# Patient Record
Sex: Female | Born: 1940
Health system: Southern US, Community
[De-identification: ages and names within clinical notes are randomized; demographics above are authoritative.]

## PROBLEM LIST (undated history)

## (undated) DIAGNOSIS — I773 Arterial fibromuscular dysplasia: Secondary | ICD-10-CM

## (undated) DIAGNOSIS — IMO0002 Reserved for concepts with insufficient information to code with codable children: Secondary | ICD-10-CM

## (undated) DIAGNOSIS — N943 Premenstrual tension syndrome: Secondary | ICD-10-CM

## (undated) DIAGNOSIS — H3561 Retinal hemorrhage, right eye: Secondary | ICD-10-CM

## (undated) DIAGNOSIS — M1990 Unspecified osteoarthritis, unspecified site: Secondary | ICD-10-CM

## (undated) DIAGNOSIS — I10 Essential (primary) hypertension: Secondary | ICD-10-CM

## (undated) HISTORY — DX: Retinal hemorrhage, right eye: H35.61

## (undated) HISTORY — PX: ANGIOPLASTY: SHX39

## (undated) HISTORY — DX: Essential (primary) hypertension: I10

## (undated) HISTORY — DX: Unspecified osteoarthritis, unspecified site: M19.90

## (undated) HISTORY — PX: TONSILLECTOMY AND ADENOIDECTOMY: SUR1326

## (undated) HISTORY — PX: OTHER SURGICAL HISTORY: SHX169

## (undated) HISTORY — DX: Premenstrual tension syndrome: N94.3

## (undated) HISTORY — PX: HEMORRHOID SURGERY: SHX153

## (undated) HISTORY — DX: Reserved for concepts with insufficient information to code with codable children: IMO0002

---

## 1998-09-26 ENCOUNTER — Ambulatory Visit (HOSPITAL_COMMUNITY): Admission: RE | Admit: 1998-09-26 | Discharge: 1998-09-26 | Payer: Self-pay | Admitting: Internal Medicine

## 1999-05-26 ENCOUNTER — Other Ambulatory Visit: Admission: RE | Admit: 1999-05-26 | Discharge: 1999-05-26 | Payer: Self-pay | Admitting: Family Medicine

## 2000-05-25 ENCOUNTER — Other Ambulatory Visit: Admission: RE | Admit: 2000-05-25 | Discharge: 2000-05-25 | Payer: Self-pay | Admitting: Family Medicine

## 2001-07-14 ENCOUNTER — Other Ambulatory Visit: Admission: RE | Admit: 2001-07-14 | Discharge: 2001-07-14 | Payer: Self-pay | Admitting: Family Medicine

## 2002-08-04 ENCOUNTER — Other Ambulatory Visit: Admission: RE | Admit: 2002-08-04 | Discharge: 2002-08-04 | Payer: Self-pay | Admitting: Family Medicine

## 2003-08-27 ENCOUNTER — Other Ambulatory Visit: Admission: RE | Admit: 2003-08-27 | Discharge: 2003-08-27 | Payer: Self-pay | Admitting: Family Medicine

## 2003-09-25 ENCOUNTER — Encounter: Admission: RE | Admit: 2003-09-25 | Discharge: 2003-09-25 | Payer: Self-pay | Admitting: Family Medicine

## 2004-07-29 ENCOUNTER — Ambulatory Visit: Payer: Self-pay | Admitting: Family Medicine

## 2004-08-14 ENCOUNTER — Ambulatory Visit: Payer: Self-pay | Admitting: Family Medicine

## 2004-08-14 ENCOUNTER — Other Ambulatory Visit: Admission: RE | Admit: 2004-08-14 | Discharge: 2004-08-14 | Payer: Self-pay | Admitting: Family Medicine

## 2004-08-19 ENCOUNTER — Ambulatory Visit: Payer: Self-pay | Admitting: Internal Medicine

## 2005-08-19 ENCOUNTER — Ambulatory Visit: Payer: Self-pay | Admitting: Family Medicine

## 2005-08-19 ENCOUNTER — Other Ambulatory Visit: Admission: RE | Admit: 2005-08-19 | Discharge: 2005-08-19 | Payer: Self-pay | Admitting: Family Medicine

## 2005-08-19 ENCOUNTER — Encounter: Payer: Self-pay | Admitting: Family Medicine

## 2005-09-30 ENCOUNTER — Ambulatory Visit (HOSPITAL_BASED_OUTPATIENT_CLINIC_OR_DEPARTMENT_OTHER): Admission: RE | Admit: 2005-09-30 | Discharge: 2005-10-01 | Payer: Self-pay | Admitting: *Deleted

## 2006-09-14 ENCOUNTER — Other Ambulatory Visit: Admission: RE | Admit: 2006-09-14 | Discharge: 2006-09-14 | Payer: Self-pay | Admitting: Family Medicine

## 2006-09-14 ENCOUNTER — Encounter: Payer: Self-pay | Admitting: Family Medicine

## 2006-09-14 ENCOUNTER — Ambulatory Visit: Payer: Self-pay | Admitting: Family Medicine

## 2006-09-14 LAB — CONVERTED CEMR LAB
ALT: 19 units/L (ref 0–40)
Albumin: 4.1 g/dL (ref 3.5–5.2)
BUN: 11 mg/dL (ref 6–23)
Basophils Relative: 0.1 % (ref 0.0–1.0)
CO2: 29 meq/L (ref 19–32)
Calcium: 10.4 mg/dL (ref 8.4–10.5)
Chloride: 110 meq/L (ref 96–112)
Creatinine, Ser: 0.8 mg/dL (ref 0.4–1.2)
Eosinophils Absolute: 0.1 10*3/uL (ref 0.0–0.6)
Hemoglobin: 14.5 g/dL (ref 12.0–15.0)
Hgb A1c MFr Bld: 5.3 % (ref 4.6–6.0)
LDL Cholesterol: 95 mg/dL (ref 0–99)
MCHC: 35.6 g/dL (ref 30.0–36.0)
MCV: 90.3 fL (ref 78.0–100.0)
Monocytes Relative: 5.4 % (ref 3.0–11.0)
Neutro Abs: 3.4 10*3/uL (ref 1.4–7.7)
RDW: 11.9 % (ref 11.5–14.6)
Sodium: 144 meq/L (ref 135–145)
TSH: 1.31 microintl units/mL (ref 0.35–5.50)
Triglycerides: 167 mg/dL — ABNORMAL HIGH (ref 0–149)

## 2007-02-24 DIAGNOSIS — M1990 Unspecified osteoarthritis, unspecified site: Secondary | ICD-10-CM | POA: Insufficient documentation

## 2007-02-24 DIAGNOSIS — J309 Allergic rhinitis, unspecified: Secondary | ICD-10-CM | POA: Insufficient documentation

## 2007-02-24 DIAGNOSIS — M159 Polyosteoarthritis, unspecified: Secondary | ICD-10-CM | POA: Insufficient documentation

## 2007-09-29 ENCOUNTER — Encounter: Payer: Self-pay | Admitting: Family Medicine

## 2007-09-29 ENCOUNTER — Other Ambulatory Visit: Admission: RE | Admit: 2007-09-29 | Discharge: 2007-09-29 | Payer: Self-pay | Admitting: Family Medicine

## 2007-09-29 ENCOUNTER — Ambulatory Visit: Payer: Self-pay | Admitting: Family Medicine

## 2007-09-29 DIAGNOSIS — M818 Other osteoporosis without current pathological fracture: Secondary | ICD-10-CM

## 2007-09-29 DIAGNOSIS — I10 Essential (primary) hypertension: Secondary | ICD-10-CM | POA: Insufficient documentation

## 2007-09-29 DIAGNOSIS — M8580 Other specified disorders of bone density and structure, unspecified site: Secondary | ICD-10-CM | POA: Insufficient documentation

## 2007-09-29 DIAGNOSIS — M810 Age-related osteoporosis without current pathological fracture: Secondary | ICD-10-CM | POA: Insufficient documentation

## 2007-09-29 LAB — CONVERTED CEMR LAB
ALT: 16 units/L (ref 0–35)
Albumin: 4.5 g/dL (ref 3.5–5.2)
Alkaline Phosphatase: 57 units/L (ref 39–117)
BUN: 17 mg/dL (ref 6–23)
Basophils Relative: 1 % (ref 0.0–1.0)
Bilirubin Urine: NEGATIVE
Bilirubin, Direct: 0.2 mg/dL (ref 0.0–0.3)
Cholesterol: 187 mg/dL (ref 0–200)
Creatinine, Ser: 0.8 mg/dL (ref 0.4–1.2)
Eosinophils Absolute: 0.1 10*3/uL (ref 0.0–0.6)
Eosinophils Relative: 2.4 % (ref 0.0–5.0)
Glucose, Urine, Semiquant: NEGATIVE
HDL: 44.5 mg/dL (ref >39.0)
Ketones, urine, test strip: NEGATIVE
LDL Cholesterol: 122 mg/dL — ABNORMAL HIGH (ref 0–99)
Monocytes Absolute: 0.3 10*3/uL (ref 0.2–0.7)
Monocytes Relative: 8 % (ref 3.0–11.0)
Neutro Abs: 2.5 10*3/uL (ref 1.4–7.7)
Neutrophils Relative %: 62.2 % (ref 43.0–77.0)
Nitrite: NEGATIVE
Platelets: 240 10*3/uL (ref 150–400)
Protein, U semiquant: NEGATIVE
RDW: 11.8 % (ref 11.5–14.6)
Total CHOL/HDL Ratio: 4.2
Total Protein: 6.6 g/dL (ref 6.0–8.3)
Triglycerides: 104 mg/dL (ref 0–149)

## 2007-12-12 ENCOUNTER — Encounter: Admission: RE | Admit: 2007-12-12 | Discharge: 2007-12-12 | Payer: Self-pay | Admitting: Family Medicine

## 2008-10-23 ENCOUNTER — Encounter: Payer: Self-pay | Admitting: Family Medicine

## 2008-10-23 ENCOUNTER — Ambulatory Visit: Payer: Self-pay | Admitting: Family Medicine

## 2008-10-23 ENCOUNTER — Other Ambulatory Visit: Admission: RE | Admit: 2008-10-23 | Discharge: 2008-10-23 | Payer: Self-pay | Admitting: Family Medicine

## 2008-10-23 DIAGNOSIS — H3530 Unspecified macular degeneration: Secondary | ICD-10-CM | POA: Insufficient documentation

## 2008-10-23 LAB — CONVERTED CEMR LAB
Ketones, urine, test strip: NEGATIVE
Nitrite: NEGATIVE
Protein, U semiquant: NEGATIVE
Specific Gravity, Urine: 1.02
Urobilinogen, UA: 0.2
WBC Urine, dipstick: NEGATIVE
pH: 5.5

## 2008-11-05 ENCOUNTER — Ambulatory Visit: Payer: Self-pay | Admitting: Family Medicine

## 2008-11-05 LAB — CONVERTED CEMR LAB: PTH: 98 pg/mL — ABNORMAL HIGH (ref 14.0–72.0)

## 2008-11-12 ENCOUNTER — Telehealth: Payer: Self-pay | Admitting: Family Medicine

## 2009-01-10 ENCOUNTER — Encounter: Admission: RE | Admit: 2009-01-10 | Discharge: 2009-01-10 | Payer: Self-pay | Admitting: Family Medicine

## 2009-02-25 LAB — CONVERTED CEMR LAB
ALT: 18 units/L (ref 0–35)
AST: 29 units/L (ref 0–37)
Albumin: 4.6 g/dL (ref 3.5–5.2)
Alkaline Phosphatase: 60 units/L (ref 39–117)
CO2: 32 meq/L (ref 19–32)
Calcium: 11.8 mg/dL — ABNORMAL HIGH (ref 8.4–10.5)
Chloride: 106 meq/L (ref 96–112)
Cholesterol: 185 mg/dL (ref 0–200)
Eosinophils Absolute: 0.1 10*3/uL (ref 0.0–0.7)
GFR calc non Af Amer: 87.97 mL/min (ref >60)
Glucose, Bld: 106 mg/dL — ABNORMAL HIGH (ref 70–99)
HCT: 44.7 % (ref 36.0–46.0)
Hemoglobin: 15.6 g/dL — ABNORMAL HIGH (ref 12.0–15.0)
LDL Cholesterol: 116 mg/dL — ABNORMAL HIGH (ref 0–99)
Lymphocytes Relative: 17.6 % (ref 12.0–46.0)
MCHC: 34.8 g/dL (ref 30.0–36.0)
Monocytes Absolute: 0.4 10*3/uL (ref 0.1–1.0)
Monocytes Relative: 6.2 % (ref 3.0–12.0)
Potassium: 3.9 meq/L (ref 3.5–5.1)
RBC: 4.87 M/uL (ref 3.87–5.11)
TSH: 1.11 microintl units/mL (ref 0.35–5.50)
Total CHOL/HDL Ratio: 4
Total Protein: 7.2 g/dL (ref 6.0–8.3)
VLDL: 25.6 mg/dL (ref 0.0–40.0)

## 2009-10-14 ENCOUNTER — Encounter (INDEPENDENT_AMBULATORY_CARE_PROVIDER_SITE_OTHER): Payer: Self-pay | Admitting: *Deleted

## 2009-10-24 ENCOUNTER — Ambulatory Visit: Payer: Self-pay | Admitting: Family Medicine

## 2009-10-24 ENCOUNTER — Other Ambulatory Visit: Admission: RE | Admit: 2009-10-24 | Discharge: 2009-10-24 | Payer: Self-pay | Admitting: Family Medicine

## 2009-10-24 LAB — CONVERTED CEMR LAB
Bilirubin Urine: NEGATIVE
Glucose, Urine, Semiquant: NEGATIVE
Pap Smear: NEGATIVE
pH: 6.5

## 2009-10-24 LAB — HM PAP SMEAR

## 2009-10-25 LAB — CONVERTED CEMR LAB
AST: 19 units/L (ref 0–37)
BUN: 17 mg/dL (ref 6–23)
Basophils Absolute: 0 10*3/uL (ref 0.0–0.1)
Chloride: 103 meq/L (ref 96–112)
Cholesterol: 187 mg/dL (ref 0–200)
Direct LDL: 119.4 mg/dL
Eosinophils Absolute: 0.1 10*3/uL (ref 0.0–0.7)
Eosinophils Relative: 2.8 % (ref 0.0–5.0)
GFR calc non Af Amer: 65.63 mL/min (ref >60)
HCT: 41.8 % (ref 36.0–46.0)
HDL: 46.8 mg/dL (ref >39.00)
Hemoglobin: 14.6 g/dL (ref 12.0–15.0)
MCHC: 35 g/dL (ref 30.0–36.0)
MCV: 90.7 fL (ref 78.0–100.0)
Platelets: 240 10*3/uL (ref 150.0–400.0)
Potassium: 4.1 meq/L (ref 3.5–5.1)
RBC: 4.61 M/uL (ref 3.87–5.11)
RDW: 12.4 % (ref 11.5–14.6)
Sodium: 143 meq/L (ref 135–145)
Total Bilirubin: 1 mg/dL (ref 0.3–1.2)
Total CHOL/HDL Ratio: 4
Triglycerides: 210 mg/dL — ABNORMAL HIGH (ref 0.0–149.0)
VLDL: 42 mg/dL — ABNORMAL HIGH (ref 0.0–40.0)
WBC: 4.2 10*3/uL — ABNORMAL LOW (ref 4.5–10.5)

## 2009-10-28 ENCOUNTER — Ambulatory Visit: Payer: Self-pay | Admitting: Family Medicine

## 2009-10-28 LAB — CONVERTED CEMR LAB
Calcium, Total (PTH): 9.8 mg/dL (ref 8.4–10.5)
PTH: 107.7 pg/mL — ABNORMAL HIGH (ref 14.0–72.0)

## 2010-02-19 ENCOUNTER — Encounter: Admission: RE | Admit: 2010-02-19 | Discharge: 2010-02-19 | Payer: Self-pay | Admitting: Family Medicine

## 2010-02-19 LAB — HM MAMMOGRAPHY

## 2010-04-02 ENCOUNTER — Encounter (INDEPENDENT_AMBULATORY_CARE_PROVIDER_SITE_OTHER): Payer: Self-pay | Admitting: *Deleted

## 2010-05-05 ENCOUNTER — Encounter (INDEPENDENT_AMBULATORY_CARE_PROVIDER_SITE_OTHER): Payer: Self-pay | Admitting: *Deleted

## 2010-05-07 ENCOUNTER — Ambulatory Visit: Payer: Self-pay | Admitting: Internal Medicine

## 2010-05-21 ENCOUNTER — Ambulatory Visit: Payer: Self-pay | Admitting: Internal Medicine

## 2010-08-17 ENCOUNTER — Encounter: Payer: Self-pay | Admitting: Family Medicine

## 2010-08-26 NOTE — Procedures (Signed)
Summary: Colonoscopy  Patient: Michelle [name] Note: All result statuses are Final unless otherwise noted.  Tests: (1) Colonoscopy (COL)   COL Colonoscopy           DONE     [facility]     [address]     [place], [place]  [address]           COLONOSCOPY PROCEDURE REPORT           PATIENT:  [name], [name]  MR#:  [id number]     BIRTHDATE:  Jun 25, 1941, 69 yrs. old  GENDER:  female     ENDOSCOPIST:  Hedwig Morton. Juanda Chance, MD     REF. BY:  Tinnie Gens A. Tawanna Cooler, M.D.     PROCEDURE DATE:  05/21/2010     PROCEDURE:  Colonoscopy 19147     ASA CLASS:  Class II     INDICATIONS:  adenom. polyp 1999, last colon 2004     MEDICATIONS:   Versed 8 mg, Fentanyl 75 mcg           DESCRIPTION OF PROCEDURE:   After the risks benefits and     alternatives of the procedure were thoroughly explained, informed     consent was obtained.  Digital rectal exam was performed and     revealed no rectal masses.   The LB [id number]-H180AL [id number] endoscope     was introduced through the anus and advanced to the cecum, which     was identified by both the appendix and ileocecal valve, without     limitations.  The quality of the prep was Miralax fair.  The     instrument was then slowly withdrawn as the colon was fully     examined.     <<PROCEDUREIMAGES>>           FINDINGS:  Mild diverticulosis was found (see image5 and image4).     Retroflexed views in the rectum revealed no abnormalities.    The     scope was then withdrawn from the patient and the procedure     completed.           COMPLICATIONS:  None     ENDOSCOPIC IMPRESSION:     1) Mild diverticulosis     RECOMMENDATIONS:     1) high fiber diet     REPEAT EXAM:  No           ______________________________     Hedwig Morton. Juanda Chance, MD           CC:           n.     eSIGNED:   Hedwig Morton. Brodie at 05/21/2010 11:09 AM           Mariann Laster, [id number]  Note: An exclamation mark (!) indicates a result that was not dispersed into the flowsheet. Document  Creation Date: 05/21/2010 11:09 AM _______________________________________________________________________  (1) Order result status: Final Collection or observation date-time: 05/21/2010 11:00 Requested date-time:  Receipt date-time:  Reported date-time:  Referring Physician:   Ordering Physician: Lina Sar [phone]) Specimen Source:  Source: Launa Grill Order Number: [id number] Lab site:

## 2010-08-26 NOTE — Assessment & Plan Note (Signed)
Summary: cpx/ pt will be fasting/mm   Vital Signs:  Patient profile:   70 year old female Menstrual status:  postmenopausal Height:      64 inches Weight:      170 pounds BMI:     29.29 Temp:     98.0 degrees F oral BP sitting:   130 / 84  (left arm) Cuff size:   regular  Vitals Entered By: Kern Reap CMA Duncan Dull) (October 24, 2009 8:35 AM) CC: cpx Is Patient Diabetic? No Pain Assessment Patient in pain? no        CC:  cpx.  History of Present Illness: Michelle [name] is a 71 year old, married female, nonsmoker, who comes in today for evaluation of hypertension, allergic rhinitis, and decide chronic eczema.  Her hypertension is treated with Zestril 5 mg daily and Spiril. 25 mg daily.  She has a history of renal artery stenosis, and many years ago, had a renal artery dilated.  Her BP has been stable on this medication for many years.  She also has underlying allergic rhinitis, for which she takes Zyrtec 10 mg nightly  Jones, uses Lidex cream p.r.n. for eczema.  Visual acuity, right eye, 2020, left eye.  She has significant vision loss.  However, Dr. Ashley Royalty, her ophthalmologist has been treating her with Avastin injectable for the last two years and this is preserved.  Some of the vision in her right eye.  Diagnosis macular degeneration.  Her past medical history, social history, family history reviewed in detail.  No significant changes.  She continues to walk daily.  Her mood is good.  Hearing normal ADLs, normal risk factor fall reviewed negative.  A home safety reviewed.  Height weight and vision.  Normal except for noted above.  We discussed end of life.  No code blue healthcare power of attorney, etc.  Loney Laurence will be done today.  She also gets routine dental care does BSE monthly.  Gets annual mammography.  Colonoscopy normal.  GI, tetanus 2004, Pneumovax 2010 seasonal flu 2000 and she will consider the shingles  Allergies: 1)  ! * Ivp Dye 2)  ! Benadryl  Past  History:  Past medical, surgical, family and social histories (including risk factors) reviewed, and no changes noted (except as noted below).  Past Medical History: Reviewed history from 09/29/2007 and no changes required. PMS Allergic rhinitis Osteoarthritis hypertension right retinal hemorrhage with macular degeneration osteoporosis cystocele childbirth x 3.  One child died in motor vehicle accident  Past Surgical History: Reviewed history from 02/24/2007 and no changes required. CB x 3 Hemorrhoidectomy T&A RT Ovarian cyst removed BTL Angioplasty Colonoscopy-10/30/2002  Family History: Reviewed history from 09/29/2007 and no changes required. Family History Hypertension  Social History: Reviewed history from 09/29/2007 and no changes required. Retired Married Never Smoked Alcohol use-no Drug use-no Regular exercise-yes  Review of Systems      See HPI  Physical Exam  General:  Well-developed,well-nourished,in no acute distress; alert,appropriate and cooperative throughout examination Head:  Normocephalic and atraumatic without obvious abnormalities. No apparent alopecia or balding. Eyes:  No corneal or conjunctival inflammation noted. EOMI. Perrla. Funduscopic exam benign, without hemorrhages, exudates or papilledema. Vision grossly normal. Ears:  External ear exam shows no significant lesions or deformities.  Otoscopic examination reveals clear canals, tympanic membranes are intact bilaterally without bulging, retraction, inflammation or discharge. Hearing is grossly normal bilaterally. Nose:  External nasal examination shows no deformity or inflammation. Nasal mucosa are pink and moist without lesions or exudates. Mouth:  Oral  mucosa and oropharynx without lesions or exudates.  Teeth in good repair. Neck:  No deformities, masses, or tenderness noted. Chest Wall:  No deformities, masses, or tenderness noted. Breasts:  No mass, nodules, thickening, tenderness,  bulging, retraction, inflamation, nipple discharge or skin changes noted.   Lungs:  Normal respiratory effort, chest expands symmetrically. Lungs are clear to auscultation, no crackles or wheezes. Heart:  Normal rate and regular rhythm. S1 and S2 normal without gallop, murmur, click, rub or other extra sounds. Abdomen:  Bowel sounds positive,abdomen soft and non-tender without masses, organomegaly or hernias noted. Rectal:  No external abnormalities noted. Normal sphincter tone. No rectal masses or tenderness. Genitalia:  Pelvic Exam:        External: normal female genitalia without lesions or masses        Vagina: normal without lesions or masses        Cervix: normal without lesions or masses        Adnexa: normal bimanual exam without masses or fullness        Uterus: normal by palpation        Pap smear: performed Msk:  No deformity or scoliosis noted of thoracic or lumbar spine.   Pulses:  R and L carotid,radial,femoral,dorsalis pedis and posterior tibial pulses are full and equal bilaterally Extremities:  No clubbing, cyanosis, edema, or deformity noted with normal full range of motion of all joints.   Neurologic:  No cranial nerve deficits noted. Station and gait are normal. Plantar reflexes are down-going bilaterally. DTRs are symmetrical throughout. Sensory, motor and coordinative functions appear intact. Skin:  Intact without suspicious lesions or rashes Cervical Nodes:  No lymphadenopathy noted Axillary Nodes:  No palpable lymphadenopathy Inguinal Nodes:  No significant adenopathy Psych:  Cognition and judgment appear intact. Alert and cooperative with normal attention span and concentration. No apparent delusions, illusions, hallucinations   Impression & Recommendations:  Problem # 1:  OTHER OSTEOPOROSIS (ICD-733.09) Assessment Improved  Problem # 2:  ALLERGIC RHINITIS (ICD-477.9) Assessment: Improved  Her updated medication list for this problem includes:    Zyrtec  Allergy 10 Mg Tabs (Cetirizine hcl) .Marland Kitchen... As needed  Orders: Venipuncture (03474) TLB-Lipid Panel (80061-LIPID) TLB-BMP (Basic Metabolic Panel-BMET) (80048-METABOL) TLB-CBC Platelet - w/Differential (85025-CBCD) TLB-Hepatic/Liver Function Pnl (80076-HEPATIC) TLB-TSH (Thyroid Stimulating Hormone) (25956-LOV) Prescription Created Electronically [phone]) Subsequent annual wellness visit with prevention plan (I9518) UA Dipstick w/o Micro (automated)  (81003)  Problem # 3:  Preventive Health Care (ICD-V70.0) Assessment: Unchanged  Orders: Venipuncture (84166) TLB-Lipid Panel (80061-LIPID) TLB-BMP (Basic Metabolic Panel-BMET) (80048-METABOL) TLB-CBC Platelet - w/Differential (85025-CBCD) TLB-Hepatic/Liver Function Pnl (80076-HEPATIC) TLB-TSH (Thyroid Stimulating Hormone) (06301-SWF) Prescription Created Electronically [phone]) Subsequent annual wellness visit with prevention plan (F5732) UA Dipstick w/o Micro (automated)  (81003)  Complete Medication List: 1)  Zestril 5 Mg Tabs (Lisinopril) .... Take 1 tablet by mouth once a day 2)  Spironolactone 25 Mg Tabs (Spironolactone) .... Take 1 tablet by mouth once a day 3)  Zyrtec Allergy 10 Mg Tabs (Cetirizine hcl) .... As needed 4)  Lidex 0.05 % Crea (Fluocinonide) .... Apply at bedtime 5)  Avastin 100 Mg/5ml Soln (Bevacizumab) .... For eyes  Other Orders: EKG w/ Interpretation (93000) Prescriptions: LIDEX 0.05 %  CREA (FLUOCINONIDE) apply at bedtime  #60 gr x 3   Entered and Authorized by:   Roderick Pee MD   Signed by:   Roderick Pee MD on 10/24/2009   Method used:   Electronically to        [facility]* (mail-order)             ,          Ph: [phone]       Fax: [phone]   RxID:   [id number] SPIRONOLACTONE 25 MG  TABS (SPIRONOLACTONE) Take 1 tablet by mouth once a day  #100 x 4   Entered and Authorized by:   Roderick Pee MD   Signed by:   Roderick Pee MD on 10/24/2009   Method used:   Electronically to         [facility]* (mail-order)             ,          Ph: [phone]       Fax: [phone]   RxID:   [id number] ZESTRIL 5 MG  TABS (LISINOPRIL) Take 1 tablet by mouth once a day  #100 x 4   Entered and Authorized by:   Roderick Pee MD   Signed by:   Roderick Pee MD on 10/24/2009   Method used:   Electronically to        [facility]* (mail-order)             ,          Ph: [phone]       Fax: [phone]   RxID:   [id number]    Immunization History:  Influenza Immunization History:    Influenza:  historical (04/26/2009)   Laboratory Results   Urine Tests    Routine Urinalysis   Color: yellow Appearance: Clear Glucose: negative   (Normal Range: Negative) Bilirubin: negative   (Normal Range: Negative) Ketone: negative   (Normal Range: Negative) Spec. Gravity: 1.015   (Normal Range: 1.003-1.035) Blood: negative   (Normal Range: Negative) pH: 6.5   (Normal Range: 5.0-8.0) Protein: negative   (Normal Range: Negative) Urobilinogen: 0.2   (Normal Range: 0-1) Nitrite: negative   (Normal Range: Negative) Leukocyte Esterace: negative   (Normal Range: Negative)    Comments: Rita Ohara  October 24, 2009 11:00 AM

## 2010-08-26 NOTE — Miscellaneous (Signed)
Summary: [facility]  Clinical Lists Changes  Medications: Added new medication of MIRALAX   POWD (POLYETHYLENE GLYCOL 3350) As per prep  instructions. - Signed Added new medication of DULCOLAX 5 MG  TBEC (BISACODYL) Day before procedure take 2 at 3pm and 2 at 8pm. - Signed Added new medication of REGLAN 10 MG  TABS (METOCLOPRAMIDE HCL) As per prep instructions. - Signed Rx of MIRALAX   POWD (POLYETHYLENE GLYCOL 3350) As per prep  instructions.;  #255gm x 0;  Signed;  Entered by: Durwin Glaze RN;  Authorized by: Hart Carwin MD;  Method used: Electronically to [facility] [address] [place], [place], [place]  [address], Ph: [phone], Fax: [phone] Rx of DULCOLAX 5 MG  TBEC (BISACODYL) Day before procedure take 2 at 3pm and 2 at 8pm.;  #4 x 0;  Signed;  Entered by: Durwin Glaze RN;  Authorized by: Hart Carwin MD;  Method used: Electronically to [facility] [address] [place], [place], [place]  [address], Ph: [phone], Fax: [phone] Rx of REGLAN 10 MG  TABS (METOCLOPRAMIDE HCL) As per prep instructions.;  #2 x 0;  Signed;  Entered by: Durwin Glaze RN;  Authorized by: Hart Carwin MD;  Method used: Electronically to [facility] [address] [place], [place], [place]  [address], Ph: [phone], Fax: [phone] Observations: Added new observation of ALLERGY REV: Done (05/07/2010 8:21)    Prescriptions: REGLAN 10 MG  TABS (METOCLOPRAMIDE HCL) As per prep instructions.  #2 x 0   Entered by:   Durwin Glaze RN   Authorized by:   Hart Carwin MD   Signed by:   Durwin Glaze RN on 05/07/2010   Method used:   Electronically to        [facility] (retail)       [address]       [place], [place]  [address]       Ph: [phone]       Fax: [phone]   RxID:   [id number] DULCOLAX 5 MG  TBEC (BISACODYL) Day before procedure take 2 at 3pm and 2 at 8pm.  #4 x 0   Entered by:   Durwin Glaze RN  Authorized by:   Hart Carwin MD   Signed by:   Durwin Glaze RN on 05/07/2010   Method used:   Electronically to        [facility] (retail)       [address]       [place], [place]  [address]       Ph: [phone]       Fax: [phone]   RxID:   [id number] MIRALAX   POWD (POLYETHYLENE GLYCOL 3350) As per prep  instructions.  #255gm x 0   Entered by:   Durwin Glaze RN   Authorized by:   Hart Carwin MD   Signed by:   Durwin Glaze RN on 05/07/2010   Method used:   Electronically to        [facility] (retail)       [address]       [place], [place]  [address]       Ph: [phone]       Fax: [phone]  RxID:   [id number]

## 2010-08-26 NOTE — Letter (Signed)
Summary: [facility] Instructions  [facility]  [address] [place], [place] [address]   Phone: [phone]  Fax: [phone]       Michelle [name]    1941-03-27    MRN: [id number]       Procedure Day Dorna Bloom:  Wednesday 05/21/2010     Arrival Time: 9:00 am     Procedure Time: 10:00 am     Location of Procedure:                    _x _  [facility] (4th Floor)    PREPARATION FOR COLONOSCOPY WITH MIRALAX  Starting 5 days prior to your procedure Friday 10/21 do not eat nuts, seeds, popcorn, corn, beans, peas,  salads, or any raw vegetables.  Do not take any fiber supplements (e.g. Metamucil, Citrucel, and Benefiber). ____________________________________________________________________________________________________   THE DAY BEFORE YOUR PROCEDURE         DATE: Tuesday 10/25  1   Drink clear liquids the entire day-NO SOLID FOOD  2   Do not drink anything colored red or purple.  Avoid juices with pulp.  No orange juice.  3   Drink at least 64 oz. (8 glasses) of fluid/clear liquids during the day to prevent dehydration and help the prep work efficiently.  CLEAR LIQUIDS INCLUDE: Water Jello Ice Popsicles Tea (sugar ok, no milk/cream) Powdered fruit flavored drinks Coffee (sugar ok, no milk/cream) Gatorade Juice: apple, white grape, white cranberry  Lemonade Clear bullion, consomm, broth Carbonated beverages (any kind) Strained chicken noodle soup Hard Candy  4   Mix the entire bottle of Miralax with 64 oz. of Gatorade/Powerade in the morning and put in the refrigerator to chill.  5   At 3:00 pm take 2 Dulcolax/Bisacodyl tablets.  6   At 4:30 pm take one Reglan/Metoclopramide tablet.  7  Starting at 5:00 pm drink one 8 oz glass of the Miralax mixture every 15-20 minutes until you have finished drinking the entire 64 oz.  You should finish drinking prep around 7:30 or 8:00 pm.  8   If you are nauseated, you may take the 2nd Reglan/Metoclopramide  tablet at 6:30 pm.        9    At 8:00 pm take 2 more DULCOLAX/Bisacodyl tablets.     THE DAY OF YOUR PROCEDURE      DATE:  Wednesday 10/26  You may drink clear liquids until 8:00 am  (2 HOURS BEFORE PROCEDURE).   MEDICATION INSTRUCTIONS  Unless otherwise instructed, you should take regular prescription medications with a small sip of water as early as possible the morning of your procedure.        OTHER INSTRUCTIONS  You will need a responsible adult at least 72 years of age to accompany you and drive you home.   This person must remain in the waiting room during your procedure.  Wear loose fitting clothing that is easily removed.  Leave jewelry and other valuables at home.  However, you may wish to bring a book to read or an iPod/MP3 player to listen to music as you wait for your procedure to start.  Remove all body piercing jewelry and leave at home.  Total time from sign-in until discharge is approximately 2-3 hours.  You should go home directly after your procedure and rest.  You can resume normal activities the day after your procedure.  The day of your procedure you should not:   Drive   Make legal  decisions   Operate machinery   Drink alcohol   Return to work  You will receive specific instructions about eating, activities and medications before you leave.   The above instructions have been reviewed and explained to me by   Durwin Glaze RN  May 07, 2010 8:49 AM     I fully understand and can verbalize these instructions _____________________________ Date _______

## 2010-08-26 NOTE — Letter (Signed)
Summary: Colonoscopy Letter  [facility]  [address] [place], [place] [address]   Phone: [phone]  Fax: [phone]      October 14, 2009 MRN: [id number]   [name] [address] [place], [place]  [address]   Dear Ms. Michelle [name],   According to your medical record, it is time for you to schedule a Colonoscopy. The American Cancer Society recommends this procedure as a method to detect early colon cancer. Patients with a family history of colon cancer, or a personal history of colon polyps or inflammatory bowel disease are at increased risk.  This letter has beeen generated based on the recommendations made at the time of your procedure. If you feel that in your particular situation this may no longer apply, please contact our office.  Please call our office at [phone] to schedule this appointment or to update your records at your earliest convenience.  Thank you for cooperating with [place] to provide you with the very best care possible.   Sincerely,  Hedwig Morton. Juanda Chance, M.D.  [facility] [phone]

## 2010-08-26 NOTE — Letter (Signed)
Summary: Pre Visit Letter Revised  [facility]  [address] [place], [place] [address]   Phone: [phone]  Fax: [phone]        04/02/2010 MRN: [id number] Michelle [name] [address] [place], [place]  [address]             Procedure Date:  05-21-10   Welcome to the Gastroenterology Division at [facility].    You are scheduled to see a nurse for your pre-procedure visit on 05-07-10 at 8:30a.m. on the 3rd floor at [facility], [address]  We ask that you try to arrive at our office 15 minutes prior to your appointment time to allow for check-in.  Please take a minute to review the attached form.  If you answer "Yes" to one or more of the questions on the first page, we ask that you call the person listed at your earliest opportunity.  If you answer "No" to all of the questions, please complete the rest of the form and bring it to your appointment.    Your nurse visit will consist of discussing your medical and surgical history, your immediate family medical history, and your medications.   If you are unable to list all of your medications on the form, please bring the medication bottles to your appointment and we will list them.  We will need to be aware of both prescribed and over the counter drugs.  We will need to know exact dosage information as well.    Please be prepared to read and sign documents such as consent forms, a financial agreement, and acknowledgement forms.  If necessary, and with your consent, a friend or relative is welcome to sit-in on the nurse visit with you.  Please bring your insurance card so that we may make a copy of it.  If your insurance requires a referral to see a specialist, please bring your referral form from your primary care physician.  No co-pay is required for this nurse visit.     If you cannot keep your appointment, please call [phone] to cancel or reschedule prior to your appointment date.  This allows [place]  the opportunity to schedule an appointment for another patient in need of care.    Thank you for choosing [facility] for your medical needs.  We appreciate the opportunity to care for you.  Please visit [place] at our website  to learn more about our practice.  Sincerely, The Gastroenterology Division

## 2010-09-29 ENCOUNTER — Telehealth: Payer: Self-pay | Admitting: Family Medicine

## 2010-09-29 NOTE — Telephone Encounter (Signed)
Pt has sch her mcr physical for 10/27/10 at 2:45pm. Pt is sch on lab sch that same day 10/27/10 at 10am. Need to get code for medicare labs, so pts insurance will cover. Pls advise.

## 2010-09-29 NOTE — Telephone Encounter (Signed)
We will use hypertension.  patient  Is aware

## 2010-10-24 ENCOUNTER — Encounter: Payer: Self-pay | Admitting: Family Medicine

## 2010-10-27 ENCOUNTER — Ambulatory Visit (INDEPENDENT_AMBULATORY_CARE_PROVIDER_SITE_OTHER): Payer: Medicare Other | Admitting: Family Medicine

## 2010-10-27 ENCOUNTER — Other Ambulatory Visit (INDEPENDENT_AMBULATORY_CARE_PROVIDER_SITE_OTHER): Payer: Medicare Other | Admitting: Family Medicine

## 2010-10-27 ENCOUNTER — Encounter: Payer: Self-pay | Admitting: Family Medicine

## 2010-10-27 VITALS — BP 118/80 | Temp 98.3°F | Ht 64.0 in | Wt 172.0 lb

## 2010-10-27 DIAGNOSIS — M1990 Unspecified osteoarthritis, unspecified site: Secondary | ICD-10-CM

## 2010-10-27 DIAGNOSIS — I10 Essential (primary) hypertension: Secondary | ICD-10-CM

## 2010-10-27 DIAGNOSIS — E010 Iodine-deficiency related diffuse (endemic) goiter: Secondary | ICD-10-CM

## 2010-10-27 DIAGNOSIS — IMO0002 Reserved for concepts with insufficient information to code with codable children: Secondary | ICD-10-CM

## 2010-10-27 LAB — CBC WITH DIFFERENTIAL/PLATELET
Basophils Relative: 0.9 % (ref 0.0–3.0)
Eosinophils Absolute: 0.1 10*3/uL (ref 0.0–0.7)
Eosinophils Relative: 3.9 % (ref 0.0–5.0)
HCT: 42.7 % (ref 36.0–46.0)
Hemoglobin: 14.9 g/dL (ref 12.0–15.0)
Lymphs Abs: 1 10*3/uL (ref 0.7–4.0)
MCV: 91.8 fl (ref 78.0–100.0)
RBC: 4.65 Mil/uL (ref 3.87–5.11)

## 2010-10-27 LAB — POCT URINALYSIS DIPSTICK
Blood, UA: NEGATIVE
Nitrite, UA: NEGATIVE
Protein, UA: NEGATIVE
Urobilinogen, UA: 0.2
pH, UA: 7

## 2010-10-27 LAB — BASIC METABOLIC PANEL
BUN: 15 mg/dL (ref 6–23)
CO2: 28 mEq/L (ref 19–32)
Calcium: 9.9 mg/dL (ref 8.4–10.5)
GFR: 88.93 mL/min (ref >60.00)
Sodium: 136 mEq/L (ref 135–145)

## 2010-10-27 LAB — LIPID PANEL
Cholesterol: 173 mg/dL (ref 0–200)
HDL: 40.7 mg/dL (ref >39.00)
Triglycerides: 216 mg/dL — ABNORMAL HIGH (ref 0.0–149.0)
VLDL: 43.2 mg/dL — ABNORMAL HIGH (ref 0.0–40.0)

## 2010-10-27 LAB — HEPATIC FUNCTION PANEL: Albumin: 4.3 g/dL (ref 3.5–5.2)

## 2010-10-27 MED ORDER — SPIRONOLACTONE 25 MG PO TABS: 25.0000 mg | ORAL_TABLET | Freq: Every day | ORAL | Status: DC

## 2010-10-27 MED ORDER — LISINOPRIL 5 MG PO TABS: 5.0000 mg | ORAL_TABLET | Freq: Every day | ORAL | Status: DC

## 2010-10-27 NOTE — Progress Notes (Signed)
  Subjective:    Patient ID: Michelle [name], female    DOB: 1940-12-31, 70 y.o.   MRN: [id number]  HPIPatricia is a 70 year old, married female, nonsmoker, who comes in today for evaluation of hypertension.  She said a history of underlying hypertension, secondary to renal artery stenosis.  30 years ago.  She had a renal artery dilated and since that time.  Her blood pressures done amazingly well controlled on 5 mg of lisinopril and 25 mg of spironolactone.  BP today 118/80.  She continues to get injections of Avastin in her eye because of macular degeneration.  She takes OTC, Zyrtec for allergic rhinitis.  She gets routine eye care.  Dental care.  BSE monthly and he mammography.  Colonoscopy two years ago, normal.  Tetanus 2004 Pneumovax x 2    Review of Systems  Constitutional: Negative.   HENT: Negative.   Eyes: Negative.   Respiratory: Negative.   Cardiovascular: Negative.   Gastrointestinal: Negative.   Genitourinary: Negative.   Musculoskeletal: Negative.   Neurological: Negative.   Hematological: Negative.   Psychiatric/Behavioral: Negative.        Objective:   Physical Exam  Constitutional: She appears well-developed and well-nourished.  HENT:  Head: Normocephalic and atraumatic.  Right Ear: External ear normal.  Left Ear: External ear normal.  Nose: Nose normal.  Mouth/Throat: Oropharynx is clear and moist.  Eyes: EOM are normal. Pupils are equal, round, and reactive to light.  Neck: Normal range of motion. Neck supple. No thyromegaly present.  Cardiovascular: Normal rate, regular rhythm, normal heart sounds and intact distal pulses.  Exam reveals no gallop and no friction rub.   No murmur heard. Pulmonary/Chest: Effort normal and breath sounds normal.  Abdominal: Soft. Bowel sounds are normal. She exhibits no distension and no mass. There is no tenderness. There is no rebound.  Genitourinary:       Bilateral breast exam normal  Musculoskeletal: Normal range  of motion.  Lymphadenopathy:    She has no cervical adenopathy.  Neurological: She is alert. She has normal reflexes. No cranial nerve deficit. She exhibits normal muscle tone. Coordination normal.  Skin: Skin is warm and dry.  Psychiatric: She has a normal mood and affect. Her behavior is normal. Judgment and thought content normal.          Assessment & Plan:  Hypertension,,,,,,,,,,, continue current medications follow-up in one year

## 2010-10-27 NOTE — Patient Instructions (Signed)
Continue current medications follow-up in one year 

## 2010-12-12 NOTE — Op Note (Signed)
NAMEQUINCY, Michelle [name]               ACCOUNT NO.:  [id number]   MEDICAL RECORD NO.:  [id number]          PATIENT TYPE:  AMB   LOCATION:  DSC                          FACILITY:  [facility]   PHYSICIAN:  Tennis Must Meyerdierks, M.D.DATE OF BIRTH:  Jan 27, 1941   DATE OF PROCEDURE:  09/30/2005  DATE OF DISCHARGE:                                 OPERATIVE REPORT   PREOP DIAGNOSIS:  Pantrapezial arthritis, right thumb.   POSTOP DIAGNOSIS:  Pantrapezial arthritis, right thumb.   PROCEDURE:  Excision of trapezium with FCR anchovy arthroplasty and APL  suspension plasty, right thumb.   SURGEON:  Dr. Metro Kung.   ANESTHESIA:  General.   OPERATIVE FINDINGS:  The patient had erosive arthritis with pantrapezial  changes. The distal scaphoid was totally eroded as was the first metacarpal  base. There was some hypertrophic capsular tissue.   PROCEDURE:  Under general anesthesia with a tourniquet on the right arm. The  right hand was prepped and draped in usual fashion after and after  exsanguinating the limb, the tourniquet was inflated to 250 mmHg. A gently  curved incision was made over the dorsal aspect of the [facility] joint of the  right thumb.  Sharp dissection was carried through the subcutaneous tissues  and bleeding points were coagulated. Blunt dissection was carried down to  the extensor tendons and the interval between the APL and the EPB was  divided longitudinally. The capsule was opened and a retractor was used to  retract the tendons. The capsule was elevated off the base of the first  metacarpal and off the trapezium. A Freer elevator was used to examine the  distal scaphoid which was arthritic. Osteotome was then used to divide the  trapezium into quarters and then the trapezium bone was removed with a  rongeur. Care was taken to protect the FCR tendon. After completely excising  the trapezium their drill hole was made through the base of the first  metacarpal. It went from the  dorsal ulnar aspect to the volar radial aspect.  A FCR tendon graft was then obtained through small transverse incisions  along the volar aspect of the tendon. Half of the tendon was used and it was  stripped from proximal to distal. At the distal end there was difficulty  getting the FCR from the dorsal to the volar aspect. The tendon became  somewhat shredded. At this point it was decided that the suspension plasty  would better be performed with a slip of the APL tendon. The FCR tendon  graft was excised in the volar wound and was rolled into an anchovy and held  in place with a 4-0 Mersilene suture. Half of the APL tendon was then  divided and left based distally. The tendon was then wrapped around the FCR  in the trapezial defect area and then brought back through the hole in the  first metacarpal and was sutured back onto itself as a suspension plasty.  This was done with a 4-0 Mersilene suture. There was good suspension of the  first metacarpal. The anchovy was then placed in the defect where the  trapezium had been and was held in place with a 4-0 Mersilene suture. The  wound was irrigated copiously with saline. The vessel loop drain was left in  for drainage and the skin was closed with a 3-0 subcuticular Prolene. Steri-  Strips were applied. Half percent Marcaine was inserted and the wound edges  for pain control. The volar  wounds were then closed with 4-0 nylon sutures. Sterile dressings were  applied followed by a thumb spica splint. The tourniquet was released with  good circulation to the hand. The patient went to recovery room awake and  stable in good condition.      Lowell Bouton, M.D.  Electronically Signed     EMM/MEDQ  D:  09/30/2005  T:  10/01/2005  Job:  [id number]   cc:   Tinnie Gens A. Tawanna Cooler, M.D. [facility]  [address] [place]  [place] [address]

## 2011-03-11 ENCOUNTER — Encounter (INDEPENDENT_AMBULATORY_CARE_PROVIDER_SITE_OTHER): Payer: Medicare Other | Admitting: Ophthalmology

## 2011-03-11 DIAGNOSIS — H43819 Vitreous degeneration, unspecified eye: Secondary | ICD-10-CM

## 2011-03-11 DIAGNOSIS — H3530 Unspecified macular degeneration: Secondary | ICD-10-CM

## 2011-03-11 DIAGNOSIS — H2510 Age-related nuclear cataract, unspecified eye: Secondary | ICD-10-CM

## 2011-03-11 DIAGNOSIS — H353290 Exudative age-related macular degeneration, unspecified eye, stage unspecified: Secondary | ICD-10-CM

## 2011-03-16 ENCOUNTER — Other Ambulatory Visit: Payer: Self-pay | Admitting: Family Medicine

## 2011-03-16 DIAGNOSIS — Z1231 Encounter for screening mammogram for malignant neoplasm of breast: Secondary | ICD-10-CM

## 2011-03-27 ENCOUNTER — Ambulatory Visit
Admission: RE | Admit: 2011-03-27 | Discharge: 2011-03-27 | Disposition: A | Discharge status: Home / Self Care | Payer: Medicare Other | Source: Ambulatory Visit | Attending: Family Medicine | Admitting: Family Medicine

## 2011-03-27 DIAGNOSIS — Z1231 Encounter for screening mammogram for malignant neoplasm of breast: Secondary | ICD-10-CM

## 2011-07-01 ENCOUNTER — Encounter (INDEPENDENT_AMBULATORY_CARE_PROVIDER_SITE_OTHER): Payer: Medicare Other | Admitting: Ophthalmology

## 2011-07-01 DIAGNOSIS — H43819 Vitreous degeneration, unspecified eye: Secondary | ICD-10-CM

## 2011-07-01 DIAGNOSIS — H2510 Age-related nuclear cataract, unspecified eye: Secondary | ICD-10-CM

## 2011-07-01 DIAGNOSIS — H3530 Unspecified macular degeneration: Secondary | ICD-10-CM

## 2011-07-01 DIAGNOSIS — H353290 Exudative age-related macular degeneration, unspecified eye, stage unspecified: Secondary | ICD-10-CM

## 2011-09-29 DIAGNOSIS — J189 Pneumonia, unspecified organism: Secondary | ICD-10-CM | POA: Diagnosis not present

## 2011-09-29 DIAGNOSIS — R509 Fever, unspecified: Secondary | ICD-10-CM | POA: Diagnosis not present

## 2011-10-14 ENCOUNTER — Encounter (INDEPENDENT_AMBULATORY_CARE_PROVIDER_SITE_OTHER): Payer: Medicare Other | Admitting: Ophthalmology

## 2011-10-14 DIAGNOSIS — H353290 Exudative age-related macular degeneration, unspecified eye, stage unspecified: Secondary | ICD-10-CM

## 2011-10-14 DIAGNOSIS — H3530 Unspecified macular degeneration: Secondary | ICD-10-CM

## 2011-10-14 DIAGNOSIS — H43819 Vitreous degeneration, unspecified eye: Secondary | ICD-10-CM | POA: Diagnosis not present

## 2011-10-14 DIAGNOSIS — H2510 Age-related nuclear cataract, unspecified eye: Secondary | ICD-10-CM | POA: Diagnosis not present

## 2011-10-14 DIAGNOSIS — Z961 Presence of intraocular lens: Secondary | ICD-10-CM | POA: Diagnosis not present

## 2011-10-28 ENCOUNTER — Ambulatory Visit (INDEPENDENT_AMBULATORY_CARE_PROVIDER_SITE_OTHER): Payer: Medicare Other | Admitting: Family Medicine

## 2011-10-28 ENCOUNTER — Encounter: Payer: Self-pay | Admitting: Family Medicine

## 2011-10-28 VITALS — BP 124/84 | Temp 98.1°F | Ht 64.0 in | Wt 173.0 lb

## 2011-10-28 DIAGNOSIS — H3530 Unspecified macular degeneration: Secondary | ICD-10-CM

## 2011-10-28 DIAGNOSIS — I10 Essential (primary) hypertension: Secondary | ICD-10-CM | POA: Diagnosis not present

## 2011-10-28 DIAGNOSIS — M1990 Unspecified osteoarthritis, unspecified site: Secondary | ICD-10-CM | POA: Diagnosis not present

## 2011-10-28 DIAGNOSIS — Z0000 Encounter for general adult medical examination without abnormal findings: Secondary | ICD-10-CM

## 2011-10-28 DIAGNOSIS — IMO0002 Reserved for concepts with insufficient information to code with codable children: Secondary | ICD-10-CM

## 2011-10-28 LAB — HEPATIC FUNCTION PANEL
AST: 25 U/L (ref 0–37)
Albumin: 4.6 g/dL (ref 3.5–5.2)

## 2011-10-28 LAB — POCT URINALYSIS DIPSTICK
Bilirubin, UA: NEGATIVE
Glucose, UA: NEGATIVE
Ketones, UA: NEGATIVE
Leukocytes, UA: NEGATIVE

## 2011-10-28 LAB — CBC WITH DIFFERENTIAL/PLATELET
Eosinophils Relative: 3.1 % (ref 0.0–5.0)
HCT: 42.6 % (ref 36.0–46.0)
Lymphocytes Relative: 18.2 % (ref 12.0–46.0)
Lymphs Abs: 0.9 10*3/uL (ref 0.7–4.0)
Monocytes Relative: 7.1 % (ref 3.0–12.0)
Neutrophils Relative %: 71 % (ref 43.0–77.0)
Platelets: 224 10*3/uL (ref 150.0–400.0)
WBC: 5.2 10*3/uL (ref 4.5–10.5)

## 2011-10-28 LAB — BASIC METABOLIC PANEL
CO2: 27 mEq/L (ref 19–32)
Chloride: 103 mEq/L (ref 96–112)
Sodium: 141 mEq/L (ref 135–145)

## 2011-10-28 MED ORDER — LISINOPRIL 5 MG PO TABS: 5.0000 mg | ORAL_TABLET | Freq: Every day | ORAL | Status: DC

## 2011-10-28 MED ORDER — SPIRONOLACTONE 25 MG PO TABS: 25.0000 mg | ORAL_TABLET | Freq: Every day | ORAL | Status: DC

## 2011-10-28 NOTE — Patient Instructions (Signed)
Continue your current medications  Followup in 1 year sooner if any problems 

## 2011-10-28 NOTE — Progress Notes (Signed)
  Subjective:    Patient ID: Michelle [name], female    DOB: [birth year], 71 y.o.   MRN: [id number]  HPI Michelle [name] is a 71 year old married female nonsmoker who comes in today for a Medicare wellness examination because of a history of allergic rhinitis, hypertension,  Her blood pressure on lisinopril 5 mg daily and Aldactone 25 mg daily 124/84  She takes Zyrtec for allergic rhinitis  She sees Dr. Ashley Royalty because of retinal hemorrhage and is on injections of Avastin every 3 months.  She gets routine eye care, hearing normal, regular dental care, BSE monthly, and you mammography, colonoscopy normal, tetanus 2004, Pneumovax x2, information given on shingles  She states a month ago she went to an urgent care center with a fever and was diagnosed to have pneumonia and put on Levaquin for 10 days  Cognitive function normal she walks on a regular basis, home health safety reviewed no issues identified, no guns in the house, she does have a health care power of attorney and living will  GYN history benign last Pap 2 years ago normal recommended every 3 years since she is asymptomatic   Review of Systems  Constitutional: Negative.   HENT: Negative.   Eyes: Negative.   Respiratory: Negative.   Cardiovascular: Negative.   Gastrointestinal: Negative.   Genitourinary: Negative.   Musculoskeletal: Negative.   Neurological: Negative.   Hematological: Negative.   Psychiatric/Behavioral: Negative.        Objective:   Physical Exam  Constitutional: She appears well-developed and well-nourished.  HENT:  Head: Normocephalic and atraumatic.  Right Ear: External ear normal.  Left Ear: External ear normal.  Nose: Nose normal.  Mouth/Throat: Oropharynx is clear and moist.  Eyes: EOM are normal. Pupils are equal, round, and reactive to light.  Neck: Normal range of motion. Neck supple. No thyromegaly present.  Cardiovascular: Normal rate, regular rhythm, normal heart sounds and intact distal  pulses.  Exam reveals no gallop and no friction rub.   No murmur heard. Pulmonary/Chest: Effort normal and breath sounds normal.  Abdominal: Soft. Bowel sounds are normal. She exhibits no distension and no mass. There is no tenderness. There is no rebound.  Genitourinary:       Bilateral breast exam normal  Musculoskeletal: Normal range of motion.  Lymphadenopathy:    She has no cervical adenopathy.  Neurological: She is alert. She has normal reflexes. No cranial nerve deficit. She exhibits normal muscle tone. Coordination normal.  Skin: Skin is warm and dry.  Psychiatric: She has a normal mood and affect. Her behavior is normal. Judgment and thought content normal.          Assessment & Plan:  Healthy female  Hypertension continue current meds  Allergic rhinitis continue Zyrtec

## 2011-11-02 ENCOUNTER — Telehealth: Payer: Self-pay | Admitting: *Deleted

## 2011-11-02 NOTE — Telephone Encounter (Signed)
Spoke with patient.

## 2011-11-02 NOTE — Telephone Encounter (Signed)
(  triage voicemail)  Returning call to Fleet Contras in regards to test information.

## 2011-11-20 DIAGNOSIS — H3530 Unspecified macular degeneration: Secondary | ICD-10-CM | POA: Diagnosis not present

## 2011-11-20 DIAGNOSIS — H2510 Age-related nuclear cataract, unspecified eye: Secondary | ICD-10-CM | POA: Diagnosis not present

## 2011-11-20 DIAGNOSIS — Z961 Presence of intraocular lens: Secondary | ICD-10-CM | POA: Diagnosis not present

## 2011-11-20 DIAGNOSIS — H40059 Ocular hypertension, unspecified eye: Secondary | ICD-10-CM | POA: Diagnosis not present

## 2011-11-27 DIAGNOSIS — Z961 Presence of intraocular lens: Secondary | ICD-10-CM | POA: Diagnosis not present

## 2011-11-27 DIAGNOSIS — H40059 Ocular hypertension, unspecified eye: Secondary | ICD-10-CM | POA: Diagnosis not present

## 2011-11-27 DIAGNOSIS — H2510 Age-related nuclear cataract, unspecified eye: Secondary | ICD-10-CM | POA: Diagnosis not present

## 2011-11-30 ENCOUNTER — Encounter: Payer: Medicare Other | Admitting: Family Medicine

## 2011-12-18 DIAGNOSIS — Z961 Presence of intraocular lens: Secondary | ICD-10-CM | POA: Diagnosis not present

## 2011-12-18 DIAGNOSIS — H0230 Blepharochalasis unspecified eye, unspecified eyelid: Secondary | ICD-10-CM | POA: Diagnosis not present

## 2011-12-18 DIAGNOSIS — H40059 Ocular hypertension, unspecified eye: Secondary | ICD-10-CM | POA: Diagnosis not present

## 2011-12-18 DIAGNOSIS — H3530 Unspecified macular degeneration: Secondary | ICD-10-CM | POA: Diagnosis not present

## 2012-02-10 ENCOUNTER — Encounter (INDEPENDENT_AMBULATORY_CARE_PROVIDER_SITE_OTHER): Payer: Medicare Other | Admitting: Ophthalmology

## 2012-02-10 DIAGNOSIS — H353290 Exudative age-related macular degeneration, unspecified eye, stage unspecified: Secondary | ICD-10-CM

## 2012-02-10 DIAGNOSIS — H3530 Unspecified macular degeneration: Secondary | ICD-10-CM

## 2012-02-10 DIAGNOSIS — I10 Essential (primary) hypertension: Secondary | ICD-10-CM

## 2012-02-10 DIAGNOSIS — H43819 Vitreous degeneration, unspecified eye: Secondary | ICD-10-CM | POA: Diagnosis not present

## 2012-02-10 DIAGNOSIS — H35039 Hypertensive retinopathy, unspecified eye: Secondary | ICD-10-CM

## 2012-02-10 DIAGNOSIS — H2510 Age-related nuclear cataract, unspecified eye: Secondary | ICD-10-CM

## 2012-03-29 DIAGNOSIS — H40059 Ocular hypertension, unspecified eye: Secondary | ICD-10-CM | POA: Diagnosis not present

## 2012-03-29 DIAGNOSIS — H2510 Age-related nuclear cataract, unspecified eye: Secondary | ICD-10-CM | POA: Diagnosis not present

## 2012-03-29 DIAGNOSIS — Z961 Presence of intraocular lens: Secondary | ICD-10-CM | POA: Diagnosis not present

## 2012-03-29 DIAGNOSIS — H3530 Unspecified macular degeneration: Secondary | ICD-10-CM | POA: Diagnosis not present

## 2012-05-04 ENCOUNTER — Other Ambulatory Visit: Payer: Self-pay | Admitting: Family Medicine

## 2012-05-04 DIAGNOSIS — Z1231 Encounter for screening mammogram for malignant neoplasm of breast: Secondary | ICD-10-CM

## 2012-05-11 DIAGNOSIS — Z23 Encounter for immunization: Secondary | ICD-10-CM | POA: Diagnosis not present

## 2012-05-30 ENCOUNTER — Ambulatory Visit
Admission: RE | Admit: 2012-05-30 | Discharge: 2012-05-30 | Disposition: A | Discharge status: Home / Self Care | Payer: Medicare Other | Source: Ambulatory Visit | Attending: Family Medicine | Admitting: Family Medicine

## 2012-05-30 DIAGNOSIS — Z1231 Encounter for screening mammogram for malignant neoplasm of breast: Secondary | ICD-10-CM

## 2012-06-01 ENCOUNTER — Encounter (INDEPENDENT_AMBULATORY_CARE_PROVIDER_SITE_OTHER): Payer: Medicare Other | Admitting: Ophthalmology

## 2012-06-01 DIAGNOSIS — I10 Essential (primary) hypertension: Secondary | ICD-10-CM

## 2012-06-01 DIAGNOSIS — H3530 Unspecified macular degeneration: Secondary | ICD-10-CM

## 2012-06-01 DIAGNOSIS — H353290 Exudative age-related macular degeneration, unspecified eye, stage unspecified: Secondary | ICD-10-CM

## 2012-06-01 DIAGNOSIS — H43819 Vitreous degeneration, unspecified eye: Secondary | ICD-10-CM

## 2012-06-01 DIAGNOSIS — H35039 Hypertensive retinopathy, unspecified eye: Secondary | ICD-10-CM

## 2012-06-01 DIAGNOSIS — H2510 Age-related nuclear cataract, unspecified eye: Secondary | ICD-10-CM

## 2012-10-19 DIAGNOSIS — Z961 Presence of intraocular lens: Secondary | ICD-10-CM | POA: Diagnosis not present

## 2012-10-19 DIAGNOSIS — H2510 Age-related nuclear cataract, unspecified eye: Secondary | ICD-10-CM | POA: Diagnosis not present

## 2012-10-19 DIAGNOSIS — H40059 Ocular hypertension, unspecified eye: Secondary | ICD-10-CM | POA: Diagnosis not present

## 2012-10-19 DIAGNOSIS — H3530 Unspecified macular degeneration: Secondary | ICD-10-CM | POA: Diagnosis not present

## 2012-10-31 ENCOUNTER — Other Ambulatory Visit (HOSPITAL_COMMUNITY)
Admission: RE | Admit: 2012-10-31 | Discharge: 2012-10-31 | Disposition: A | Discharge status: Home / Self Care | Payer: Medicare Other | Source: Ambulatory Visit | Attending: Family Medicine | Admitting: Family Medicine

## 2012-10-31 ENCOUNTER — Ambulatory Visit (INDEPENDENT_AMBULATORY_CARE_PROVIDER_SITE_OTHER): Payer: Medicare Other | Admitting: Family Medicine

## 2012-10-31 ENCOUNTER — Encounter: Payer: Self-pay | Admitting: Family Medicine

## 2012-10-31 VITALS — BP 130/80 | Temp 98.1°F | Ht 64.5 in | Wt 178.0 lb

## 2012-10-31 DIAGNOSIS — IMO0002 Reserved for concepts with insufficient information to code with codable children: Secondary | ICD-10-CM | POA: Diagnosis not present

## 2012-10-31 DIAGNOSIS — Z0000 Encounter for general adult medical examination without abnormal findings: Secondary | ICD-10-CM

## 2012-10-31 DIAGNOSIS — J309 Allergic rhinitis, unspecified: Secondary | ICD-10-CM | POA: Diagnosis not present

## 2012-10-31 DIAGNOSIS — Z124 Encounter for screening for malignant neoplasm of cervix: Secondary | ICD-10-CM | POA: Insufficient documentation

## 2012-10-31 DIAGNOSIS — L259 Unspecified contact dermatitis, unspecified cause: Secondary | ICD-10-CM | POA: Insufficient documentation

## 2012-10-31 DIAGNOSIS — M1990 Unspecified osteoarthritis, unspecified site: Secondary | ICD-10-CM

## 2012-10-31 DIAGNOSIS — Z01419 Encounter for gynecological examination (general) (routine) without abnormal findings: Secondary | ICD-10-CM

## 2012-10-31 LAB — CBC WITH DIFFERENTIAL/PLATELET
Basophils Relative: 0.8 % (ref 0.0–3.0)
Eosinophils Relative: 5.7 % — ABNORMAL HIGH (ref 0.0–5.0)
HCT: 41.4 % (ref 36.0–46.0)
Lymphs Abs: 1.1 10*3/uL (ref 0.7–4.0)
MCV: 90.1 fl (ref 78.0–100.0)
Monocytes Absolute: 0.3 10*3/uL (ref 0.1–1.0)
Neutro Abs: 2.9 10*3/uL (ref 1.4–7.7)
RBC: 4.6 Mil/uL (ref 3.87–5.11)
WBC: 4.6 10*3/uL (ref 4.5–10.5)

## 2012-10-31 LAB — HEPATIC FUNCTION PANEL
ALT: 16 U/L (ref 0–35)
AST: 22 U/L (ref 0–37)
Albumin: 4.3 g/dL (ref 3.5–5.2)
Total Bilirubin: 0.9 mg/dL (ref 0.3–1.2)
Total Protein: 7 g/dL (ref 6.0–8.3)

## 2012-10-31 LAB — POCT URINALYSIS DIPSTICK
Ketones, UA: NEGATIVE
Protein, UA: NEGATIVE
Spec Grav, UA: 1.015
pH, UA: 7

## 2012-10-31 LAB — BASIC METABOLIC PANEL
BUN: 14 mg/dL (ref 6–23)
Chloride: 107 mEq/L (ref 96–112)
Creatinine, Ser: 1 mg/dL (ref 0.4–1.2)
GFR: 61.14 mL/min (ref >60.00)
Potassium: 4.3 mEq/L (ref 3.5–5.1)
Sodium: 141 mEq/L (ref 135–145)

## 2012-10-31 MED ORDER — SPIRONOLACTONE 25 MG PO TABS: 25.0000 mg | ORAL_TABLET | Freq: Every day | ORAL | Status: DC

## 2012-10-31 MED ORDER — LISINOPRIL 5 MG PO TABS: 5.0000 mg | ORAL_TABLET | Freq: Every day | ORAL | Status: DC

## 2012-10-31 MED ORDER — FLUOCINONIDE 0.05 % EX CREA: 1.0000 "application " | TOPICAL_CREAM | Freq: Every day | CUTANEOUS | Status: DC

## 2012-10-31 NOTE — Patient Instructions (Signed)
Continue your current medications  Return ASAP for removal of the lesion on your right lower leg  Continue to do BSE monthly  Return in one year for general Medicare wellness examination sooner if any problems

## 2012-10-31 NOTE — Progress Notes (Signed)
  Subjective:    Patient ID: Michelle [name], female    DOB: 09/23/1940, 72 y.o.   MRN: [id number]  HPI Michelle [name] is a delightful 69 year oldmarried female nonsmoker who comes in today for a Medicare wellness examination because of a history of underlying allergic rhinitis, contact dermatitis, hypertension, macular degeneration  She's currently seeing her ophthalmologist on a regular basis and she gets injections of Avastin for wet macular degeneration  She takes Zyrtec 10 mg for allergic rhinitis and uses Lidex cream when necessary for eczema  She takes lisinopril 5 mg daily along with Aldactone 25 mg daily for hypertension. BP 130/80. Many years ago she had a right renal angioplasty for renal artery stenosis. She's done well since that time and her blood pressures been well controlled with the above medications  She gets routine eye care, dental care, BSE monthly, and you mammography, colonoscopy 9 years ago normal, she's due a tetanus booster in the shingles vaccine  She'll flu shot 2013 Pneumovax x2  Cognitive function normal she walks on a regular basis home health safety reviewed no issues identified, no guns in the house, she does have a health care power of attorney and a living well   Review of Systems  Constitutional: Negative.   HENT: Negative.   Eyes: Negative.   Respiratory: Negative.   Cardiovascular: Negative.   Gastrointestinal: Negative.   Genitourinary: Negative.   Musculoskeletal: Negative.   Neurological: Negative.   Psychiatric/Behavioral: Negative.        Objective:   Physical Exam  Constitutional: She appears well-developed and well-nourished.  HENT:  Head: Normocephalic and atraumatic.  Right Ear: External ear normal.  Left Ear: External ear normal.  Nose: Nose normal.  Mouth/Throat: Oropharynx is clear and moist.  Eyes: EOM are normal. Pupils are equal, round, and reactive to light.  Neck: Normal range of motion. Neck supple. No thyromegaly present.   Cardiovascular: Normal rate, regular rhythm, normal heart sounds and intact distal pulses.  Exam reveals no gallop and no friction rub.   No murmur heard. Pulmonary/Chest: Effort normal and breath sounds normal.  Abdominal: Soft. Bowel sounds are normal. She exhibits no distension and no mass. There is no tenderness. There is no rebound.  Genitourinary: Vagina normal and uterus normal. Guaiac negative stool. No vaginal discharge found.  Bilateral breast exam normal  Musculoskeletal: Normal range of motion.  Lymphadenopathy:    She has no cervical adenopathy.  Neurological: She is alert. She has normal reflexes. No cranial nerve deficit. She exhibits normal muscle tone. Coordination normal.  Skin: Skin is warm and dry.  Total body skin exam normal she has a garden variety of freckles moles capillaries hemangiomas seborrheic keratosis. She does have a nonhealing lesion right lower calf x6 months advised to return for removal ASAP  Psychiatric: She has a normal mood and affect. Her behavior is normal. Judgment and thought content normal.          Assessment & Plan:  Healthy female  History of wet macular degeneration....... Continue followup by ophthalmology  Allergic rhinitis Zyrtec 10 mg each bedtime  Contact dermatitis continue Lidex when necessary  Hypertension continue lisinopril 5 mg daily along with Aldactone 25 mg daily  Abnormal lesion right lower leg return for removal ASAP

## 2012-11-02 ENCOUNTER — Encounter (INDEPENDENT_AMBULATORY_CARE_PROVIDER_SITE_OTHER): Payer: Medicare Other | Admitting: Ophthalmology

## 2012-11-02 DIAGNOSIS — H353290 Exudative age-related macular degeneration, unspecified eye, stage unspecified: Secondary | ICD-10-CM

## 2012-11-02 DIAGNOSIS — H3530 Unspecified macular degeneration: Secondary | ICD-10-CM | POA: Diagnosis not present

## 2012-11-02 DIAGNOSIS — H35039 Hypertensive retinopathy, unspecified eye: Secondary | ICD-10-CM

## 2012-11-02 DIAGNOSIS — H43819 Vitreous degeneration, unspecified eye: Secondary | ICD-10-CM

## 2012-11-02 DIAGNOSIS — I10 Essential (primary) hypertension: Secondary | ICD-10-CM

## 2012-11-02 DIAGNOSIS — H2510 Age-related nuclear cataract, unspecified eye: Secondary | ICD-10-CM

## 2013-01-23 ENCOUNTER — Ambulatory Visit (INDEPENDENT_AMBULATORY_CARE_PROVIDER_SITE_OTHER): Payer: Medicare Other | Admitting: Family Medicine

## 2013-01-23 ENCOUNTER — Encounter: Payer: Self-pay | Admitting: Family Medicine

## 2013-01-23 DIAGNOSIS — D235 Other benign neoplasm of skin of trunk: Secondary | ICD-10-CM | POA: Diagnosis not present

## 2013-01-23 DIAGNOSIS — Z23 Encounter for immunization: Secondary | ICD-10-CM

## 2013-01-23 DIAGNOSIS — D0470 Carcinoma in situ of skin of unspecified lower limb, including hip: Secondary | ICD-10-CM | POA: Diagnosis not present

## 2013-01-23 NOTE — Addendum Note (Signed)
Addended by: Kern Reap B on: 01/23/2013 05:16 PM   Modules accepted: Orders

## 2013-01-23 NOTE — Patient Instructions (Addendum)
Remove the Band-Aid tomorrow  Within 2 weeks we will call you the report,,,,,,,, if we do not call you within 2 weeks call me

## 2013-01-23 NOTE — Progress Notes (Signed)
  Subjective:    Patient ID: Michelle [name], female    DOB: 08/24/1940, 72 y.o.   MRN: [id number]  HPI Michelle [name] is a 72 year old female who comes in today for removal of the lesion on her right lower extremity  The lesion measures 12 mm x 12 mm his face and red.  She does have light skin and light eyes. No previous history of skin cancer   Review of Systems Review of systems negative    Objective:   Physical Exam Procedure was as follows  After informed consent the lesion was anesthetized with 1% Xylocaine with epinephrine. It was excised with 2 mm margins and sent for pathologic analysis. The bases cauterized Band-Aids applied she tolerated the procedure no complications.       Assessment & Plan:  Lesion clinically appears to be a dysplastic nevus path pending

## 2013-02-08 ENCOUNTER — Encounter: Payer: Self-pay | Admitting: Family Medicine

## 2013-02-08 ENCOUNTER — Ambulatory Visit (INDEPENDENT_AMBULATORY_CARE_PROVIDER_SITE_OTHER): Payer: Medicare Other | Admitting: Family Medicine

## 2013-02-08 DIAGNOSIS — D049 Carcinoma in situ of skin, unspecified: Secondary | ICD-10-CM

## 2013-02-08 DIAGNOSIS — D0470 Carcinoma in situ of skin of unspecified lower limb, including hip: Secondary | ICD-10-CM | POA: Diagnosis not present

## 2013-02-08 DIAGNOSIS — C44721 Squamous cell carcinoma of skin of unspecified lower limb, including hip: Secondary | ICD-10-CM

## 2013-02-08 DIAGNOSIS — C44729 Squamous cell carcinoma of skin of left lower limb, including hip: Secondary | ICD-10-CM

## 2013-02-08 NOTE — Progress Notes (Signed)
  Subjective:    Patient ID: Hassan Rowan, female    DOB: [birth year], 72 y.o.   MRN: [id number]  HPIPat is a 72 year old married female nonsmoker who comes in today for wide excision of a squamous cell carcinoma in situ with question margins involvement  We removed a lesion from her left lower extremity a couple weeks ago. It came back carcinoma in situ with question margin involvement. She's here today for wide excision  After informed consent the lesion was anesthetized with 1% Xylocaine with epinephrine after an alcohol prep. A wide excision was done with 4 mm margins. The base was cauterized Band-Aids was applied the lesion was sent for pathologic analysis  The lesion measures 10 mm x 10 mm    Review of Systems    review of systems otherwise negative Objective:   Physical Exam  Procedure see above      Assessment & Plan:  Squamous cell carcinoma with wide excision see above

## 2013-02-08 NOTE — Patient Instructions (Signed)
We will call you within 2 weeks with the report  Wound instructions given

## 2013-03-01 ENCOUNTER — Telehealth: Payer: Self-pay | Admitting: Family Medicine

## 2013-03-01 NOTE — Telephone Encounter (Signed)
Pt would like her pathology reports.

## 2013-03-02 NOTE — Telephone Encounter (Signed)
Patient is aware of path report

## 2013-04-18 DIAGNOSIS — H2510 Age-related nuclear cataract, unspecified eye: Secondary | ICD-10-CM | POA: Diagnosis not present

## 2013-04-18 DIAGNOSIS — H02839 Dermatochalasis of unspecified eye, unspecified eyelid: Secondary | ICD-10-CM | POA: Diagnosis not present

## 2013-04-18 DIAGNOSIS — H40059 Ocular hypertension, unspecified eye: Secondary | ICD-10-CM | POA: Diagnosis not present

## 2013-04-18 DIAGNOSIS — Z961 Presence of intraocular lens: Secondary | ICD-10-CM | POA: Diagnosis not present

## 2013-04-26 ENCOUNTER — Encounter (INDEPENDENT_AMBULATORY_CARE_PROVIDER_SITE_OTHER): Payer: Medicare Other | Admitting: Ophthalmology

## 2013-04-26 DIAGNOSIS — H2510 Age-related nuclear cataract, unspecified eye: Secondary | ICD-10-CM

## 2013-04-26 DIAGNOSIS — H3530 Unspecified macular degeneration: Secondary | ICD-10-CM | POA: Diagnosis not present

## 2013-04-26 DIAGNOSIS — H35039 Hypertensive retinopathy, unspecified eye: Secondary | ICD-10-CM | POA: Diagnosis not present

## 2013-04-26 DIAGNOSIS — H43819 Vitreous degeneration, unspecified eye: Secondary | ICD-10-CM

## 2013-04-26 DIAGNOSIS — I10 Essential (primary) hypertension: Secondary | ICD-10-CM | POA: Diagnosis not present

## 2013-05-08 DIAGNOSIS — Z23 Encounter for immunization: Secondary | ICD-10-CM | POA: Diagnosis not present

## 2013-05-18 DIAGNOSIS — H353290 Exudative age-related macular degeneration, unspecified eye, stage unspecified: Secondary | ICD-10-CM | POA: Diagnosis not present

## 2013-05-18 DIAGNOSIS — H2589 Other age-related cataract: Secondary | ICD-10-CM | POA: Diagnosis not present

## 2013-05-18 DIAGNOSIS — Z961 Presence of intraocular lens: Secondary | ICD-10-CM | POA: Diagnosis not present

## 2013-05-18 DIAGNOSIS — H40059 Ocular hypertension, unspecified eye: Secondary | ICD-10-CM | POA: Diagnosis not present

## 2013-05-18 DIAGNOSIS — H02839 Dermatochalasis of unspecified eye, unspecified eyelid: Secondary | ICD-10-CM | POA: Diagnosis not present

## 2013-05-23 ENCOUNTER — Other Ambulatory Visit: Payer: Self-pay

## 2013-05-23 DIAGNOSIS — Z1231 Encounter for screening mammogram for malignant neoplasm of breast: Secondary | ICD-10-CM

## 2013-05-31 DIAGNOSIS — H2589 Other age-related cataract: Secondary | ICD-10-CM | POA: Diagnosis not present

## 2013-06-05 DIAGNOSIS — H25049 Posterior subcapsular polar age-related cataract, unspecified eye: Secondary | ICD-10-CM | POA: Diagnosis not present

## 2013-06-05 DIAGNOSIS — H2510 Age-related nuclear cataract, unspecified eye: Secondary | ICD-10-CM | POA: Diagnosis not present

## 2013-06-05 DIAGNOSIS — H25019 Cortical age-related cataract, unspecified eye: Secondary | ICD-10-CM | POA: Diagnosis not present

## 2013-06-05 DIAGNOSIS — H2589 Other age-related cataract: Secondary | ICD-10-CM | POA: Diagnosis not present

## 2013-06-06 DIAGNOSIS — Z961 Presence of intraocular lens: Secondary | ICD-10-CM | POA: Diagnosis not present

## 2013-06-20 ENCOUNTER — Ambulatory Visit
Admission: RE | Admit: 2013-06-20 | Discharge: 2013-06-20 | Disposition: A | Discharge status: Home / Self Care | Payer: Medicare Other | Source: Ambulatory Visit

## 2013-06-20 DIAGNOSIS — Z1231 Encounter for screening mammogram for malignant neoplasm of breast: Secondary | ICD-10-CM | POA: Diagnosis not present

## 2013-08-17 DIAGNOSIS — L821 Other seborrheic keratosis: Secondary | ICD-10-CM | POA: Diagnosis not present

## 2013-08-17 DIAGNOSIS — L723 Sebaceous cyst: Secondary | ICD-10-CM | POA: Diagnosis not present

## 2013-08-17 DIAGNOSIS — Z85828 Personal history of other malignant neoplasm of skin: Secondary | ICD-10-CM | POA: Diagnosis not present

## 2013-08-17 DIAGNOSIS — L570 Actinic keratosis: Secondary | ICD-10-CM | POA: Diagnosis not present

## 2013-10-09 ENCOUNTER — Telehealth: Payer: Self-pay | Admitting: Family Medicine

## 2013-10-09 NOTE — Telephone Encounter (Signed)
Please offer a Wednesday morning

## 2013-10-09 NOTE — Telephone Encounter (Signed)
Pt needs cpx after 4/7 date, pt fasts for cpx however Dr. Sherren Mocha only has afternoon appointments.  Can we work patient in sometime in April for a morning cpx?  Please advise.

## 2013-10-10 NOTE — Telephone Encounter (Signed)
Pt is sch for 11-01-13

## 2013-10-25 ENCOUNTER — Other Ambulatory Visit: Payer: Self-pay | Admitting: Family Medicine

## 2013-10-25 ENCOUNTER — Encounter (INDEPENDENT_AMBULATORY_CARE_PROVIDER_SITE_OTHER): Payer: Medicare Other | Admitting: Ophthalmology

## 2013-10-25 DIAGNOSIS — H3530 Unspecified macular degeneration: Secondary | ICD-10-CM

## 2013-10-25 DIAGNOSIS — H35039 Hypertensive retinopathy, unspecified eye: Secondary | ICD-10-CM

## 2013-10-25 DIAGNOSIS — H43819 Vitreous degeneration, unspecified eye: Secondary | ICD-10-CM | POA: Diagnosis not present

## 2013-10-25 DIAGNOSIS — I10 Essential (primary) hypertension: Secondary | ICD-10-CM | POA: Diagnosis not present

## 2013-11-01 ENCOUNTER — Encounter: Payer: Self-pay | Admitting: Family Medicine

## 2013-11-01 ENCOUNTER — Ambulatory Visit (INDEPENDENT_AMBULATORY_CARE_PROVIDER_SITE_OTHER): Payer: Medicare Other | Admitting: Family Medicine

## 2013-11-01 ENCOUNTER — Telehealth: Payer: Self-pay | Admitting: Family Medicine

## 2013-11-01 VITALS — BP 120/80 | Temp 97.7°F | Ht 63.75 in | Wt 185.0 lb

## 2013-11-01 DIAGNOSIS — H3530 Unspecified macular degeneration: Secondary | ICD-10-CM | POA: Diagnosis not present

## 2013-11-01 DIAGNOSIS — M1990 Unspecified osteoarthritis, unspecified site: Secondary | ICD-10-CM | POA: Diagnosis not present

## 2013-11-01 DIAGNOSIS — IMO0002 Reserved for concepts with insufficient information to code with codable children: Secondary | ICD-10-CM

## 2013-11-01 DIAGNOSIS — Z0000 Encounter for general adult medical examination without abnormal findings: Secondary | ICD-10-CM

## 2013-11-01 DIAGNOSIS — Z23 Encounter for immunization: Secondary | ICD-10-CM

## 2013-11-01 LAB — HEPATIC FUNCTION PANEL
ALK PHOS: 63 U/L (ref 39–117)
ALT: 18 U/L (ref 0–35)
AST: 21 U/L (ref 0–37)
Albumin: 4.3 g/dL (ref 3.5–5.2)
BILIRUBIN DIRECT: 0 mg/dL (ref 0.0–0.3)
Total Bilirubin: 1.1 mg/dL (ref 0.3–1.2)
Total Protein: 7 g/dL (ref 6.0–8.3)

## 2013-11-01 LAB — CBC WITH DIFFERENTIAL/PLATELET
BASOS ABS: 0.1 10*3/uL (ref 0.0–0.1)
Basophils Relative: 1.1 % (ref 0.0–3.0)
EOS ABS: 0.2 10*3/uL (ref 0.0–0.7)
Eosinophils Relative: 4.1 % (ref 0.0–5.0)
HEMATOCRIT: 41.7 % (ref 36.0–46.0)
Hemoglobin: 14.4 g/dL (ref 12.0–15.0)
LYMPHS ABS: 1 10*3/uL (ref 0.7–4.0)
Lymphocytes Relative: 19.9 % (ref 12.0–46.0)
MCHC: 34.5 g/dL (ref 30.0–36.0)
MCV: 89.3 fl (ref 78.0–100.0)
MONO ABS: 0.3 10*3/uL (ref 0.1–1.0)
Monocytes Relative: 6.4 % (ref 3.0–12.0)
NEUTROS ABS: 3.3 10*3/uL (ref 1.4–7.7)
Neutrophils Relative %: 68.5 % (ref 43.0–77.0)
PLATELETS: 265 10*3/uL (ref 150.0–400.0)
RBC: 4.67 Mil/uL (ref 3.87–5.11)
RDW: 12.6 % (ref 11.5–14.6)
WBC: 4.8 10*3/uL (ref 4.5–10.5)

## 2013-11-01 LAB — BASIC METABOLIC PANEL
BUN: 19 mg/dL (ref 6–23)
CALCIUM: 10.5 mg/dL (ref 8.4–10.5)
CO2: 27 mEq/L (ref 19–32)
Chloride: 104 mEq/L (ref 96–112)
Creatinine, Ser: 1.2 mg/dL (ref 0.4–1.2)
GFR: 44.84 mL/min — ABNORMAL LOW (ref >60.00)
Glucose, Bld: 109 mg/dL — ABNORMAL HIGH (ref 70–99)
POTASSIUM: 3.6 meq/L (ref 3.5–5.1)
Sodium: 140 mEq/L (ref 135–145)

## 2013-11-01 LAB — POCT URINALYSIS DIPSTICK
Bilirubin, UA: NEGATIVE
Glucose, UA: NEGATIVE
Ketones, UA: NEGATIVE
NITRITE UA: NEGATIVE
PROTEIN UA: NEGATIVE
RBC UA: NEGATIVE
Spec Grav, UA: 1.015
UROBILINOGEN UA: 0.2
pH, UA: 8

## 2013-11-01 LAB — LIPID PANEL
CHOLESTEROL: 190 mg/dL (ref 0–200)
HDL: 39.2 mg/dL (ref >39.00)
LDL CALC: 105 mg/dL — AB (ref 0–99)
Total CHOL/HDL Ratio: 5
Triglycerides: 230 mg/dL — ABNORMAL HIGH (ref 0.0–149.0)
VLDL: 46 mg/dL — ABNORMAL HIGH (ref 0.0–40.0)

## 2013-11-01 LAB — TSH: TSH: 1.67 u[IU]/mL (ref 0.35–5.50)

## 2013-11-01 MED ORDER — LISINOPRIL 5 MG PO TABS: ORAL_TABLET | ORAL | Status: DC

## 2013-11-01 NOTE — Progress Notes (Signed)
   Subjective:    Patient ID: Michelle [name], female    DOB: 09-29-1940, 73 y.o.   MRN: [id number]  HPI Michelle [name] is a 73 year old married female nonsmoker who comes in today for a Medicare wellness examination because of history of hypertension  She is a history of hypertension and has been controlled with lisinopril 5 mg daily. BP today 120/80. She also takes Aldactone 25 mg daily.  She is allergic rhinitis for which he takes over-the-counter Zantac  She's had treatment for macular degeneration in her right eye for 5 years with Avastin. Currently off therapy this a revision is stable  She gets routine eye care, dental care, BSE monthly, annual mammography, 6 colonoscopy and GI, vaccinations updated today  Cognitive function normal she walks on a daily basis home health safety reviewed no issues identified, no  Guns in the house, she does have a health care power of attorney and living well   Review of Systems  Constitutional: Negative.   HENT: Negative.   Eyes: Negative.   Respiratory: Negative.   Cardiovascular: Negative.   Gastrointestinal: Negative.   Genitourinary: Negative.   Musculoskeletal: Negative.   Neurological: Negative.   Psychiatric/Behavioral: Negative.        Objective:   Physical Exam  Nursing note and vitals reviewed. Constitutional: She appears well-developed and well-nourished.  HENT:  Head: Normocephalic and atraumatic.  Right Ear: External ear normal.  Left Ear: External ear normal.  Nose: Nose normal.  Mouth/Throat: Oropharynx is clear and moist.  Eyes: EOM are normal. Pupils are equal, round, and reactive to light.  Neck: Normal range of motion. Neck supple. No thyromegaly present.  Cardiovascular: Normal rate, regular rhythm, normal heart sounds and intact distal pulses.  Exam reveals no gallop and no friction rub.   No murmur heard. Pulmonary/Chest: Effort normal and breath sounds normal.  Abdominal: Soft. Bowel sounds are normal. She exhibits no  distension and no mass. There is no tenderness. There is no rebound.  Genitourinary:  Bilateral breast exam normal.  Pelvic last year and Pap normal therefore be 3 years since she is asymptomatic  Musculoskeletal: Normal range of motion.  Lymphadenopathy:    She has no cervical adenopathy.  Neurological: She is alert. She has normal reflexes. No cranial nerve deficit. She exhibits normal muscle tone. Coordination normal.  Skin: Skin is warm and dry.  Total body skin exam normal  Psychiatric: She has a normal mood and affect. Her behavior is normal. Judgment and thought content normal.          Assessment & Plan:  Healthy female  Hypertension goal continue current therapy  History of skin cancer followed by dermatology  Allergic rhinitis OTC Zantac  Macular degeneration right eye followed by ophthalmology

## 2013-11-01 NOTE — Progress Notes (Signed)
Pre visit review using our clinic review tool, if applicable. No additional management support is needed unless otherwise documented below in the visit note. 

## 2013-11-01 NOTE — Patient Instructions (Signed)
Continue your current medications  When you schedule your next physical examination make it with Dr. Rodena Goldmann  It's been my pleasure to be your doctor-through all these years !!!!!!!!!!!!!!!!! thank you

## 2013-11-01 NOTE — Telephone Encounter (Signed)
Relevant patient education assigned to patient using Emmi. ° °

## 2013-11-16 DIAGNOSIS — H353190 Nonexudative age-related macular degeneration, unspecified eye, stage unspecified: Secondary | ICD-10-CM | POA: Diagnosis not present

## 2013-11-16 DIAGNOSIS — H02839 Dermatochalasis of unspecified eye, unspecified eyelid: Secondary | ICD-10-CM | POA: Diagnosis not present

## 2013-11-16 DIAGNOSIS — H40059 Ocular hypertension, unspecified eye: Secondary | ICD-10-CM | POA: Diagnosis not present

## 2013-11-16 DIAGNOSIS — H353290 Exudative age-related macular degeneration, unspecified eye, stage unspecified: Secondary | ICD-10-CM | POA: Diagnosis not present

## 2013-11-16 DIAGNOSIS — Z961 Presence of intraocular lens: Secondary | ICD-10-CM | POA: Diagnosis not present

## 2014-04-26 ENCOUNTER — Ambulatory Visit (INDEPENDENT_AMBULATORY_CARE_PROVIDER_SITE_OTHER): Payer: Medicare Other | Admitting: Ophthalmology

## 2014-04-26 DIAGNOSIS — I10 Essential (primary) hypertension: Secondary | ICD-10-CM | POA: Diagnosis not present

## 2014-04-26 DIAGNOSIS — H3531 Nonexudative age-related macular degeneration: Secondary | ICD-10-CM

## 2014-04-26 DIAGNOSIS — H3532 Exudative age-related macular degeneration: Secondary | ICD-10-CM

## 2014-04-26 DIAGNOSIS — H35033 Hypertensive retinopathy, bilateral: Secondary | ICD-10-CM | POA: Diagnosis not present

## 2014-04-26 DIAGNOSIS — H43813 Vitreous degeneration, bilateral: Secondary | ICD-10-CM

## 2014-05-11 ENCOUNTER — Other Ambulatory Visit: Payer: Self-pay

## 2014-05-18 DIAGNOSIS — H02831 Dermatochalasis of right upper eyelid: Secondary | ICD-10-CM | POA: Diagnosis not present

## 2014-05-18 DIAGNOSIS — Z961 Presence of intraocular lens: Secondary | ICD-10-CM | POA: Diagnosis not present

## 2014-05-18 DIAGNOSIS — H40053 Ocular hypertension, bilateral: Secondary | ICD-10-CM | POA: Diagnosis not present

## 2014-05-18 DIAGNOSIS — H02834 Dermatochalasis of left upper eyelid: Secondary | ICD-10-CM | POA: Diagnosis not present

## 2014-05-21 DIAGNOSIS — Z23 Encounter for immunization: Secondary | ICD-10-CM | POA: Diagnosis not present

## 2014-06-04 ENCOUNTER — Encounter (INDEPENDENT_AMBULATORY_CARE_PROVIDER_SITE_OTHER): Payer: Medicare Other | Admitting: Ophthalmology

## 2014-06-04 DIAGNOSIS — H3531 Nonexudative age-related macular degeneration: Secondary | ICD-10-CM

## 2014-06-04 DIAGNOSIS — H3532 Exudative age-related macular degeneration: Secondary | ICD-10-CM | POA: Diagnosis not present

## 2014-06-04 DIAGNOSIS — I10 Essential (primary) hypertension: Secondary | ICD-10-CM

## 2014-06-04 DIAGNOSIS — H43813 Vitreous degeneration, bilateral: Secondary | ICD-10-CM | POA: Diagnosis not present

## 2014-06-04 DIAGNOSIS — H35033 Hypertensive retinopathy, bilateral: Secondary | ICD-10-CM

## 2014-06-06 ENCOUNTER — Ambulatory Visit (INDEPENDENT_AMBULATORY_CARE_PROVIDER_SITE_OTHER): Payer: Medicare Other | Admitting: Family Medicine

## 2014-06-06 VITALS — BP 180/110 | Temp 98.0°F | Wt 185.0 lb

## 2014-06-06 DIAGNOSIS — I10 Essential (primary) hypertension: Secondary | ICD-10-CM | POA: Diagnosis not present

## 2014-06-06 MED ORDER — SPIRONOLACTONE 25 MG PO TABS: ORAL_TABLET | ORAL | Status: DC

## 2014-06-06 MED ORDER — LISINOPRIL 10 MG PO TABS: 10.0000 mg | ORAL_TABLET | Freq: Every day | ORAL | Status: DC

## 2014-06-06 NOTE — Patient Instructions (Signed)
Zestril 10 mg........Marland Kitchen 1 daily in the morning  Aldactone...... One twice daily  BP check daily in the morning..........  Follow-up next Thursday

## 2014-06-06 NOTE — Progress Notes (Signed)
   Subjective:    Patient ID: Michelle [name], female    DOB: November 08, 1940, 73 y.o.   MRN: [id number]  HPIPatricia is a 73 year old married female nonsmoker who comes in today for evaluation of hypertension  She's been on Zestril 5 mg daily and Aldactone 25 mg daily for many years. 35 years ago she came in with elevated hypertension was found to have a significant blockage in her right renal artery. She underwent angioplasty and has done well since that time. She's been checking her blood pressure at home for the past 3 weeks and they've all been elevated. The other day she took an extra Aldactone and her BP dropped to 145/70    Review of Systems Review of systems otherwise negative    Objective:   Physical Exam  Well-developed well-nourished female no acute distress vital signs stable she's afebrile BP 180/110  Abdominal exam shows no bruit      Assessment & Plan:  Hypertension question need to adjust medicine versus recurrence of the renal artery stenosis........ Increase the medication follow-up in one week if blood pressure does not normalize then will repeat her renal artery scan

## 2014-06-06 NOTE — Progress Notes (Signed)
Pre visit review using our clinic review tool, if applicable. No additional management support is needed unless otherwise documented below in the visit note. 

## 2014-06-14 ENCOUNTER — Ambulatory Visit (INDEPENDENT_AMBULATORY_CARE_PROVIDER_SITE_OTHER): Payer: Medicare Other | Admitting: Family Medicine

## 2014-06-14 ENCOUNTER — Encounter: Payer: Self-pay | Admitting: Family Medicine

## 2014-06-14 VITALS — BP 210/120 | Temp 98.0°F | Wt 186.0 lb

## 2014-06-14 DIAGNOSIS — Z87448 Personal history of other diseases of urinary system: Secondary | ICD-10-CM | POA: Diagnosis not present

## 2014-06-14 DIAGNOSIS — I10 Essential (primary) hypertension: Secondary | ICD-10-CM

## 2014-06-14 DIAGNOSIS — Z8679 Personal history of other diseases of the circulatory system: Secondary | ICD-10-CM | POA: Insufficient documentation

## 2014-06-14 DIAGNOSIS — Z5181 Encounter for therapeutic drug level monitoring: Secondary | ICD-10-CM

## 2014-06-14 LAB — CBC WITH DIFFERENTIAL/PLATELET
BASOS ABS: 0 10*3/uL (ref 0.0–0.1)
Basophils Relative: 0.5 % (ref 0.0–3.0)
EOS ABS: 0.3 10*3/uL (ref 0.0–0.7)
Eosinophils Relative: 4.7 % (ref 0.0–5.0)
HCT: 40.2 % (ref 36.0–46.0)
Hemoglobin: 13.8 g/dL (ref 12.0–15.0)
LYMPHS PCT: 20.1 % (ref 12.0–46.0)
Lymphs Abs: 1.2 10*3/uL (ref 0.7–4.0)
MCHC: 34.2 g/dL (ref 30.0–36.0)
MCV: 89.7 fl (ref 78.0–100.0)
Monocytes Absolute: 0.5 10*3/uL (ref 0.1–1.0)
Monocytes Relative: 9.1 % (ref 3.0–12.0)
NEUTROS PCT: 65.6 % (ref 43.0–77.0)
Neutro Abs: 3.9 10*3/uL (ref 1.4–7.7)
Platelets: 233 10*3/uL (ref 150.0–400.0)
RBC: 4.48 Mil/uL (ref 3.87–5.11)
RDW: 12.6 % (ref 11.5–15.5)
WBC: 5.9 10*3/uL (ref 4.0–10.5)

## 2014-06-14 LAB — BASIC METABOLIC PANEL
BUN: 21 mg/dL (ref 6–23)
CO2: 31 mEq/L (ref 19–32)
CREATININE: 1.6 mg/dL — AB (ref 0.4–1.2)
Calcium: 11.5 mg/dL — ABNORMAL HIGH (ref 8.4–10.5)
Chloride: 104 mEq/L (ref 96–112)
GFR: 32.42 mL/min — ABNORMAL LOW (ref >60.00)
Glucose, Bld: 83 mg/dL (ref 70–99)
POTASSIUM: 3.8 meq/L (ref 3.5–5.1)
Sodium: 144 mEq/L (ref 135–145)

## 2014-06-14 LAB — PROTIME-INR
INR: 0.9 ratio (ref 0.8–1.0)
Prothrombin Time: 10.3 s (ref 9.6–13.1)

## 2014-06-14 NOTE — Patient Instructions (Signed)
Rest at home  Labs today  Lisinopril 10 mg....... 2 tabs twice daily  We will get you set up for a scan of your kidneys ASAP

## 2014-06-14 NOTE — Progress Notes (Signed)
   Subjective:    Patient ID: Michelle [name], female    DOB: June 23, 1941, 73 y.o.   MRN: [id number]  HPI Aylen is a 73 year old married female nonsmoker who comes in today for evaluation of hypertension  About 30 years ago she came in with hypertension we found she had a right and left renal artery lesion. The right renal artery lesion was dilated and stented by Dr. Kathlene Cote. She's done well since that time and her blood pressures been normal on small doses of lisinopril.  Over the past couple weeks her blood pressures been jumping up and down. BP here today to 10/90   Review of Systems    review of systems otherwise negative Objective:   Physical Exam  Well-developed well-nourished female no acute distress blood pressure right arm sitting position to 10/90      Assessment & Plan:  Hypertension with history of renal artery stenosis........ now with increasing hypertension...Marland KitchenMarland Kitchen Probably restenosis......... set up for evaluation ASAP

## 2014-06-14 NOTE — Progress Notes (Signed)
Pre visit review using our clinic review tool, if applicable. No additional management support is needed unless otherwise documented below in the visit note. 

## 2014-06-19 ENCOUNTER — Other Ambulatory Visit: Payer: Self-pay | Admitting: Family Medicine

## 2014-06-19 DIAGNOSIS — Z8679 Personal history of other diseases of the circulatory system: Secondary | ICD-10-CM

## 2014-06-19 DIAGNOSIS — I10 Essential (primary) hypertension: Secondary | ICD-10-CM

## 2014-06-28 ENCOUNTER — Telehealth: Payer: Self-pay | Admitting: Family Medicine

## 2014-06-28 ENCOUNTER — Ambulatory Visit
Admission: RE | Admit: 2014-06-28 | Discharge: 2014-06-28 | Disposition: A | Discharge status: Home / Self Care | Payer: Medicare Other | Source: Ambulatory Visit | Attending: Family Medicine | Admitting: Family Medicine

## 2014-06-28 DIAGNOSIS — Z8679 Personal history of other diseases of the circulatory system: Secondary | ICD-10-CM

## 2014-06-28 DIAGNOSIS — I7789 Other specified disorders of arteries and arterioles: Secondary | ICD-10-CM | POA: Diagnosis not present

## 2014-06-28 DIAGNOSIS — I10 Essential (primary) hypertension: Secondary | ICD-10-CM

## 2014-06-28 NOTE — Telephone Encounter (Signed)
Pt called to say that she had a scan today and is asking what the next time will be and would like a call back .

## 2014-06-28 NOTE — Telephone Encounter (Signed)
Patient is aware of result and an appointment made.

## 2014-07-03 ENCOUNTER — Encounter: Payer: Self-pay | Admitting: Family Medicine

## 2014-07-03 ENCOUNTER — Ambulatory Visit (INDEPENDENT_AMBULATORY_CARE_PROVIDER_SITE_OTHER): Payer: Medicare Other | Admitting: Family Medicine

## 2014-07-03 VITALS — BP 240/110 | Temp 98.5°F | Wt 185.8 lb

## 2014-07-03 DIAGNOSIS — I10 Essential (primary) hypertension: Secondary | ICD-10-CM | POA: Diagnosis not present

## 2014-07-03 MED ORDER — AMLODIPINE BESYLATE 5 MG PO TABS
5.0000 mg | ORAL_TABLET | Freq: Every day | ORAL | Status: DC
Start: 2014-07-03 — End: 2017-04-21

## 2014-07-03 MED ORDER — CLONIDINE HCL 0.2 MG PO TABS: 0.2000 mg | ORAL_TABLET | Freq: Two times a day (BID) | ORAL | Status: DC

## 2014-07-03 NOTE — Progress Notes (Signed)
Pre visit review using our clinic review tool, if applicable. No additional management support is needed unless otherwise documented below in the visit note. 

## 2014-07-03 NOTE — Progress Notes (Signed)
   Subjective:    Patient ID: Michelle [name], female    DOB: 1941-04-23, 72 y.o.   MRN: [id number]  HPI Michelle [name] is a 73 year old female who has a long-standing history of hypertension. Years ago when her blood pressure first went up we did a renal artery scan which showed a right renal artery stenosis. She had PTCA to that artery and her blood pressure has been controlled for many years on small doses of an ACE inhibitor. Recently her blood pressure went up. We rescanned her renal arteries and on ultrasound they appear normal however her blood pressure continues to rise. We increased her ACE inhibitor to 40 mg daily and double her Aldactone to 25 mg twice a day despite that her BP today when she came in was 240/110 right arm sitting position 10 minutes later when I checked it was 230/110.  I gave her clonidine 0.4 and Benicar 50 mg,,,,,,,, resting in the office BP check every 15 minutes until we see her blood pressure start to go back to normal.   Review of Systems Review of systems otherwise negative    Objective:   Physical Exam  Well-developed well-nourished female no acute distress vital signs stable she's afebrile except BP 1 240/110 right arm sitting position,,,,,,,,,,,, clonidine and Benicar as above,,,,,,, BP check every 15 minutes      Assessment & Plan:  Hypertension accelerated,,,,,,,,,, increase lisinopril,,,,,,,,, add clonidine,,,,,,,, add Norvasc,,,,,,,,, patient says she can't tolerate a beta blocker gave her a migraine headache,,,,,,,,, bed rest at home until blood pressure comes down to normal. Also consult with Dr. Nelda Severe,,,,,,,,, nephrologist

## 2014-07-03 NOTE — Patient Instructions (Signed)
Bedrest at home  Blood pressure check 3 times daily  Lisinopril 20 mg....... one twice daily  Clonidine 0.2 mg........ one twice daily  Norvasc 5 mg.......Marland Kitchen 1 daily in the morning  Return on Thursday for follow-up  We will also set up a consult to see Dr. Jeneen Rinks D,,,,,,,, nephrologist,,,,,,,,,, for help lowering her blood pressure

## 2014-07-05 ENCOUNTER — Encounter: Payer: Self-pay | Admitting: Family Medicine

## 2014-07-05 ENCOUNTER — Other Ambulatory Visit: Payer: Self-pay | Admitting: Family Medicine

## 2014-07-05 ENCOUNTER — Ambulatory Visit (INDEPENDENT_AMBULATORY_CARE_PROVIDER_SITE_OTHER): Payer: Medicare Other | Admitting: Family Medicine

## 2014-07-05 VITALS — BP 160/88 | Temp 97.7°F | Wt 186.0 lb

## 2014-07-05 DIAGNOSIS — I10 Essential (primary) hypertension: Secondary | ICD-10-CM | POA: Diagnosis not present

## 2014-07-05 DIAGNOSIS — Z8679 Personal history of other diseases of the circulatory system: Secondary | ICD-10-CM

## 2014-07-05 MED ORDER — LISINOPRIL 20 MG PO TABS: ORAL_TABLET | ORAL | Status: DC

## 2014-07-05 NOTE — Progress Notes (Signed)
   Subjective:    Patient ID: Karl Pock, female    DOB: [birth year], 73 y.o.   MRN: [id number]  HPI Margart is a 73 year old female who comes in today for follow-up of malignant hypertension  We been working with her over the last couple weeks because her blood pressure which he originally been controlled on 10 mg of lisinopril daily accelerated markedly. She had a renal artery stenosis and a PTCA about 30+ years ago. Repeat a renal artery scan which shows no renal artery stenosis. She came in 48 hours ago her blood pressures 230/110. We kept her here for an hour got her pressure down with clonidine and send her home at bed rest. We added Norvasc 5 mg daily, Catapres 0.2 twice a day, increased her lisinopril to 20 twice a day, increased her Aldactone to 1 twice a day. Her blood pressure today is 160/88. She's gotten some normal blood pressures,,,,,,,, 130/80 at home.   Review of Systems    review of systems otherwise negative Objective:   Physical Exam  Well-developed well-nourished female no acute distress vital signs stable she's afebrile BP down to 160/88 today      Assessment & Plan:  Malignant hypertension.....Marland Kitchen improving with the addition of the above medication..... Allowed ambulate......Marland Kitchen monitor blood pressure 3 times daily....... go back to bed rest on Saturday and Sunday for blood pressure begins to go up again.... Follow-up Monday afternoon

## 2014-07-05 NOTE — Progress Notes (Signed)
Pre visit review using our clinic review tool, if applicable. No additional management support is needed unless otherwise documented below in the visit note. 

## 2014-07-05 NOTE — Patient Instructions (Signed)
Okay to ambulate  Check your blood pressure 3 times daily  Continue current medication  If you see your blood pressure beginning to go up again stay at bedrest Saturday and Sunday,,,,,,,,,, and call me on my cell phone [phone]  Return Monday afternoon for follow-up

## 2014-07-06 ENCOUNTER — Other Ambulatory Visit (INDEPENDENT_AMBULATORY_CARE_PROVIDER_SITE_OTHER): Payer: Medicare Other

## 2014-07-06 DIAGNOSIS — Z87448 Personal history of other diseases of urinary system: Secondary | ICD-10-CM

## 2014-07-06 DIAGNOSIS — Z8679 Personal history of other diseases of the circulatory system: Secondary | ICD-10-CM

## 2014-07-06 DIAGNOSIS — I10 Essential (primary) hypertension: Secondary | ICD-10-CM | POA: Diagnosis not present

## 2014-07-06 LAB — BASIC METABOLIC PANEL
BUN: 16 mg/dL (ref 6–23)
CHLORIDE: 105 meq/L (ref 96–112)
CO2: 31 meq/L (ref 19–32)
CREATININE: 1.3 mg/dL — AB (ref 0.4–1.2)
Calcium: 10.2 mg/dL (ref 8.4–10.5)
GFR: 42.01 mL/min — ABNORMAL LOW (ref >60.00)
Glucose, Bld: 61 mg/dL — ABNORMAL LOW (ref 70–99)
POTASSIUM: 3.3 meq/L — AB (ref 3.5–5.1)
Sodium: 140 mEq/L (ref 135–145)

## 2014-07-09 ENCOUNTER — Ambulatory Visit (INDEPENDENT_AMBULATORY_CARE_PROVIDER_SITE_OTHER): Payer: Medicare Other | Admitting: Family Medicine

## 2014-07-09 ENCOUNTER — Encounter: Payer: Medicare Other | Admitting: *Deleted

## 2014-07-09 ENCOUNTER — Encounter (INDEPENDENT_AMBULATORY_CARE_PROVIDER_SITE_OTHER): Payer: Medicare Other | Admitting: Ophthalmology

## 2014-07-09 VITALS — BP 194/110 | HR 58

## 2014-07-09 DIAGNOSIS — H35033 Hypertensive retinopathy, bilateral: Secondary | ICD-10-CM

## 2014-07-09 DIAGNOSIS — H43813 Vitreous degeneration, bilateral: Secondary | ICD-10-CM

## 2014-07-09 DIAGNOSIS — I10 Essential (primary) hypertension: Secondary | ICD-10-CM | POA: Diagnosis not present

## 2014-07-09 DIAGNOSIS — H3531 Nonexudative age-related macular degeneration: Secondary | ICD-10-CM | POA: Diagnosis not present

## 2014-07-09 DIAGNOSIS — H3532 Exudative age-related macular degeneration: Secondary | ICD-10-CM

## 2014-07-09 MED ORDER — POTASSIUM CHLORIDE CRYS ER 20 MEQ PO TBCR: 20.0000 meq | EXTENDED_RELEASE_TABLET | Freq: Every day | ORAL | Status: DC

## 2014-07-09 NOTE — Progress Notes (Signed)
   Subjective:    Patient ID: Karl Pock, female    DOB: 07-14-1941, 73 y.o.   MRN: [id number]  HPI Manaia is a 73 year old married female nonsmoker who comes in today for evaluation of hypertension  His noticed previously she had a PTCA to her right renal artery 30+ years ago. Her blood pressures maintained on 10 mg of an ACE inhibitor since that time. Recently a went up. We rescanned her kidneys and her renal arteries appeared normal. We've increased her medication her blood pressure still elevated. This morning is 140/90 now has to 40/100   Review of Systems    review of systems otherwise negative except she stopped the clonidine because she couldn't tolerate the visual changes Objective:   Physical Exam  Well-developed well-nourished female no acute distress vital signs stable she's afebrile the P right arm sitting position to 20/100     Assessment & Plan:  Hypertension not at goal....... double the Norvasc continue the other medications follow-up on Thursday

## 2014-07-09 NOTE — Patient Instructions (Signed)
Norvasc 5 mg.......... one twice daily  Aldactone........ one twice daily  Clonidine.........Marland Kitchen 1 at bedtime  Lisinopril...Marland KitchenMarland KitchenMarland Kitchen 30 mg in the morning........ 20 mg at bedtime  BP check 3 times a day  Return on Thursday for follow-up  Potassium,,, 20 mEq.... One tablet daily

## 2014-07-09 NOTE — Progress Notes (Signed)
Pre visit review using our clinic review tool, if applicable. No additional management support is needed unless otherwise documented below in the visit note. 

## 2014-07-12 ENCOUNTER — Ambulatory Visit (INDEPENDENT_AMBULATORY_CARE_PROVIDER_SITE_OTHER): Payer: Medicare Other | Admitting: Family Medicine

## 2014-07-12 ENCOUNTER — Encounter: Payer: Self-pay | Admitting: Family Medicine

## 2014-07-12 VITALS — BP 190/90

## 2014-07-12 DIAGNOSIS — N183 Chronic kidney disease, stage 3 (moderate): Secondary | ICD-10-CM | POA: Diagnosis not present

## 2014-07-12 DIAGNOSIS — I7789 Other specified disorders of arteries and arterioles: Secondary | ICD-10-CM | POA: Diagnosis not present

## 2014-07-12 DIAGNOSIS — I10 Essential (primary) hypertension: Secondary | ICD-10-CM

## 2014-07-12 DIAGNOSIS — H3532 Exudative age-related macular degeneration: Secondary | ICD-10-CM | POA: Diagnosis not present

## 2014-07-12 NOTE — Progress Notes (Signed)
   Subjective:    Patient ID: Michelle [name], female    DOB: 04/26/1941, 73 y.o.   MRN: [id number]  HPI Michelle [name] is a 73 year old female who comes in today for follow-up of hypertension  Is that his previously we've been working with over the last couple weeks to get her blood pressure down to normal.  As noted previously she had a right renal artery stenosis which was dilated about 30 years ago. Since that time her blood pressures been well-maintained on 10 mg of lisinopril a day. Recently her blood pressure went up. We rescanned her kidneys ultrasound shows no evidence of recurrent renal artery stenosis.  She's currently on Norvasc 5 mg twice a day, Catapres 0.2 daily, lisinopril 20 mg twice a day, potassium 20 mEq daily, Aldactone one twice daily  Her BP here this morning is 190/90. Her blood pressures at home are also elevated.  Fortunately have an appointment for her to see Dr. Loletha Grayer at the nephrology clinic today.   Review of Systems Review of systems otherwise negative    Objective:   Physical Exam  Well-developed well-nourished female no acute distress vital signs stable except for BP 190/90 right arm sitting position      Assessment & Plan:  Hypertension not at goal,,,,,,, consult with Dr. Loletha Grayer at the nephrology clinic today

## 2014-07-12 NOTE — Patient Instructions (Signed)
Take all your medication bottles and medical records when you see Dr.C today

## 2014-07-12 NOTE — Progress Notes (Signed)
Pre visit review using our clinic review tool, if applicable. No additional management support is needed unless otherwise documented below in the visit note. 

## 2014-08-02 DIAGNOSIS — I10 Essential (primary) hypertension: Secondary | ICD-10-CM | POA: Diagnosis not present

## 2014-08-06 DIAGNOSIS — I10 Essential (primary) hypertension: Secondary | ICD-10-CM | POA: Diagnosis not present

## 2014-08-08 DIAGNOSIS — I10 Essential (primary) hypertension: Secondary | ICD-10-CM | POA: Diagnosis not present

## 2014-08-08 DIAGNOSIS — H409 Unspecified glaucoma: Secondary | ICD-10-CM | POA: Diagnosis not present

## 2014-08-08 DIAGNOSIS — H3530 Unspecified macular degeneration: Secondary | ICD-10-CM | POA: Diagnosis not present

## 2014-08-08 DIAGNOSIS — I773 Arterial fibromuscular dysplasia: Secondary | ICD-10-CM | POA: Diagnosis not present

## 2014-08-13 ENCOUNTER — Encounter (INDEPENDENT_AMBULATORY_CARE_PROVIDER_SITE_OTHER): Payer: Medicare Other | Admitting: Ophthalmology

## 2014-08-13 DIAGNOSIS — H43813 Vitreous degeneration, bilateral: Secondary | ICD-10-CM | POA: Diagnosis not present

## 2014-08-13 DIAGNOSIS — H35033 Hypertensive retinopathy, bilateral: Secondary | ICD-10-CM

## 2014-08-13 DIAGNOSIS — I10 Essential (primary) hypertension: Secondary | ICD-10-CM

## 2014-08-13 DIAGNOSIS — H3531 Nonexudative age-related macular degeneration: Secondary | ICD-10-CM | POA: Diagnosis not present

## 2014-08-13 DIAGNOSIS — H3532 Exudative age-related macular degeneration: Secondary | ICD-10-CM

## 2014-09-07 ENCOUNTER — Encounter (INDEPENDENT_AMBULATORY_CARE_PROVIDER_SITE_OTHER): Payer: Medicare Other | Admitting: Ophthalmology

## 2014-09-07 DIAGNOSIS — I10 Essential (primary) hypertension: Secondary | ICD-10-CM

## 2014-09-07 DIAGNOSIS — H43813 Vitreous degeneration, bilateral: Secondary | ICD-10-CM | POA: Diagnosis not present

## 2014-09-07 DIAGNOSIS — H3532 Exudative age-related macular degeneration: Secondary | ICD-10-CM

## 2014-09-07 DIAGNOSIS — H35033 Hypertensive retinopathy, bilateral: Secondary | ICD-10-CM | POA: Diagnosis not present

## 2014-09-07 DIAGNOSIS — H3531 Nonexudative age-related macular degeneration: Secondary | ICD-10-CM | POA: Diagnosis not present

## 2014-09-10 ENCOUNTER — Encounter (INDEPENDENT_AMBULATORY_CARE_PROVIDER_SITE_OTHER): Payer: Medicare Other | Admitting: Ophthalmology

## 2014-10-08 DIAGNOSIS — N183 Chronic kidney disease, stage 3 (moderate): Secondary | ICD-10-CM | POA: Diagnosis not present

## 2014-10-08 DIAGNOSIS — I10 Essential (primary) hypertension: Secondary | ICD-10-CM | POA: Diagnosis not present

## 2014-10-08 DIAGNOSIS — I7789 Other specified disorders of arteries and arterioles: Secondary | ICD-10-CM | POA: Diagnosis not present

## 2014-10-08 DIAGNOSIS — H3532 Exudative age-related macular degeneration: Secondary | ICD-10-CM | POA: Diagnosis not present

## 2014-10-11 ENCOUNTER — Other Ambulatory Visit (HOSPITAL_COMMUNITY): Payer: Self-pay | Admitting: Nephrology

## 2014-10-11 ENCOUNTER — Telehealth (HOSPITAL_COMMUNITY): Payer: Self-pay | Admitting: Interventional Radiology

## 2014-10-11 ENCOUNTER — Other Ambulatory Visit (HOSPITAL_COMMUNITY): Payer: Self-pay | Admitting: Interventional Radiology

## 2014-10-11 DIAGNOSIS — I10 Essential (primary) hypertension: Secondary | ICD-10-CM

## 2014-10-11 NOTE — Telephone Encounter (Signed)
Called pt, left VM for her to call me back to schedule her bilateral renal angio JM

## 2014-10-12 ENCOUNTER — Other Ambulatory Visit: Payer: Self-pay | Admitting: Radiology

## 2014-10-15 ENCOUNTER — Encounter (HOSPITAL_COMMUNITY): Payer: Self-pay

## 2014-10-15 ENCOUNTER — Ambulatory Visit (HOSPITAL_COMMUNITY)
Admission: RE | Admit: 2014-10-15 | Discharge: 2014-10-15 | Disposition: A | Discharge status: Home / Self Care | Payer: Medicare Other | Source: Ambulatory Visit | Attending: Interventional Radiology | Admitting: Interventional Radiology

## 2014-10-15 DIAGNOSIS — R7989 Other specified abnormal findings of blood chemistry: Secondary | ICD-10-CM | POA: Diagnosis not present

## 2014-10-15 DIAGNOSIS — Z9862 Peripheral vascular angioplasty status: Secondary | ICD-10-CM | POA: Insufficient documentation

## 2014-10-15 DIAGNOSIS — I773 Arterial fibromuscular dysplasia: Secondary | ICD-10-CM | POA: Insufficient documentation

## 2014-10-15 DIAGNOSIS — Z5309 Procedure and treatment not carried out because of other contraindication: Secondary | ICD-10-CM | POA: Insufficient documentation

## 2014-10-15 DIAGNOSIS — Z79899 Other long term (current) drug therapy: Secondary | ICD-10-CM | POA: Insufficient documentation

## 2014-10-15 DIAGNOSIS — M1990 Unspecified osteoarthritis, unspecified site: Secondary | ICD-10-CM | POA: Insufficient documentation

## 2014-10-15 DIAGNOSIS — Z7982 Long term (current) use of aspirin: Secondary | ICD-10-CM | POA: Diagnosis not present

## 2014-10-15 DIAGNOSIS — M810 Age-related osteoporosis without current pathological fracture: Secondary | ICD-10-CM | POA: Insufficient documentation

## 2014-10-15 DIAGNOSIS — J309 Allergic rhinitis, unspecified: Secondary | ICD-10-CM | POA: Insufficient documentation

## 2014-10-15 DIAGNOSIS — I10 Essential (primary) hypertension: Secondary | ICD-10-CM | POA: Insufficient documentation

## 2014-10-15 HISTORY — DX: Arterial fibromuscular dysplasia: I77.3

## 2014-10-15 LAB — CBC
HEMATOCRIT: 35.3 % — AB (ref 36.0–46.0)
Hemoglobin: 12.5 g/dL (ref 12.0–15.0)
MCH: 30.9 pg (ref 26.0–34.0)
MCHC: 35.4 g/dL (ref 30.0–36.0)
MCV: 87.4 fL (ref 78.0–100.0)
PLATELETS: 239 10*3/uL (ref 150–400)
RBC: 4.04 MIL/uL (ref 3.87–5.11)
RDW: 12.9 % (ref 11.5–15.5)
WBC: 8.9 10*3/uL (ref 4.0–10.5)

## 2014-10-15 LAB — BASIC METABOLIC PANEL
ANION GAP: 11 (ref 5–15)
BUN: 30 mg/dL — ABNORMAL HIGH (ref 6–23)
CO2: 21 mmol/L (ref 19–32)
Calcium: 11 mg/dL — ABNORMAL HIGH (ref 8.4–10.5)
Chloride: 103 mmol/L (ref 96–112)
Creatinine, Ser: 1.72 mg/dL — ABNORMAL HIGH (ref 0.50–1.10)
GFR calc Af Amer: 32 mL/min — ABNORMAL LOW (ref >90)
GFR calc non Af Amer: 28 mL/min — ABNORMAL LOW (ref >90)
GLUCOSE: 196 mg/dL — AB (ref 70–99)
Potassium: 4.7 mmol/L (ref 3.5–5.1)
SODIUM: 135 mmol/L (ref 135–145)

## 2014-10-15 LAB — PROTIME-INR
INR: 0.99 (ref 0.00–1.49)
Prothrombin Time: 13.2 seconds (ref 11.6–15.2)

## 2014-10-15 MED ORDER — MIDAZOLAM HCL 2 MG/2ML IJ SOLN
INTRAMUSCULAR | Status: AC
  Filled 2014-10-15: qty 4

## 2014-10-15 MED ORDER — FENTANYL CITRATE 0.05 MG/ML IJ SOLN
INTRAMUSCULAR | Status: AC
  Filled 2014-10-15: qty 4

## 2014-10-15 MED ORDER — SODIUM CHLORIDE 0.9 % IV SOLN
INTRAVENOUS | Status: DC
  Administered 2014-10-15: 10:00:00 via INTRAVENOUS

## 2014-10-15 NOTE — H&P (Signed)
Chief Complaint: "I'm here for a renal angiogram" Referring Physician:Jeffery Sherren Mocha, MD HPI: Michelle [name] is an 74 y.o. female with hx of uncontrolled HTN. She also has remote hx of known FMD and actually has had right renal angioplasty in 1981. She underwent renal artery duplex, which did not find significant stenosis by duplex criteria. She is now scheduled for renal arteriogram and possible intervention if stenosis seen. She feels well this am, no complaint. Has been NPO except meds. Pt with hx of contrast allergy(Hives) and has taken pre-meds as directed.  Past Medical History:  Past Medical History  Diagnosis Date  . PMS (premenstrual syndrome)   . Allergic rhinitis   . Osteoarthritis   . Hypertension   . Retinal hemorrhage, right     with macular degeneration  . Osteoporosis   . Cystocele   . Fibromuscular dysplasia of renal artery     Past Surgical History:  Past Surgical History  Procedure Laterality Date  . Cb x 3    . Hemorrhoid surgery    . Tonsillectomy and adenoidectomy    . Rt ovarian cyst removed    . Btl    . Angioplasty      right renal 1981    Family History: History reviewed. No pertinent family history.  Social History:  reports that she has quit smoking. She quit smokeless tobacco use about 30 years ago. She reports that she does not drink alcohol or use illicit drugs.  Allergies:  Allergies  Allergen Reactions  . Diphenhydramine Hcl     REACTION: Hives  . Iohexol Hives    Medications:   Medication List    ASK your doctor about these medications        acetaminophen 500 MG tablet  Commonly known as:  TYLENOL  Take 1,000 mg by mouth every 8 (eight) hours as needed for mild pain or headache.     amLODipine 5 MG tablet  Commonly known as:  NORVASC  Take 1 tablet (5 mg total) by mouth daily.     aspirin 81 MG tablet  Take 81 mg by mouth daily.     AVASTIN 100 MG/4ML Soln  Generic drug:  Bevacizumab  - Inject into the vein. For  eyes  - One every 5 weeks 1/10 mg     beta carotene w/minerals tablet  Take 1 tablet by mouth daily.     calcium carbonate 500 MG chewable tablet  Commonly known as:  TUMS - dosed in mg elemental calcium  Chew 1 tablet by mouth daily as needed for indigestion or heartburn.     cetirizine 10 MG tablet  Commonly known as:  ZYRTEC  Take 10 mg by mouth as needed.     cloNIDine 0.2 MG tablet  Commonly known as:  CATAPRES  Take 1 tablet (0.2 mg total) by mouth 2 (two) times daily.     dorzolamide 2 % ophthalmic solution  Commonly known as:  TRUSOPT  Place 2 drops into the left eye daily.     fluocinonide cream 0.05 %  Commonly known as:  LIDEX  Apply 1 application topically at bedtime.     lisinopril 20 MG tablet  Commonly known as:  PRINIVIL,ZESTRIL  One by mouth twice a day     potassium chloride SA 20 MEQ tablet  Commonly known as:  K-DUR,KLOR-CON  Take 1 tablet (20 mEq total) by mouth daily.     spironolactone 25 MG tablet  Commonly known as:  ALDACTONE  TAKE 1 TAB twice daily        Please HPI for pertinent positives, otherwise complete 10 system ROS negative.  Physical Exam: BP 120/62 mmHg  Pulse 85  Temp(Src) 97.6 F (36.4 C) (Oral)  Resp 18  Ht 5' 4"  (1.626 m)  Wt 180 lb (81.647 kg)  BMI 30.88 kg/m2  SpO2 100% Body mass index is 30.88 kg/(m^2).   General Appearance:  Alert, cooperative, no distress, appears stated age  Head:  Normocephalic, without obvious abnormality, atraumatic  ENT: Unremarkable airway  Neck: Supple, symmetrical, trachea midline  Lungs:   Clear to auscultation bilaterally, no w/r/r  Chest Wall:  No tenderness or deformity  Heart:  Regular rate and rhythm, S1, S2 normal, no murmur, rub or gallop.  Extremities: Extremities normal, atraumatic, no cyanosis or edema  Pulses: 2+ and symmetric femoral and pedal  Neurologic: Normal affect, no gross deficits.  Labs: Results for orders placed or performed during the hospital encounter of  10/15/14 (from the past 48 hour(s))  Basic metabolic panel     Status: Abnormal   Collection Time: 10/15/14  9:30 AM  Result Value Ref Range   Sodium 135 135 - 145 mmol/L   Potassium 4.7 3.5 - 5.1 mmol/L   Chloride 103 96 - 112 mmol/L   CO2 21 19 - 32 mmol/L   Glucose, Bld 196 (H) 70 - 99 mg/dL   BUN 30 (H) 6 - 23 mg/dL   Creatinine, Ser 1.72 (H) 0.50 - 1.10 mg/dL   Calcium 11.0 (H) 8.4 - 10.5 mg/dL   GFR calc non Af Amer 28 (L) >90 mL/min   GFR calc Af Amer 32 (L) >90 mL/min    Comment: (NOTE) The eGFR has been calculated using the CKD EPI equation. This calculation has not been validated in all clinical situations. eGFR's persistently <90 mL/min signify possible Chronic Kidney Disease.    Anion gap 11 5 - 15  CBC     Status: Abnormal   Collection Time: 10/15/14  9:30 AM  Result Value Ref Range   WBC 8.9 4.0 - 10.5 K/uL   RBC 4.04 3.87 - 5.11 MIL/uL   Hemoglobin 12.5 12.0 - 15.0 g/dL   HCT 35.3 (L) 36.0 - 46.0 %   MCV 87.4 78.0 - 100.0 fL   MCH 30.9 26.0 - 34.0 pg   MCHC 35.4 30.0 - 36.0 g/dL   RDW 12.9 11.5 - 15.5 %   Platelets 239 150 - 400 K/uL  Protime-INR     Status: None   Collection Time: 10/15/14  9:30 AM  Result Value Ref Range   Prothrombin Time 13.2 11.6 - 15.2 seconds   INR 0.99 0.00 - 1.49    Imaging: No results found.  Assessment/Plan Uncontrolled HTN Prior hx of FMD For Renal arteriogram, with possible stent/balloon angioplasty if indicated. Labs reviewed, elevated Cr up to 1.7. Need to d/w pt Nephrologist, may need to postpone procedure. Explained procedure, risks and complications including infection, bleeding, contrast reaction, and adverse effects of sedation. Consent signed in chart  Ascencion Dike PA-C 10/15/2014, 10:43 AM

## 2014-10-17 DIAGNOSIS — I10 Essential (primary) hypertension: Secondary | ICD-10-CM | POA: Diagnosis not present

## 2014-10-25 DIAGNOSIS — N183 Chronic kidney disease, stage 3 (moderate): Secondary | ICD-10-CM | POA: Diagnosis not present

## 2014-11-05 ENCOUNTER — Encounter (INDEPENDENT_AMBULATORY_CARE_PROVIDER_SITE_OTHER): Payer: Medicare Other | Admitting: Ophthalmology

## 2014-11-05 DIAGNOSIS — H35033 Hypertensive retinopathy, bilateral: Secondary | ICD-10-CM

## 2014-11-05 DIAGNOSIS — I10 Essential (primary) hypertension: Secondary | ICD-10-CM | POA: Diagnosis not present

## 2014-11-05 DIAGNOSIS — H3532 Exudative age-related macular degeneration: Secondary | ICD-10-CM | POA: Diagnosis not present

## 2014-11-05 DIAGNOSIS — H3531 Nonexudative age-related macular degeneration: Secondary | ICD-10-CM

## 2014-11-05 DIAGNOSIS — H43813 Vitreous degeneration, bilateral: Secondary | ICD-10-CM

## 2014-11-07 ENCOUNTER — Other Ambulatory Visit (HOSPITAL_COMMUNITY): Payer: Self-pay | Admitting: Interventional Radiology

## 2014-11-16 ENCOUNTER — Other Ambulatory Visit: Payer: Self-pay | Admitting: Radiology

## 2014-11-19 ENCOUNTER — Encounter (HOSPITAL_COMMUNITY): Payer: Self-pay

## 2014-11-19 ENCOUNTER — Ambulatory Visit (HOSPITAL_COMMUNITY)
Admission: RE | Admit: 2014-11-19 | Discharge: 2014-11-19 | Disposition: A | Discharge status: Home / Self Care | Payer: Medicare Other | Source: Ambulatory Visit | Attending: Interventional Radiology | Admitting: Interventional Radiology

## 2014-11-19 ENCOUNTER — Other Ambulatory Visit (HOSPITAL_COMMUNITY): Payer: Self-pay | Admitting: Nephrology

## 2014-11-19 DIAGNOSIS — I701 Atherosclerosis of renal artery: Secondary | ICD-10-CM

## 2014-11-19 DIAGNOSIS — Z79899 Other long term (current) drug therapy: Secondary | ICD-10-CM | POA: Insufficient documentation

## 2014-11-19 DIAGNOSIS — G7102 Facioscapulohumeral muscular dystrophy: Secondary | ICD-10-CM

## 2014-11-19 DIAGNOSIS — I773 Arterial fibromuscular dysplasia: Secondary | ICD-10-CM | POA: Insufficient documentation

## 2014-11-19 DIAGNOSIS — I10 Essential (primary) hypertension: Secondary | ICD-10-CM | POA: Diagnosis not present

## 2014-11-19 DIAGNOSIS — M810 Age-related osteoporosis without current pathological fracture: Secondary | ICD-10-CM | POA: Insufficient documentation

## 2014-11-19 DIAGNOSIS — Z7982 Long term (current) use of aspirin: Secondary | ICD-10-CM | POA: Diagnosis not present

## 2014-11-19 DIAGNOSIS — M1990 Unspecified osteoarthritis, unspecified site: Secondary | ICD-10-CM | POA: Insufficient documentation

## 2014-11-19 DIAGNOSIS — Z87891 Personal history of nicotine dependence: Secondary | ICD-10-CM | POA: Diagnosis not present

## 2014-11-19 DIAGNOSIS — Z9862 Peripheral vascular angioplasty status: Secondary | ICD-10-CM | POA: Insufficient documentation

## 2014-11-19 LAB — CBC WITH DIFFERENTIAL/PLATELET
BASOS ABS: 0 10*3/uL (ref 0.0–0.1)
BASOS PCT: 0 % (ref 0–1)
Eosinophils Absolute: 0 10*3/uL (ref 0.0–0.7)
Eosinophils Relative: 0 % (ref 0–5)
HEMATOCRIT: 37 % (ref 36.0–46.0)
Hemoglobin: 13.2 g/dL (ref 12.0–15.0)
LYMPHS ABS: 0.5 10*3/uL — AB (ref 0.7–4.0)
Lymphocytes Relative: 7 % — ABNORMAL LOW (ref 12–46)
MCH: 31.9 pg (ref 26.0–34.0)
MCHC: 35.7 g/dL (ref 30.0–36.0)
MCV: 89.4 fL (ref 78.0–100.0)
MONOS PCT: 0 % — AB (ref 3–12)
Monocytes Absolute: 0 10*3/uL — ABNORMAL LOW (ref 0.1–1.0)
Neutro Abs: 6.2 10*3/uL (ref 1.7–7.7)
Neutrophils Relative %: 93 % — ABNORMAL HIGH (ref 43–77)
PLATELETS: 211 10*3/uL (ref 150–400)
RBC: 4.14 MIL/uL (ref 3.87–5.11)
RDW: 13.4 % (ref 11.5–15.5)
WBC: 6.7 10*3/uL (ref 4.0–10.5)

## 2014-11-19 LAB — APTT: APTT: 27 s (ref 24–37)

## 2014-11-19 LAB — BASIC METABOLIC PANEL
Anion gap: 12 (ref 5–15)
BUN: 16 mg/dL (ref 6–23)
CO2: 20 mmol/L (ref 19–32)
Calcium: 10 mg/dL (ref 8.4–10.5)
Chloride: 105 mmol/L (ref 96–112)
Creatinine, Ser: 1.07 mg/dL (ref 0.50–1.10)
GFR calc non Af Amer: 49 mL/min — ABNORMAL LOW (ref >90)
GFR, EST AFRICAN AMERICAN: 57 mL/min — AB (ref >90)
Glucose, Bld: 186 mg/dL — ABNORMAL HIGH (ref 70–99)
POTASSIUM: 4 mmol/L (ref 3.5–5.1)
SODIUM: 137 mmol/L (ref 135–145)

## 2014-11-19 LAB — PROTIME-INR
INR: 0.98 (ref 0.00–1.49)
Prothrombin Time: 13.1 seconds (ref 11.6–15.2)

## 2014-11-19 MED ORDER — MIDAZOLAM HCL 2 MG/2ML IJ SOLN
INTRAMUSCULAR | Status: AC
  Filled 2014-11-19: qty 6

## 2014-11-19 MED ORDER — FENTANYL CITRATE (PF) 100 MCG/2ML IJ SOLN
INTRAMUSCULAR | Status: AC | PRN
  Administered 2014-11-19: 12.5 ug via INTRAVENOUS
  Administered 2014-11-19: 50 ug via INTRAVENOUS

## 2014-11-19 MED ORDER — SODIUM CHLORIDE 0.9 % IV SOLN
INTRAVENOUS | Status: DC
Start: 2014-11-19 — End: 2014-11-20

## 2014-11-19 MED ORDER — LIDOCAINE HCL 1 % IJ SOLN
INTRAMUSCULAR | Status: AC
  Filled 2014-11-19: qty 20

## 2014-11-19 MED ORDER — SODIUM CHLORIDE 0.9 % IV SOLN
Freq: Once | INTRAVENOUS | Status: AC
  Administered 2014-11-19: 08:00:00 via INTRAVENOUS

## 2014-11-19 MED ORDER — HEPARIN SODIUM (PORCINE) 1000 UNIT/ML IJ SOLN
INTRAMUSCULAR | Status: AC
  Filled 2014-11-19: qty 1

## 2014-11-19 MED ORDER — FENTANYL CITRATE (PF) 100 MCG/2ML IJ SOLN
INTRAMUSCULAR | Status: AC
  Filled 2014-11-19: qty 4

## 2014-11-19 MED ORDER — MIDAZOLAM HCL 2 MG/2ML IJ SOLN
INTRAMUSCULAR | Status: AC | PRN
  Administered 2014-11-19: 0.5 mg via INTRAVENOUS
  Administered 2014-11-19: 1 mg via INTRAVENOUS

## 2014-11-19 MED ORDER — HEPARIN SODIUM (PORCINE) 1000 UNIT/ML IJ SOLN
INTRAMUSCULAR | Status: AC | PRN
  Administered 2014-11-19: 3000 [IU] via INTRAVENOUS

## 2014-11-19 MED ORDER — IOHEXOL 300 MG/ML  SOLN
100.0000 mL | Freq: Once | INTRAMUSCULAR | Status: AC | PRN
  Administered 2014-11-19: 50 mL via INTRA_ARTERIAL

## 2014-11-19 NOTE — Discharge Instructions (Signed)
Arteriogram °Care After °These instructions give you information on caring for yourself after your procedure. Your doctor may also give you more specific instructions. Call your doctor if you have any problems or questions after your procedure. °HOME CARE °· Do not bathe, swim, or use a hot tub until directed by your doctor. You can shower. °· Do not lift anything heavier than 10 pounds (about a gallon of milk) for 2 days. °· Do not walk a lot, run, or drive for 2 days. °· Return to normal activities in 2 days or as told by your doctor. °Finding out the results of your test °Ask when your test results will be ready. Make sure you get your test results. °GET HELP RIGHT AWAY IF:  °· You have fever. °· You have more pain in your leg. °· The leg that was cut is: °¨ Bleeding. °¨ Puffy (swollen) or red. °¨ Cold. °¨ Pale or changes color. °¨ Weak. °¨ Tingly or numb. °If you go to the Emergency Room, tell your nurse that you have had an arteriogram. Take this paper with you to show the nurse. °MAKE SURE YOU: °· Understand these instructions. °· Will watch your condition. °· Will get help right away if you are not doing well or get worse. °Document Released: 10/09/2008 Document Revised: 07/18/2013 Document Reviewed: 10/09/2008 °Exit[employer]® Patient Information ©2015 [employer]. This information is not intended to replace advice given to you by your health care provider. Make sure you discuss any questions you have with your health care provider. ° °

## 2014-11-19 NOTE — Sedation Documentation (Signed)
R renal artery pressure, 128/60, (87); aorta pressure 122/92 (101)

## 2014-11-19 NOTE — H&P (Signed)
Referring Physician(s): Coladonato,Joseph  History of Present Illness: Michelle [name] is a 74 y.o. female with uncontrolled HTN and history of known FMD s/p right renal angioplasty in 1981. She has been scheduled today for a renal arteriogram with possible angioplasty/stenting, last renal duplex was on 06/2014 and revealed no stenosis, she states BP has been controlled lately with medical management. She denies any chest pain, shortness of breath or palpitations. She denies any active signs of bleeding or excessive bruising. She denies any recent fever or chills. The patient denies any history of sleep apnea or chronic oxygen use. She has previously tolerated sedation without complications.    Past Medical History  Diagnosis Date  . PMS (premenstrual syndrome)   . Allergic rhinitis   . Osteoarthritis   . Hypertension   . Retinal hemorrhage, right     with macular degeneration  . Osteoporosis   . Cystocele   . Fibromuscular dysplasia of renal artery     Past Surgical History  Procedure Laterality Date  . Cb x 3    . Hemorrhoid surgery    . Tonsillectomy and adenoidectomy    . Rt ovarian cyst removed    . Btl    . Angioplasty      right renal 1981    Allergies: Diphenhydramine hcl and Iohexol  Medications: Prior to Admission medications   Medication Sig Start Date End Date Taking? Authorizing Provider  acetaminophen (TYLENOL) 500 MG tablet Take 1,000 mg by mouth every 8 (eight) hours as needed for mild pain or headache.   Yes Historical Provider, MD  amLODipine (NORVASC) 5 MG tablet Take 1 tablet (5 mg total) by mouth daily. Patient taking differently: Take 5 mg by mouth 2 (two) times daily.  07/03/14  Yes Dorena Cookey, MD  aspirin 81 MG tablet Take 81 mg by mouth daily.   Yes Historical Provider, MD  beta carotene w/minerals (OCUVITE) tablet Take 1 tablet by mouth daily.   Yes Historical Provider, MD  Bevacizumab (AVASTIN) 100 MG/4ML SOLN Inject into the eye. For  eyes One every 12 weeks 1/10 mg   Yes Historical Provider, MD  cetirizine (ZYRTEC) 10 MG tablet Take 10 mg by mouth as needed.     Yes Historical Provider, MD  cloNIDine (CATAPRES) 0.2 MG tablet Take 1 tablet (0.2 mg total) by mouth 2 (two) times daily. Patient taking differently: Take 0.3 mg by mouth 2 (two) times daily.  07/03/14  Yes Dorena Cookey, MD  dorzolamide (TRUSOPT) 2 % ophthalmic solution Place 2 drops into the left eye daily.   Yes Historical Provider, MD  fluocinonide cream (LIDEX) 3.30 % Apply 1 application topically at bedtime. Patient taking differently: Apply 1 application topically at bedtime as needed.  10/31/12  Yes Dorena Cookey, MD  spironolactone (ALDACTONE) 25 MG tablet TAKE 1 TAB twice daily Patient taking differently: Take 25 mg by mouth daily.  06/06/14  Yes Dorena Cookey, MD  calcium carbonate (TUMS - DOSED IN MG ELEMENTAL CALCIUM) 500 MG chewable tablet Chew 1 tablet by mouth daily as needed for indigestion or heartburn.    Historical Provider, MD  lisinopril (PRINIVIL,ZESTRIL) 20 MG tablet One by mouth twice a day Patient not taking: Reported on 11/13/2014 07/05/14   Dorena Cookey, MD  potassium chloride SA (K-DUR,KLOR-CON) 20 MEQ tablet Take 1 tablet (20 mEq total) by mouth daily. 07/09/14   Dorena Cookey, MD     History reviewed. No pertinent family history.  History   Social History  . Marital Status: Married    Spouse Name: N/A  . Number of Children: N/A  . Years of Education: N/A   Social History Main Topics  . Smoking status: Former Research scientist (life sciences)  . Smokeless tobacco: Former Systems developer    Quit date: 10/27/1983  . Alcohol Use: No  . Drug Use: No  . Sexual Activity: Not on file   Other Topics Concern  . None   Social History Narrative    Review of Systems: A 12 point ROS discussed and pertinent positives are indicated in the HPI above.  All other systems are negative.  Review of Systems  Vital Signs: BP 138/70 mmHg  Pulse 78  Temp(Src) 98 F (36.7  C) (Oral)  Resp 18  Ht 5' 3.5" (1.613 m)  Wt 170 lb (77.111 kg)  BMI 29.64 kg/m2  SpO2 98%  Physical Exam  Constitutional: She is oriented to person, place, and time. No distress.  HENT:  Head: Normocephalic and atraumatic.  Neck: No tracheal deviation present.  Cardiovascular: Normal rate, regular rhythm and intact distal pulses.  Exam reveals no gallop and no friction rub.   No murmur heard. Pulmonary/Chest: Effort normal and breath sounds normal. No respiratory distress. She has no wheezes. She has no rales.  Abdominal: Soft. Bowel sounds are normal. She exhibits no distension. There is no tenderness.  Neurological: She is alert and oriented to person, place, and time.  Skin: Skin is warm. She is not diaphoretic.    Mallampati Score:  MD Evaluation Airway: WNL Heart: WNL Abdomen: WNL Chest/ Lungs: WNL ASA  Classification: 2 Mallampati/Airway Score: Two  Imaging: No results found.  Labs:  CBC:  Recent Labs  06/14/14 1201 10/15/14 0930 11/19/14 0746  WBC 5.9 8.9 6.7  HGB 13.8 12.5 13.2  HCT 40.2 35.3* 37.0  PLT 233.0 239 211    COAGS:  Recent Labs  06/14/14 1201 10/15/14 0930 11/19/14 0746  INR 0.9 0.99 0.98  APTT  --   --  27    BMP:  Recent Labs  06/14/14 1201 07/06/14 1021 10/15/14 0930 11/19/14 0748  NA 144 140 135 137  K 3.8 3.3* 4.7 4.0  CL 104 105 103 105  CO2 31 31 21 20   GLUCOSE 83 61* 196* 186*  BUN 21 16 30* 16  CALCIUM 11.5* 10.2 11.0* 10.0  CREATININE 1.6* 1.3* 1.72* 1.07  GFRNONAA  --   --  28* 59*  GFRAA  --   --  32* 57*   Assessment and Plan: Uncontrolled HTN History of known FMD s/p right renal angioplasty in 1981 Scheduled for renal arteriogram with possible angioplasty/stenting with moderate sedation The patient has been NPO, no blood thinners taken, labs and vitals have been reviewed, pre-medication for contrast allergy taken. Risks and Benefits discussed with the patient including, but not limited to bleeding,  infection or vascular injury. All of the patient's questions were answered, patient is agreeable to proceed. Consent signed and in chart.   SignedHedy Jacob 11/19/2014, 8:20 AM

## 2014-11-19 NOTE — Procedures (Signed)
Procedure:  Bilateral renal arteriography; left renal angioplasty Access:  Right CFA Findings:  Right renal angio normal without evidence of RAS or FMD. Left renal angio shows main renal artery and subsegmental branch vessel FMD.  Dilated with 5 mm and 4 mm balloons with improved angiographic result. No complications. EBL<17mL 4-6 hour recovery

## 2014-11-19 NOTE — Sedation Documentation (Signed)
IR tech holding pressure to R groin 

## 2014-11-19 NOTE — Sedation Documentation (Signed)
MD talking with family about procedure

## 2014-11-20 ENCOUNTER — Other Ambulatory Visit (HOSPITAL_COMMUNITY): Payer: Self-pay | Admitting: Nephrology

## 2014-11-20 DIAGNOSIS — I701 Atherosclerosis of renal artery: Secondary | ICD-10-CM

## 2014-11-22 DIAGNOSIS — R739 Hyperglycemia, unspecified: Secondary | ICD-10-CM | POA: Diagnosis not present

## 2014-11-22 DIAGNOSIS — I10 Essential (primary) hypertension: Secondary | ICD-10-CM | POA: Diagnosis not present

## 2014-11-22 DIAGNOSIS — H3532 Exudative age-related macular degeneration: Secondary | ICD-10-CM | POA: Diagnosis not present

## 2014-11-22 DIAGNOSIS — N183 Chronic kidney disease, stage 3 (moderate): Secondary | ICD-10-CM | POA: Diagnosis not present

## 2014-11-29 DIAGNOSIS — I10 Essential (primary) hypertension: Secondary | ICD-10-CM | POA: Diagnosis not present

## 2014-12-13 DIAGNOSIS — I10 Essential (primary) hypertension: Secondary | ICD-10-CM | POA: Diagnosis not present

## 2014-12-18 DIAGNOSIS — M8580 Other specified disorders of bone density and structure, unspecified site: Secondary | ICD-10-CM | POA: Diagnosis not present

## 2014-12-18 DIAGNOSIS — I10 Essential (primary) hypertension: Secondary | ICD-10-CM | POA: Diagnosis not present

## 2014-12-18 DIAGNOSIS — M810 Age-related osteoporosis without current pathological fracture: Secondary | ICD-10-CM | POA: Diagnosis not present

## 2014-12-18 DIAGNOSIS — E559 Vitamin D deficiency, unspecified: Secondary | ICD-10-CM | POA: Diagnosis not present

## 2014-12-25 DIAGNOSIS — N183 Chronic kidney disease, stage 3 (moderate): Secondary | ICD-10-CM | POA: Diagnosis not present

## 2014-12-25 DIAGNOSIS — R739 Hyperglycemia, unspecified: Secondary | ICD-10-CM | POA: Diagnosis not present

## 2014-12-25 DIAGNOSIS — M8580 Other specified disorders of bone density and structure, unspecified site: Secondary | ICD-10-CM | POA: Diagnosis not present

## 2014-12-25 DIAGNOSIS — I10 Essential (primary) hypertension: Secondary | ICD-10-CM | POA: Diagnosis not present

## 2015-01-01 DIAGNOSIS — I10 Essential (primary) hypertension: Secondary | ICD-10-CM | POA: Diagnosis not present

## 2015-01-21 ENCOUNTER — Encounter (INDEPENDENT_AMBULATORY_CARE_PROVIDER_SITE_OTHER): Payer: Medicare Other | Admitting: Ophthalmology

## 2015-01-21 DIAGNOSIS — H3531 Nonexudative age-related macular degeneration: Secondary | ICD-10-CM | POA: Diagnosis not present

## 2015-01-21 DIAGNOSIS — H35033 Hypertensive retinopathy, bilateral: Secondary | ICD-10-CM

## 2015-01-21 DIAGNOSIS — H3532 Exudative age-related macular degeneration: Secondary | ICD-10-CM | POA: Diagnosis not present

## 2015-01-21 DIAGNOSIS — I10 Essential (primary) hypertension: Secondary | ICD-10-CM | POA: Diagnosis not present

## 2015-01-21 DIAGNOSIS — H43813 Vitreous degeneration, bilateral: Secondary | ICD-10-CM

## 2015-03-19 DIAGNOSIS — R739 Hyperglycemia, unspecified: Secondary | ICD-10-CM | POA: Diagnosis not present

## 2015-03-19 DIAGNOSIS — I10 Essential (primary) hypertension: Secondary | ICD-10-CM | POA: Diagnosis not present

## 2015-03-26 DIAGNOSIS — R739 Hyperglycemia, unspecified: Secondary | ICD-10-CM | POA: Diagnosis not present

## 2015-03-26 DIAGNOSIS — I10 Essential (primary) hypertension: Secondary | ICD-10-CM | POA: Diagnosis not present

## 2015-03-26 DIAGNOSIS — M8580 Other specified disorders of bone density and structure, unspecified site: Secondary | ICD-10-CM | POA: Diagnosis not present

## 2015-04-04 DIAGNOSIS — H3532 Exudative age-related macular degeneration: Secondary | ICD-10-CM | POA: Diagnosis not present

## 2015-04-04 DIAGNOSIS — R739 Hyperglycemia, unspecified: Secondary | ICD-10-CM | POA: Diagnosis not present

## 2015-04-04 DIAGNOSIS — I10 Essential (primary) hypertension: Secondary | ICD-10-CM | POA: Diagnosis not present

## 2015-04-04 DIAGNOSIS — N183 Chronic kidney disease, stage 3 (moderate): Secondary | ICD-10-CM | POA: Diagnosis not present

## 2015-04-22 ENCOUNTER — Encounter (INDEPENDENT_AMBULATORY_CARE_PROVIDER_SITE_OTHER): Payer: Medicare Other | Admitting: Ophthalmology

## 2015-04-22 DIAGNOSIS — H35033 Hypertensive retinopathy, bilateral: Secondary | ICD-10-CM

## 2015-04-22 DIAGNOSIS — H43813 Vitreous degeneration, bilateral: Secondary | ICD-10-CM | POA: Diagnosis not present

## 2015-04-22 DIAGNOSIS — H3532 Exudative age-related macular degeneration: Secondary | ICD-10-CM

## 2015-04-22 DIAGNOSIS — I10 Essential (primary) hypertension: Secondary | ICD-10-CM | POA: Diagnosis not present

## 2015-04-22 DIAGNOSIS — H3531 Nonexudative age-related macular degeneration: Secondary | ICD-10-CM | POA: Diagnosis not present

## 2015-04-25 DIAGNOSIS — I10 Essential (primary) hypertension: Secondary | ICD-10-CM | POA: Diagnosis not present

## 2015-05-16 ENCOUNTER — Other Ambulatory Visit: Payer: Self-pay

## 2015-05-16 DIAGNOSIS — Z1231 Encounter for screening mammogram for malignant neoplasm of breast: Secondary | ICD-10-CM

## 2015-05-28 ENCOUNTER — Ambulatory Visit
Admission: RE | Admit: 2015-05-28 | Discharge: 2015-05-28 | Disposition: A | Discharge status: Home / Self Care | Payer: Medicare Other | Source: Ambulatory Visit

## 2015-05-28 DIAGNOSIS — Z1231 Encounter for screening mammogram for malignant neoplasm of breast: Secondary | ICD-10-CM | POA: Diagnosis not present

## 2015-05-29 DIAGNOSIS — Z23 Encounter for immunization: Secondary | ICD-10-CM | POA: Diagnosis not present

## 2015-06-24 DIAGNOSIS — L720 Epidermal cyst: Secondary | ICD-10-CM | POA: Diagnosis not present

## 2015-06-24 DIAGNOSIS — L570 Actinic keratosis: Secondary | ICD-10-CM | POA: Diagnosis not present

## 2015-06-24 DIAGNOSIS — Z85828 Personal history of other malignant neoplasm of skin: Secondary | ICD-10-CM | POA: Diagnosis not present

## 2015-06-24 DIAGNOSIS — L821 Other seborrheic keratosis: Secondary | ICD-10-CM | POA: Diagnosis not present

## 2015-08-19 ENCOUNTER — Encounter (INDEPENDENT_AMBULATORY_CARE_PROVIDER_SITE_OTHER): Payer: Medicare Other | Admitting: Ophthalmology

## 2015-08-19 DIAGNOSIS — H353124 Nonexudative age-related macular degeneration, left eye, advanced atrophic with subfoveal involvement: Secondary | ICD-10-CM | POA: Diagnosis not present

## 2015-08-19 DIAGNOSIS — H43813 Vitreous degeneration, bilateral: Secondary | ICD-10-CM

## 2015-08-19 DIAGNOSIS — I10 Essential (primary) hypertension: Secondary | ICD-10-CM

## 2015-08-19 DIAGNOSIS — H353211 Exudative age-related macular degeneration, right eye, with active choroidal neovascularization: Secondary | ICD-10-CM

## 2015-08-19 DIAGNOSIS — H35033 Hypertensive retinopathy, bilateral: Secondary | ICD-10-CM | POA: Diagnosis not present

## 2015-09-06 DIAGNOSIS — N183 Chronic kidney disease, stage 3 (moderate): Secondary | ICD-10-CM | POA: Diagnosis not present

## 2015-09-06 DIAGNOSIS — H353290 Exudative age-related macular degeneration, unspecified eye, stage unspecified: Secondary | ICD-10-CM | POA: Diagnosis not present

## 2015-09-06 DIAGNOSIS — R739 Hyperglycemia, unspecified: Secondary | ICD-10-CM | POA: Diagnosis not present

## 2015-09-06 DIAGNOSIS — I7789 Other specified disorders of arteries and arterioles: Secondary | ICD-10-CM | POA: Diagnosis not present

## 2015-09-06 DIAGNOSIS — I10 Essential (primary) hypertension: Secondary | ICD-10-CM | POA: Diagnosis not present

## 2015-09-18 DIAGNOSIS — I10 Essential (primary) hypertension: Secondary | ICD-10-CM | POA: Diagnosis not present

## 2015-09-18 DIAGNOSIS — R739 Hyperglycemia, unspecified: Secondary | ICD-10-CM | POA: Diagnosis not present

## 2015-09-24 DIAGNOSIS — M791 Myalgia: Secondary | ICD-10-CM | POA: Diagnosis not present

## 2015-09-24 DIAGNOSIS — I10 Essential (primary) hypertension: Secondary | ICD-10-CM | POA: Diagnosis not present

## 2015-10-02 DIAGNOSIS — I739 Peripheral vascular disease, unspecified: Secondary | ICD-10-CM | POA: Diagnosis not present

## 2015-12-16 ENCOUNTER — Encounter (INDEPENDENT_AMBULATORY_CARE_PROVIDER_SITE_OTHER): Payer: Medicare Other | Admitting: Ophthalmology

## 2015-12-16 DIAGNOSIS — H353124 Nonexudative age-related macular degeneration, left eye, advanced atrophic with subfoveal involvement: Secondary | ICD-10-CM | POA: Diagnosis not present

## 2015-12-16 DIAGNOSIS — H35033 Hypertensive retinopathy, bilateral: Secondary | ICD-10-CM | POA: Diagnosis not present

## 2015-12-16 DIAGNOSIS — I10 Essential (primary) hypertension: Secondary | ICD-10-CM

## 2015-12-16 DIAGNOSIS — H43813 Vitreous degeneration, bilateral: Secondary | ICD-10-CM

## 2015-12-16 DIAGNOSIS — H353211 Exudative age-related macular degeneration, right eye, with active choroidal neovascularization: Secondary | ICD-10-CM | POA: Diagnosis not present

## 2016-03-17 DIAGNOSIS — I10 Essential (primary) hypertension: Secondary | ICD-10-CM | POA: Diagnosis not present

## 2016-03-24 DIAGNOSIS — Z23 Encounter for immunization: Secondary | ICD-10-CM | POA: Diagnosis not present

## 2016-03-24 DIAGNOSIS — M8580 Other specified disorders of bone density and structure, unspecified site: Secondary | ICD-10-CM | POA: Diagnosis not present

## 2016-03-24 DIAGNOSIS — I773 Arterial fibromuscular dysplasia: Secondary | ICD-10-CM | POA: Diagnosis not present

## 2016-03-24 DIAGNOSIS — R739 Hyperglycemia, unspecified: Secondary | ICD-10-CM | POA: Diagnosis not present

## 2016-03-24 DIAGNOSIS — I10 Essential (primary) hypertension: Secondary | ICD-10-CM | POA: Diagnosis not present

## 2016-03-25 DIAGNOSIS — N183 Chronic kidney disease, stage 3 (moderate): Secondary | ICD-10-CM | POA: Diagnosis not present

## 2016-03-25 DIAGNOSIS — E875 Hyperkalemia: Secondary | ICD-10-CM | POA: Diagnosis not present

## 2016-03-25 DIAGNOSIS — E669 Obesity, unspecified: Secondary | ICD-10-CM | POA: Diagnosis not present

## 2016-03-25 DIAGNOSIS — R739 Hyperglycemia, unspecified: Secondary | ICD-10-CM | POA: Diagnosis not present

## 2016-03-25 DIAGNOSIS — I10 Essential (primary) hypertension: Secondary | ICD-10-CM | POA: Diagnosis not present

## 2016-03-25 DIAGNOSIS — I7789 Other specified disorders of arteries and arterioles: Secondary | ICD-10-CM | POA: Diagnosis not present

## 2016-03-25 DIAGNOSIS — H353290 Exudative age-related macular degeneration, unspecified eye, stage unspecified: Secondary | ICD-10-CM | POA: Diagnosis not present

## 2016-04-01 DIAGNOSIS — I10 Essential (primary) hypertension: Secondary | ICD-10-CM | POA: Diagnosis not present

## 2016-04-01 DIAGNOSIS — N183 Chronic kidney disease, stage 3 (moderate): Secondary | ICD-10-CM | POA: Diagnosis not present

## 2016-04-20 DIAGNOSIS — I10 Essential (primary) hypertension: Secondary | ICD-10-CM | POA: Diagnosis not present

## 2016-04-23 DIAGNOSIS — Z0000 Encounter for general adult medical examination without abnormal findings: Secondary | ICD-10-CM | POA: Diagnosis not present

## 2016-04-23 DIAGNOSIS — R739 Hyperglycemia, unspecified: Secondary | ICD-10-CM | POA: Diagnosis not present

## 2016-04-23 DIAGNOSIS — M8580 Other specified disorders of bone density and structure, unspecified site: Secondary | ICD-10-CM | POA: Diagnosis not present

## 2016-04-23 DIAGNOSIS — N183 Chronic kidney disease, stage 3 (moderate): Secondary | ICD-10-CM | POA: Diagnosis not present

## 2016-04-23 DIAGNOSIS — I10 Essential (primary) hypertension: Secondary | ICD-10-CM | POA: Diagnosis not present

## 2016-04-27 ENCOUNTER — Encounter (INDEPENDENT_AMBULATORY_CARE_PROVIDER_SITE_OTHER): Payer: Medicare Other | Admitting: Ophthalmology

## 2016-04-27 DIAGNOSIS — H35033 Hypertensive retinopathy, bilateral: Secondary | ICD-10-CM | POA: Diagnosis not present

## 2016-04-27 DIAGNOSIS — H353211 Exudative age-related macular degeneration, right eye, with active choroidal neovascularization: Secondary | ICD-10-CM | POA: Diagnosis not present

## 2016-04-27 DIAGNOSIS — H353124 Nonexudative age-related macular degeneration, left eye, advanced atrophic with subfoveal involvement: Secondary | ICD-10-CM | POA: Diagnosis not present

## 2016-04-27 DIAGNOSIS — I10 Essential (primary) hypertension: Secondary | ICD-10-CM | POA: Diagnosis not present

## 2016-04-27 DIAGNOSIS — H43813 Vitreous degeneration, bilateral: Secondary | ICD-10-CM | POA: Diagnosis not present

## 2016-05-07 DIAGNOSIS — R739 Hyperglycemia, unspecified: Secondary | ICD-10-CM | POA: Diagnosis not present

## 2016-05-07 DIAGNOSIS — H353290 Exudative age-related macular degeneration, unspecified eye, stage unspecified: Secondary | ICD-10-CM | POA: Diagnosis not present

## 2016-05-07 DIAGNOSIS — N183 Chronic kidney disease, stage 3 (moderate): Secondary | ICD-10-CM | POA: Diagnosis not present

## 2016-05-07 DIAGNOSIS — E875 Hyperkalemia: Secondary | ICD-10-CM | POA: Diagnosis not present

## 2016-05-07 DIAGNOSIS — I10 Essential (primary) hypertension: Secondary | ICD-10-CM | POA: Diagnosis not present

## 2016-05-07 DIAGNOSIS — I7789 Other specified disorders of arteries and arterioles: Secondary | ICD-10-CM | POA: Diagnosis not present

## 2016-05-07 DIAGNOSIS — E669 Obesity, unspecified: Secondary | ICD-10-CM | POA: Diagnosis not present

## 2016-07-31 DIAGNOSIS — N183 Chronic kidney disease, stage 3 (moderate): Secondary | ICD-10-CM | POA: Diagnosis not present

## 2016-08-04 DIAGNOSIS — H353290 Exudative age-related macular degeneration, unspecified eye, stage unspecified: Secondary | ICD-10-CM | POA: Diagnosis not present

## 2016-08-04 DIAGNOSIS — I10 Essential (primary) hypertension: Secondary | ICD-10-CM | POA: Diagnosis not present

## 2016-08-04 DIAGNOSIS — N183 Chronic kidney disease, stage 3 (moderate): Secondary | ICD-10-CM | POA: Diagnosis not present

## 2016-08-04 DIAGNOSIS — R739 Hyperglycemia, unspecified: Secondary | ICD-10-CM | POA: Diagnosis not present

## 2016-08-04 DIAGNOSIS — E669 Obesity, unspecified: Secondary | ICD-10-CM | POA: Diagnosis not present

## 2016-08-04 DIAGNOSIS — E875 Hyperkalemia: Secondary | ICD-10-CM | POA: Diagnosis not present

## 2016-08-04 DIAGNOSIS — I7789 Other specified disorders of arteries and arterioles: Secondary | ICD-10-CM | POA: Diagnosis not present

## 2016-08-11 ENCOUNTER — Other Ambulatory Visit: Payer: Self-pay | Admitting: Internal Medicine

## 2016-08-11 DIAGNOSIS — Z1231 Encounter for screening mammogram for malignant neoplasm of breast: Secondary | ICD-10-CM

## 2016-08-21 ENCOUNTER — Ambulatory Visit
Admission: RE | Admit: 2016-08-21 | Discharge: 2016-08-21 | Disposition: A | Discharge status: Home / Self Care | Payer: Medicare Other | Source: Ambulatory Visit | Attending: Internal Medicine | Admitting: Internal Medicine

## 2016-08-21 DIAGNOSIS — Z1231 Encounter for screening mammogram for malignant neoplasm of breast: Secondary | ICD-10-CM | POA: Diagnosis not present

## 2016-09-07 ENCOUNTER — Encounter (INDEPENDENT_AMBULATORY_CARE_PROVIDER_SITE_OTHER): Payer: Medicare Other | Admitting: Ophthalmology

## 2016-09-07 DIAGNOSIS — H35033 Hypertensive retinopathy, bilateral: Secondary | ICD-10-CM | POA: Diagnosis not present

## 2016-09-07 DIAGNOSIS — I10 Essential (primary) hypertension: Secondary | ICD-10-CM

## 2016-09-07 DIAGNOSIS — H353124 Nonexudative age-related macular degeneration, left eye, advanced atrophic with subfoveal involvement: Secondary | ICD-10-CM | POA: Diagnosis not present

## 2016-09-07 DIAGNOSIS — H353211 Exudative age-related macular degeneration, right eye, with active choroidal neovascularization: Secondary | ICD-10-CM | POA: Diagnosis not present

## 2016-09-07 DIAGNOSIS — H43813 Vitreous degeneration, bilateral: Secondary | ICD-10-CM | POA: Diagnosis not present

## 2016-09-28 DIAGNOSIS — N390 Urinary tract infection, site not specified: Secondary | ICD-10-CM | POA: Diagnosis not present

## 2016-09-28 DIAGNOSIS — N183 Chronic kidney disease, stage 3 (moderate): Secondary | ICD-10-CM | POA: Diagnosis not present

## 2016-10-21 DIAGNOSIS — M8580 Other specified disorders of bone density and structure, unspecified site: Secondary | ICD-10-CM | POA: Diagnosis not present

## 2016-10-21 DIAGNOSIS — I10 Essential (primary) hypertension: Secondary | ICD-10-CM | POA: Diagnosis not present

## 2016-11-02 DIAGNOSIS — M8580 Other specified disorders of bone density and structure, unspecified site: Secondary | ICD-10-CM | POA: Diagnosis not present

## 2016-11-02 DIAGNOSIS — N183 Chronic kidney disease, stage 3 (moderate): Secondary | ICD-10-CM | POA: Diagnosis not present

## 2016-11-02 DIAGNOSIS — I10 Essential (primary) hypertension: Secondary | ICD-10-CM | POA: Diagnosis not present

## 2016-11-02 DIAGNOSIS — Z0001 Encounter for general adult medical examination with abnormal findings: Secondary | ICD-10-CM | POA: Diagnosis not present

## 2016-11-02 DIAGNOSIS — R739 Hyperglycemia, unspecified: Secondary | ICD-10-CM | POA: Diagnosis not present

## 2016-12-14 DIAGNOSIS — N183 Chronic kidney disease, stage 3 (moderate): Secondary | ICD-10-CM | POA: Diagnosis not present

## 2016-12-17 DIAGNOSIS — H353290 Exudative age-related macular degeneration, unspecified eye, stage unspecified: Secondary | ICD-10-CM | POA: Diagnosis not present

## 2016-12-17 DIAGNOSIS — E875 Hyperkalemia: Secondary | ICD-10-CM | POA: Diagnosis not present

## 2016-12-17 DIAGNOSIS — I7789 Other specified disorders of arteries and arterioles: Secondary | ICD-10-CM | POA: Diagnosis not present

## 2016-12-17 DIAGNOSIS — N183 Chronic kidney disease, stage 3 (moderate): Secondary | ICD-10-CM | POA: Diagnosis not present

## 2016-12-17 DIAGNOSIS — E669 Obesity, unspecified: Secondary | ICD-10-CM | POA: Diagnosis not present

## 2016-12-17 DIAGNOSIS — I10 Essential (primary) hypertension: Secondary | ICD-10-CM | POA: Diagnosis not present

## 2016-12-17 DIAGNOSIS — R739 Hyperglycemia, unspecified: Secondary | ICD-10-CM | POA: Diagnosis not present

## 2016-12-23 ENCOUNTER — Other Ambulatory Visit: Payer: Self-pay | Admitting: Nephrology

## 2016-12-23 DIAGNOSIS — I10 Essential (primary) hypertension: Secondary | ICD-10-CM

## 2016-12-23 DIAGNOSIS — I773 Arterial fibromuscular dysplasia: Secondary | ICD-10-CM

## 2016-12-31 ENCOUNTER — Other Ambulatory Visit: Payer: Medicare Other

## 2017-02-01 ENCOUNTER — Encounter (INDEPENDENT_AMBULATORY_CARE_PROVIDER_SITE_OTHER): Payer: Medicare Other | Admitting: Ophthalmology

## 2017-02-01 DIAGNOSIS — H43813 Vitreous degeneration, bilateral: Secondary | ICD-10-CM | POA: Diagnosis not present

## 2017-02-01 DIAGNOSIS — H35033 Hypertensive retinopathy, bilateral: Secondary | ICD-10-CM | POA: Diagnosis not present

## 2017-02-01 DIAGNOSIS — H353124 Nonexudative age-related macular degeneration, left eye, advanced atrophic with subfoveal involvement: Secondary | ICD-10-CM

## 2017-02-01 DIAGNOSIS — I10 Essential (primary) hypertension: Secondary | ICD-10-CM

## 2017-02-01 DIAGNOSIS — H26492 Other secondary cataract, left eye: Secondary | ICD-10-CM

## 2017-02-01 DIAGNOSIS — H353211 Exudative age-related macular degeneration, right eye, with active choroidal neovascularization: Secondary | ICD-10-CM

## 2017-02-03 ENCOUNTER — Ambulatory Visit
Admission: RE | Admit: 2017-02-03 | Discharge: 2017-02-03 | Disposition: A | Discharge status: Home / Self Care | Payer: Medicare Other | Source: Ambulatory Visit | Attending: Nephrology | Admitting: Nephrology

## 2017-02-03 DIAGNOSIS — I10 Essential (primary) hypertension: Secondary | ICD-10-CM

## 2017-02-03 DIAGNOSIS — I773 Arterial fibromuscular dysplasia: Secondary | ICD-10-CM

## 2017-02-23 DIAGNOSIS — N183 Chronic kidney disease, stage 3 (moderate): Secondary | ICD-10-CM | POA: Diagnosis not present

## 2017-04-01 DIAGNOSIS — N183 Chronic kidney disease, stage 3 (moderate): Secondary | ICD-10-CM | POA: Diagnosis not present

## 2017-04-08 DIAGNOSIS — H353290 Exudative age-related macular degeneration, unspecified eye, stage unspecified: Secondary | ICD-10-CM | POA: Diagnosis not present

## 2017-04-08 DIAGNOSIS — E669 Obesity, unspecified: Secondary | ICD-10-CM | POA: Diagnosis not present

## 2017-04-08 DIAGNOSIS — I10 Essential (primary) hypertension: Secondary | ICD-10-CM | POA: Diagnosis not present

## 2017-04-08 DIAGNOSIS — I7789 Other specified disorders of arteries and arterioles: Secondary | ICD-10-CM | POA: Diagnosis not present

## 2017-04-08 DIAGNOSIS — N183 Chronic kidney disease, stage 3 (moderate): Secondary | ICD-10-CM | POA: Diagnosis not present

## 2017-04-08 DIAGNOSIS — E875 Hyperkalemia: Secondary | ICD-10-CM | POA: Diagnosis not present

## 2017-04-08 DIAGNOSIS — R739 Hyperglycemia, unspecified: Secondary | ICD-10-CM | POA: Diagnosis not present

## 2017-04-13 ENCOUNTER — Other Ambulatory Visit: Payer: Self-pay | Admitting: Nephrology

## 2017-04-13 DIAGNOSIS — I773 Arterial fibromuscular dysplasia: Secondary | ICD-10-CM

## 2017-04-16 ENCOUNTER — Encounter: Payer: Self-pay | Admitting: Family Medicine

## 2017-04-21 ENCOUNTER — Other Ambulatory Visit (HOSPITAL_COMMUNITY): Payer: Self-pay | Admitting: Interventional Radiology

## 2017-04-21 ENCOUNTER — Ambulatory Visit
Admission: RE | Admit: 2017-04-21 | Discharge: 2017-04-21 | Disposition: A | Discharge status: Home / Self Care | Payer: Medicare Other | Source: Ambulatory Visit | Attending: Nephrology | Admitting: Nephrology

## 2017-04-21 DIAGNOSIS — I773 Arterial fibromuscular dysplasia: Secondary | ICD-10-CM

## 2017-04-21 HISTORY — PX: IR RADIOLOGIST EVAL & MGMT: IMG5224

## 2017-04-21 NOTE — Progress Notes (Signed)
Chief Complaint: Patient was seen in today for renal fibromuscular dysplasia at the request of Coladonato,Joseph  Referring Physician(s): Coladonato,Joseph  History of Present Illness: Michelle [name] is a 76 y.o. female well known to me from prior renal arteriography and left renal artery angioplasty to treat fibromuscular dysplasia on 11/19/2014. She is also status post prior right renal artery angioplasty in 1981 for FMD.  Recently she has had an increase in serum creatinine as well as hypertension. A renal duplex ultrasound on 02/03/2017 demonstrated increased velocities in the left renal artery suggestive of stenosis. Creatinine on 04/01/2017 was 1.37 with estimated GFR of 37 mL per minute.  Past Medical History:  Diagnosis Date  . Allergic rhinitis   . Cystocele   . Fibromuscular dysplasia of renal artery ([place])   . Hypertension    fibromuscular dysplasia  . Osteoarthritis   . Osteoporosis   . PMS (premenstrual syndrome)   . Retinal hemorrhage, right    with macular degeneration    Past Surgical History:  Procedure Laterality Date  . ANGIOPLASTY     right renal 1981  . BTL    . cb x 3    . HEMORRHOID SURGERY    . Rt ovarian cyst removed    . TONSILLECTOMY AND ADENOIDECTOMY      Allergies: Iodinated diagnostic agents and Diphenhydramine hcl  Medications: Prior to Admission medications   Medication Sig Start Date End Date Taking? Authorizing Provider  aspirin 81 MG tablet Take 81 mg by mouth daily.   Yes [provider]  beta carotene w/minerals (OCUVITE) tablet Take 1 tablet by mouth daily.   Yes [provider]  Bevacizumab (AVASTIN) 100 MG/4ML SOLN Inject into the eye. For eyes One every 12 weeks 1/10 mg   Yes [provider]  calcium carbonate (TUMS - DOSED IN MG ELEMENTAL CALCIUM) 500 MG chewable tablet Chew 1 tablet by mouth daily as needed for indigestion or heartburn.   Yes [provider]  cetirizine (ZYRTEC) 10 MG  tablet Take 10 mg by mouth as needed.     Yes [provider]  furosemide (LASIX) 20 MG tablet Take 10 mg by mouth daily.   Yes [provider]  Omega-3 Fatty Acids (FISH OIL) 500 MG CAPS Take by mouth.   Yes [provider]     No family history on file.  Social History   Social History  . Marital status: Married    Spouse name: N/A  . Number of children: N/A  . Years of education: N/A   Social History Main Topics  . Smoking status: Former Research scientist (life sciences)  . Smokeless tobacco: Former Systems developer    Quit date: 10/27/1983  . Alcohol use No  . Drug use: No  . Sexual activity: Not Asked   Other Topics Concern  . None   Social History Narrative  . None     Review of Systems: A 12 point ROS discussed and pertinent positives are indicated in the HPI above.  All other systems are negative.  Review of Systems  Constitutional: Negative.   HENT: Negative.   Respiratory: Negative.   Cardiovascular: Negative.   Gastrointestinal: Negative.   Genitourinary: Negative.   Musculoskeletal: Negative.   Neurological: Negative.     Vital Signs: BP (!) 133/50 (BP Location: Left Arm, Patient Position: Sitting, Cuff Size: Normal)   Pulse 61   Temp 97.9 F (36.6 C)   Resp 16   SpO2 98%   Physical Exam  Constitutional: She is oriented to person, place, and time. She appears well-developed and well-nourished. No distress.  Neck: Neck supple. No JVD present.  Cardiovascular: Normal rate, regular rhythm and normal heart sounds.  Exam reveals no gallop and no friction rub.   No murmur heard. Pulmonary/Chest: Effort normal and breath sounds normal. No stridor. No respiratory distress. She has no wheezes. She has no rales.  Abdominal: Soft. Bowel sounds are normal. She exhibits no distension and no mass. There is no tenderness. There is no rebound and no guarding.  No audible abdominal bruits.  Musculoskeletal: She exhibits no edema.  Neurological: She is alert and oriented to  person, place, and time.  Skin: She is not diaphoretic.  Vitals reviewed.   Imaging: See above.  Labs:  BMP: See above.   Assessment and Plan:  With recent increase in serum creatinine up to 1.5, increase in blood pressure and renal artery duplex demonstrating elevated left renal artery velocities, there is moderate suspicion of recurrent hemodynamically significant fibromuscular dysplasia. Renal arteriography with possible balloon angioplasty would be indicated. We discussed another arteriogram with possible angioplasty. [name] is agreeable to proceeding and would like to have the procedure done sometime in October. I will have [facility] call her to set up the procedure.  She will have to be premedicated for iodinated contrast allergy prior to angiography. In the setting of FMD, some diluted contrast will have to be given in order to accurately delineate degree of fibromuscular dysplasia as well as branch vessel involvement given that prior angioplasty was performed in a left renal segmental branch. CO2 is not adequate for branch vessel assessment. She would therefore benefit from some pre-procedural and post-procedural hydration given her chronic kidney disease, which I will coordinate. I told her to stay on aspirin prior to and following the procedure.    Electronically SignedAletta Edouard T 04/21/2017, 9:23 AM   I spent a total of  25 Minutes in face to face in clinical consultation, greater than 50% of which was counseling/coordinating care for fibromuscular dysplasia of the renal arteries.

## 2017-04-27 DIAGNOSIS — M859 Disorder of bone density and structure, unspecified: Secondary | ICD-10-CM | POA: Diagnosis not present

## 2017-04-27 DIAGNOSIS — M8580 Other specified disorders of bone density and structure, unspecified site: Secondary | ICD-10-CM | POA: Diagnosis not present

## 2017-04-27 DIAGNOSIS — R739 Hyperglycemia, unspecified: Secondary | ICD-10-CM | POA: Diagnosis not present

## 2017-04-27 DIAGNOSIS — E559 Vitamin D deficiency, unspecified: Secondary | ICD-10-CM | POA: Diagnosis not present

## 2017-04-27 DIAGNOSIS — I10 Essential (primary) hypertension: Secondary | ICD-10-CM | POA: Diagnosis not present

## 2017-05-04 DIAGNOSIS — N183 Chronic kidney disease, stage 3 (moderate): Secondary | ICD-10-CM | POA: Diagnosis not present

## 2017-05-04 DIAGNOSIS — Z23 Encounter for immunization: Secondary | ICD-10-CM | POA: Diagnosis not present

## 2017-05-04 DIAGNOSIS — E663 Overweight: Secondary | ICD-10-CM | POA: Diagnosis not present

## 2017-05-06 ENCOUNTER — Other Ambulatory Visit: Payer: Self-pay | Admitting: Radiology

## 2017-05-06 ENCOUNTER — Telehealth: Payer: Self-pay | Admitting: Radiology

## 2017-05-06 NOTE — Progress Notes (Signed)
  Scheduled for renal arteriogram 10/16 Allergic to contrast  Pre meds have been called in to pharmacy  Confirmed with pt

## 2017-05-10 ENCOUNTER — Other Ambulatory Visit: Payer: Self-pay | Admitting: Radiology

## 2017-05-10 ENCOUNTER — Encounter: Payer: Self-pay | Admitting: Interventional Radiology

## 2017-05-11 ENCOUNTER — Other Ambulatory Visit (HOSPITAL_COMMUNITY): Payer: Self-pay | Admitting: Interventional Radiology

## 2017-05-11 ENCOUNTER — Encounter (HOSPITAL_COMMUNITY): Payer: Self-pay

## 2017-05-11 ENCOUNTER — Ambulatory Visit (HOSPITAL_COMMUNITY)
Admission: RE | Admit: 2017-05-11 | Discharge: 2017-05-11 | Disposition: A | Discharge status: Home / Self Care | Payer: Medicare Other | Source: Ambulatory Visit | Attending: Interventional Radiology | Admitting: Interventional Radiology

## 2017-05-11 DIAGNOSIS — I773 Arterial fibromuscular dysplasia: Secondary | ICD-10-CM | POA: Diagnosis not present

## 2017-05-11 DIAGNOSIS — N189 Chronic kidney disease, unspecified: Secondary | ICD-10-CM | POA: Diagnosis not present

## 2017-05-11 DIAGNOSIS — M810 Age-related osteoporosis without current pathological fracture: Secondary | ICD-10-CM | POA: Insufficient documentation

## 2017-05-11 DIAGNOSIS — M1990 Unspecified osteoarthritis, unspecified site: Secondary | ICD-10-CM | POA: Insufficient documentation

## 2017-05-11 DIAGNOSIS — I701 Atherosclerosis of renal artery: Secondary | ICD-10-CM | POA: Diagnosis not present

## 2017-05-11 DIAGNOSIS — Z87891 Personal history of nicotine dependence: Secondary | ICD-10-CM | POA: Insufficient documentation

## 2017-05-11 DIAGNOSIS — Z7982 Long term (current) use of aspirin: Secondary | ICD-10-CM | POA: Diagnosis not present

## 2017-05-11 DIAGNOSIS — I129 Hypertensive chronic kidney disease with stage 1 through stage 4 chronic kidney disease, or unspecified chronic kidney disease: Secondary | ICD-10-CM | POA: Insufficient documentation

## 2017-05-11 HISTORY — PX: IR US GUIDE VASC ACCESS RIGHT: IMG2390

## 2017-05-11 HISTORY — PX: IR PTA RENAL VISCERAL: IMG6124

## 2017-05-11 HISTORY — PX: IR RENAL BILAT S&I MOD SED: IMG656

## 2017-05-11 LAB — CBC
HCT: 38.6 % (ref 36.0–46.0)
HEMOGLOBIN: 13.5 g/dL (ref 12.0–15.0)
MCH: 30.7 pg (ref 26.0–34.0)
MCHC: 35 g/dL (ref 30.0–36.0)
MCV: 87.7 fL (ref 78.0–100.0)
PLATELETS: 178 10*3/uL (ref 150–400)
RBC: 4.4 MIL/uL (ref 3.87–5.11)
RDW: 12.4 % (ref 11.5–15.5)
WBC: 6 10*3/uL (ref 4.0–10.5)

## 2017-05-11 LAB — BASIC METABOLIC PANEL
ANION GAP: 9 (ref 5–15)
BUN: 18 mg/dL (ref 6–20)
CHLORIDE: 101 mmol/L (ref 101–111)
CO2: 24 mmol/L (ref 22–32)
Calcium: 10 mg/dL (ref 8.9–10.3)
Creatinine, Ser: 1.27 mg/dL — ABNORMAL HIGH (ref 0.44–1.00)
GFR, EST AFRICAN AMERICAN: 46 mL/min — AB (ref >60)
GFR, EST NON AFRICAN AMERICAN: 39 mL/min — AB (ref >60)
Glucose, Bld: 175 mg/dL — ABNORMAL HIGH (ref 65–99)
POTASSIUM: 4 mmol/L (ref 3.5–5.1)
SODIUM: 134 mmol/L — AB (ref 135–145)

## 2017-05-11 LAB — PROTIME-INR
INR: 0.96
PROTHROMBIN TIME: 12.7 s (ref 11.4–15.2)

## 2017-05-11 LAB — APTT: APTT: 26 s (ref 24–36)

## 2017-05-11 MED ORDER — MIDAZOLAM HCL 2 MG/2ML IJ SOLN
INTRAMUSCULAR | Status: AC
Start: 2017-05-11 — End: 2017-05-11
  Filled 2017-05-11: qty 4

## 2017-05-11 MED ORDER — IOPAMIDOL (ISOVUE-300) INJECTION 61%
INTRAVENOUS | Status: AC
  Administered 2017-05-11: 50 mL
  Filled 2017-05-11: qty 100

## 2017-05-11 MED ORDER — LIDOCAINE HCL 1 % IJ SOLN
INTRAMUSCULAR | Status: AC | PRN
  Administered 2017-05-11: 10 mL

## 2017-05-11 MED ORDER — MIDAZOLAM HCL 2 MG/2ML IJ SOLN
INTRAMUSCULAR | Status: AC | PRN
  Administered 2017-05-11 (×2): 1 mg via INTRAVENOUS

## 2017-05-11 MED ORDER — HEPARIN SODIUM (PORCINE) 1000 UNIT/ML IJ SOLN
INTRAMUSCULAR | Status: AC | PRN
  Administered 2017-05-11: 3000 [IU] via INTRAVENOUS

## 2017-05-11 MED ORDER — IOPAMIDOL (ISOVUE-300) INJECTION 61%
INTRAVENOUS | Status: AC
  Administered 2017-05-11: 25 mL
  Filled 2017-05-11: qty 100

## 2017-05-11 MED ORDER — FENTANYL CITRATE (PF) 100 MCG/2ML IJ SOLN
INTRAMUSCULAR | Status: AC
  Filled 2017-05-11: qty 4

## 2017-05-11 MED ORDER — SODIUM CHLORIDE 0.9 % IV SOLN: INTRAVENOUS | Status: DC

## 2017-05-11 MED ORDER — FENTANYL CITRATE (PF) 100 MCG/2ML IJ SOLN
INTRAMUSCULAR | Status: AC | PRN
  Administered 2017-05-11 (×2): 50 ug via INTRAVENOUS

## 2017-05-11 MED ORDER — LIDOCAINE HCL 1 % IJ SOLN
INTRAMUSCULAR | Status: AC
  Filled 2017-05-11: qty 20

## 2017-05-11 MED ORDER — HEPARIN SODIUM (PORCINE) 1000 UNIT/ML IJ SOLN
INTRAMUSCULAR | Status: AC
  Filled 2017-05-11: qty 1

## 2017-05-11 MED ORDER — SODIUM CHLORIDE 0.9 % IV SOLN
INTRAVENOUS | Status: DC
  Administered 2017-05-11: 08:00:00 via INTRAVENOUS

## 2017-05-11 NOTE — Sedation Documentation (Signed)
Patient is resting comfortably. 

## 2017-05-11 NOTE — Sedation Documentation (Signed)
Proximal Right Renal artery 108/55 (75) Abdominal Aorta 107/54 (73)

## 2017-05-11 NOTE — Procedures (Signed)
Interventional Radiology Procedure Note  Procedure: Bilateral renal arteriography and left renal angioplasty  Complications: None  Estimated Blood Loss: < 10 mL  No significant FMD of right renal artery or branches.  Left mid/distal main RA and segmental FMD treated with 4 mm and 5 mm balloon angioplasty. Improved angiographic result.  Plan: 4 hour recovery with additional IV hydration.  Venetia Night. Kathlene Cote, M.D Pager:  [phone]

## 2017-05-11 NOTE — H&P (Signed)
Chief Complaint: Patient is scheduled today for renal arteriogram  Referring Physician(s): Dr. Markus Jarvis  Supervising Physician: Aletta Edouard  Patient Status: [facility] - Out-pt  History of Present Illness: Michelle [name] is a 76 y.o. female with hx of CKD and fibromuscular dysplasia related renal artery stenosis. She has had prior renal angiogram with balloon angioplasty. Her outpt US duplex found elevated pressures in the left RA and she was again seen by Dr. Kathlene Cote. She is now scheduled today for renal angiogram and possible angioplasty. PMHx, meds, labs, imaging, allergies reviewed. Contrast allergy known. Pre-meds have been prescribed and taken as directed. She is also brought in a few hours early for pre-hydration. Has been NPO this am  Past Medical History:  Diagnosis Date  . Allergic rhinitis   . Cystocele   . Fibromuscular dysplasia of renal artery ([place])   . Hypertension    fibromuscular dysplasia  . Osteoarthritis   . Osteoporosis   . PMS (premenstrual syndrome)   . Retinal hemorrhage, right    with macular degeneration    Past Surgical History:  Procedure Laterality Date  . ANGIOPLASTY     right renal 1981  . BTL    . cb x 3    . HEMORRHOID SURGERY    . IR RADIOLOGIST EVAL & MGMT  04/21/2017  . Rt ovarian cyst removed    . TONSILLECTOMY AND ADENOIDECTOMY      Allergies: Iodinated diagnostic agents and Diphenhydramine hcl  Medications: Prior to Admission medications   Medication Sig Start Date End Date Taking? Authorizing Provider  aspirin EC 81 MG tablet Take 81 mg by mouth daily.   Yes [provider]  beta carotene w/minerals (OCUVITE) tablet Take 1 tablet by mouth daily.   Yes [provider]  cetirizine (ZYRTEC) 10 MG tablet Take 10 mg by mouth daily as needed (for allergies.).    Yes [provider]  furosemide (LASIX) 20 MG tablet Take 10 mg by mouth daily.   Yes [provider]  lisinopril  (PRINIVIL,ZESTRIL) 20 MG tablet Take 20 mg by mouth daily.   Yes [provider]  naproxen sodium (ANAPROX) 220 MG tablet Take 440 mg by mouth at bedtime as needed (for pain.).   Yes [provider]  Omega-3 1000 MG CAPS Take 1,000 mg by mouth daily.   Yes [provider]  omeprazole (PRILOSEC) 20 MG capsule Take 20 mg by mouth daily before breakfast.   Yes [provider]  predniSONE (DELTASONE) 50 MG tablet Take 50 mg by mouth See admin instructions. Take 1 tablet (50 mg) by mouth at 2100, 0300, & 0900 before procedure.   Yes [provider]  Bevacizumab (AVASTIN) 100 MG/4ML SOLN Inject 1-10 mg into the eye See admin instructions. For eyes One every 19 weeks 1/10 mg    [provider]     History reviewed. No pertinent family history.  Social History   Social History  . Marital status: Married    Spouse name: N/A  . Number of children: N/A  . Years of education: N/A   Social History Main Topics  . Smoking status: Former Research scientist (life sciences)  . Smokeless tobacco: Former Systems developer    Quit date: 10/27/1983  . Alcohol use No  . Drug use: No  . Sexual activity: Not Asked   Other Topics Concern  . None   Social History Narrative  . None      Review of Systems: A 12 point ROS discussed  and pertinent positives are indicated in the HPI above.  All other systems are negative.  Review of Systems  Vital Signs: BP (!) 155/80   Pulse 82   Temp 98.2 F (36.8 C) (Oral)   Ht 5\' 4"  (1.626 m)   Wt 170 lb (77.1 kg)   SpO2 99%   BMI 29.18 kg/m   Physical Exam  Constitutional: She is oriented to person, place, and time. She appears well-developed. No distress.  HENT:  Head: Normocephalic.  Mouth/Throat: Oropharynx is clear and moist.  Neck: Normal range of motion. No JVD present. No tracheal deviation present.  Cardiovascular: Normal rate, regular rhythm, normal heart sounds and intact distal pulses.   Pulmonary/Chest: Effort normal and breath  sounds normal. No respiratory distress.  Abdominal: Soft. There is no tenderness.  Neurological: She is alert and oriented to person, place, and time.  Skin: Skin is warm and dry.  Psychiatric: She has a normal mood and affect. Judgment normal.     Labs:  CBC:  Recent Labs  05/11/17 0722  WBC 6.0  HGB 13.5  HCT 38.6  PLT 178    COAGS:  Recent Labs  05/11/17 0722  INR 0.96  APTT 26    BMP:  Recent Labs  05/11/17 0722  NA 134*  K 4.0  CL 101  CO2 24  GLUCOSE 175*  BUN 18  CALCIUM 10.0  CREATININE 1.27*  GFRNONAA 39*  GFRAA 46*    LIVER FUNCTION TESTS: No results for input(s): BILITOT, AST, ALT, ALKPHOS, PROT, ALBUMIN in the last 8760 hours.  TUMOR MARKERS: No results for input(s): AFPTM, CEA, CA199, CHROMGRNA in the last 8760 hours.  Assessment and Plan: Recurrent renal artery stenosis from Fibromuscular dysplasia Plan for renal arteriogram with possible balloon/stent angioplasty. Risks and benefits of renal angiogram were discussed with the patient including, but not limited to bleeding, infection, vascular injury or contrast induced renal failure.  This interventional procedure involves the use of X-rays and because of the nature of the planned procedure, it is possible that we will have prolonged use of X-ray fluoroscopy.  Potential radiation risks to you include (but are not limited to) the following: - A slightly elevated risk for cancer  several years later in life. This risk is typically less than 0.5% percent. This risk is low in comparison to the normal incidence of human cancer, which is 33% for women and 50% for men according to the [place]. - Radiation induced injury can include skin redness, resembling a rash, tissue breakdown / ulcers and hair loss (which can be temporary or permanent).   The likelihood of either of these occurring depends on the difficulty of the procedure and whether you are sensitive to radiation due to  previous procedures, disease, or genetic conditions.   IF your procedure requires a prolonged use of radiation, you will be notified and given written instructions for further action.  It is your responsibility to monitor the irradiated area for the 2 weeks following the procedure and to notify your physician if you are concerned that you have suffered a radiation induced injury.    All of the patient's questions were answered, patient is agreeable to proceed.  Consent signed and in chart.     Thank you for this interesting consult.  I greatly enjoyed meeting Michelle [name] and look forward to participating in their care.  A copy of this report was sent to the requesting provider on this date.  Electronically Signed:  Ascencion Dike, PA-C 05/11/2017, 8:24 AM   I spent a total of 20 minutes in face to face in clinical consultation, greater than 50% of which was counseling/coordinating care for renal angio

## 2017-05-11 NOTE — Discharge Instructions (Signed)
Angiogram, Care After °This sheet gives you information about how to care for yourself after your procedure. Your health care provider may also give you more specific instructions. If you have problems or questions, contact your health care provider. °What can I expect after the procedure? °After the procedure, it is common to have bruising and tenderness at the catheter insertion area. °Follow these instructions at home: °Insertion site care  °· Follow instructions from your health care provider about how to take care of your insertion site. Make sure you: °¨ Wash your hands with soap and water before you change your bandage (dressing). If soap and water are not available, use hand sanitizer. °¨ Change your dressing as told by your health care provider. °¨ Leave stitches (sutures), skin glue, or adhesive strips in place. These skin closures may need to stay in place for 2 weeks or longer. If adhesive strip edges start to loosen and curl up, you may trim the loose edges. Do not remove adhesive strips completely unless your health care provider tells you to do that. °· Do not take baths, swim, or use a hot tub until your health care provider approves. °· You may shower 24-48 hours after the procedure or as told by your health care provider. °¨ Gently wash the site with plain soap and water. °¨ Pat the area dry with a clean towel. °¨ Do not rub the site. This may cause bleeding. °· Do not apply powder or lotion to the site. Keep the site clean and dry. °· Check your insertion site every day for signs of infection. Check for: °¨ Redness, swelling, or pain. °¨ Fluid or blood. °¨ Warmth. °¨ Pus or a bad smell. °Activity  °· Rest as told by your health care provider, usually for 1-2 days. °· Do not lift anything that is heavier than 10 lbs. (4.5 kg) or as told by your health care provider. °· Do not drive for 24 hours if you were given a medicine to help you relax (sedative). °· Do not drive or use heavy machinery while  taking prescription pain medicine. °General instructions  °· Return to your normal activities as told by your health care provider, usually in about a week. Ask your health care provider what activities are safe for you. °· If the catheter site starts bleeding, lie flat and put pressure on the site. If the bleeding does not stop, get help right away. This is a medical emergency. °· Drink enough fluid to keep your urine clear or pale yellow. This helps flush the contrast dye from your body. °· Take over-the-counter and prescription medicines only as told by your health care provider. °· Keep all follow-up visits as told by your health care provider. This is important. °Contact a health care provider if: °· You have a fever or chills. °· You have redness, swelling, or pain around your insertion site. °· You have fluid or blood coming from your insertion site. °· The insertion site feels warm to the touch. °· You have pus or a bad smell coming from your insertion site. °· You have bruising around the insertion site. °· You notice blood collecting in the tissue around the catheter site (hematoma). The hematoma may be painful to the touch. °Get help right away if: °· You have severe pain at the catheter insertion area. °· The catheter insertion area swells very fast. °· The catheter insertion area is bleeding, and the bleeding does not stop when you hold steady pressure on   the area. °· The area near or just beyond the catheter insertion site becomes pale, cool, tingly, or numb. °These symptoms may represent a serious problem that is an emergency. Do not wait to see if the symptoms will go away. Get medical help right away. Call your local emergency services (911 in the [place] Do not drive yourself to the hospital. °Summary °· After the procedure, it is common to have bruising and tenderness at the catheter insertion area. °· After the procedure, it is important to rest and drink plenty of fluids. °· Do not take baths,  swim, or use a hot tub until your health care provider says it is okay to do so. You may shower 24-48 hours after the procedure or as told by your health care provider. °· If the catheter site starts bleeding, lie flat and put pressure on the site. If the bleeding does not stop, get help right away. This is a medical emergency. °This information is not intended to replace advice given to you by your health care provider. Make sure you discuss any questions you have with your health care provider. °Document Released: 01/29/2005 Document Revised: 06/17/2016 Document Reviewed: 06/17/2016 °Elsevier Interactive Patient Education © 2017 [employer] Inc. ° °

## 2017-06-15 DIAGNOSIS — N183 Chronic kidney disease, stage 3 (moderate): Secondary | ICD-10-CM | POA: Diagnosis not present

## 2017-06-28 ENCOUNTER — Encounter (INDEPENDENT_AMBULATORY_CARE_PROVIDER_SITE_OTHER): Payer: Medicare Other | Admitting: Ophthalmology

## 2017-06-28 DIAGNOSIS — H35033 Hypertensive retinopathy, bilateral: Secondary | ICD-10-CM

## 2017-06-28 DIAGNOSIS — H43813 Vitreous degeneration, bilateral: Secondary | ICD-10-CM

## 2017-06-28 DIAGNOSIS — H353124 Nonexudative age-related macular degeneration, left eye, advanced atrophic with subfoveal involvement: Secondary | ICD-10-CM

## 2017-06-28 DIAGNOSIS — H26492 Other secondary cataract, left eye: Secondary | ICD-10-CM | POA: Diagnosis not present

## 2017-06-28 DIAGNOSIS — H353211 Exudative age-related macular degeneration, right eye, with active choroidal neovascularization: Secondary | ICD-10-CM

## 2017-06-28 DIAGNOSIS — I10 Essential (primary) hypertension: Secondary | ICD-10-CM

## 2017-06-29 DIAGNOSIS — R739 Hyperglycemia, unspecified: Secondary | ICD-10-CM | POA: Diagnosis not present

## 2017-06-29 DIAGNOSIS — E875 Hyperkalemia: Secondary | ICD-10-CM | POA: Diagnosis not present

## 2017-06-29 DIAGNOSIS — I10 Essential (primary) hypertension: Secondary | ICD-10-CM | POA: Diagnosis not present

## 2017-06-29 DIAGNOSIS — E669 Obesity, unspecified: Secondary | ICD-10-CM | POA: Diagnosis not present

## 2017-06-29 DIAGNOSIS — N183 Chronic kidney disease, stage 3 (moderate): Secondary | ICD-10-CM | POA: Diagnosis not present

## 2017-06-29 DIAGNOSIS — H353290 Exudative age-related macular degeneration, unspecified eye, stage unspecified: Secondary | ICD-10-CM | POA: Diagnosis not present

## 2017-06-29 DIAGNOSIS — I7789 Other specified disorders of arteries and arterioles: Secondary | ICD-10-CM | POA: Diagnosis not present

## 2017-07-02 ENCOUNTER — Encounter (INDEPENDENT_AMBULATORY_CARE_PROVIDER_SITE_OTHER): Payer: Medicare Other | Admitting: Ophthalmology

## 2017-07-02 DIAGNOSIS — H26492 Other secondary cataract, left eye: Secondary | ICD-10-CM

## 2017-08-17 DIAGNOSIS — N183 Chronic kidney disease, stage 3 (moderate): Secondary | ICD-10-CM | POA: Diagnosis not present

## 2017-10-19 DIAGNOSIS — N183 Chronic kidney disease, stage 3 (moderate): Secondary | ICD-10-CM | POA: Diagnosis not present

## 2017-11-24 ENCOUNTER — Encounter (INDEPENDENT_AMBULATORY_CARE_PROVIDER_SITE_OTHER): Payer: Medicare Other | Admitting: Ophthalmology

## 2017-11-24 DIAGNOSIS — H43813 Vitreous degeneration, bilateral: Secondary | ICD-10-CM | POA: Diagnosis not present

## 2017-11-24 DIAGNOSIS — I10 Essential (primary) hypertension: Secondary | ICD-10-CM

## 2017-11-24 DIAGNOSIS — H353211 Exudative age-related macular degeneration, right eye, with active choroidal neovascularization: Secondary | ICD-10-CM | POA: Diagnosis not present

## 2017-11-24 DIAGNOSIS — H353124 Nonexudative age-related macular degeneration, left eye, advanced atrophic with subfoveal involvement: Secondary | ICD-10-CM | POA: Diagnosis not present

## 2017-11-24 DIAGNOSIS — H35033 Hypertensive retinopathy, bilateral: Secondary | ICD-10-CM

## 2017-11-26 DIAGNOSIS — R739 Hyperglycemia, unspecified: Secondary | ICD-10-CM | POA: Diagnosis not present

## 2017-11-26 DIAGNOSIS — E669 Obesity, unspecified: Secondary | ICD-10-CM | POA: Diagnosis not present

## 2017-11-26 DIAGNOSIS — H353290 Exudative age-related macular degeneration, unspecified eye, stage unspecified: Secondary | ICD-10-CM | POA: Diagnosis not present

## 2017-11-26 DIAGNOSIS — I10 Essential (primary) hypertension: Secondary | ICD-10-CM | POA: Diagnosis not present

## 2017-11-26 DIAGNOSIS — N183 Chronic kidney disease, stage 3 (moderate): Secondary | ICD-10-CM | POA: Diagnosis not present

## 2017-11-26 DIAGNOSIS — E875 Hyperkalemia: Secondary | ICD-10-CM | POA: Diagnosis not present

## 2017-11-26 DIAGNOSIS — I7789 Other specified disorders of arteries and arterioles: Secondary | ICD-10-CM | POA: Diagnosis not present

## 2017-11-29 ENCOUNTER — Encounter (INDEPENDENT_AMBULATORY_CARE_PROVIDER_SITE_OTHER): Payer: Medicare Other | Admitting: Ophthalmology

## 2017-12-02 ENCOUNTER — Other Ambulatory Visit: Payer: Self-pay | Admitting: Nephrology

## 2017-12-02 DIAGNOSIS — I10 Essential (primary) hypertension: Secondary | ICD-10-CM

## 2017-12-02 DIAGNOSIS — N183 Chronic kidney disease, stage 3 unspecified: Secondary | ICD-10-CM

## 2017-12-13 ENCOUNTER — Ambulatory Visit
Admission: RE | Admit: 2017-12-13 | Discharge: 2017-12-13 | Disposition: A | Discharge status: Home / Self Care | Payer: Medicare Other | Source: Ambulatory Visit | Attending: Nephrology | Admitting: Nephrology

## 2017-12-13 DIAGNOSIS — I150 Renovascular hypertension: Secondary | ICD-10-CM | POA: Diagnosis not present

## 2017-12-13 DIAGNOSIS — I10 Essential (primary) hypertension: Secondary | ICD-10-CM

## 2017-12-13 DIAGNOSIS — N183 Chronic kidney disease, stage 3 unspecified: Secondary | ICD-10-CM

## 2017-12-13 DIAGNOSIS — N189 Chronic kidney disease, unspecified: Secondary | ICD-10-CM | POA: Diagnosis not present

## 2018-02-14 DIAGNOSIS — N183 Chronic kidney disease, stage 3 (moderate): Secondary | ICD-10-CM | POA: Diagnosis not present

## 2018-03-31 DIAGNOSIS — R739 Hyperglycemia, unspecified: Secondary | ICD-10-CM | POA: Diagnosis not present

## 2018-03-31 DIAGNOSIS — I7789 Other specified disorders of arteries and arterioles: Secondary | ICD-10-CM | POA: Diagnosis not present

## 2018-03-31 DIAGNOSIS — I129 Hypertensive chronic kidney disease with stage 1 through stage 4 chronic kidney disease, or unspecified chronic kidney disease: Secondary | ICD-10-CM | POA: Diagnosis not present

## 2018-03-31 DIAGNOSIS — N183 Chronic kidney disease, stage 3 (moderate): Secondary | ICD-10-CM | POA: Diagnosis not present

## 2018-03-31 DIAGNOSIS — E875 Hyperkalemia: Secondary | ICD-10-CM | POA: Diagnosis not present

## 2018-03-31 DIAGNOSIS — E669 Obesity, unspecified: Secondary | ICD-10-CM | POA: Diagnosis not present

## 2018-03-31 DIAGNOSIS — H353290 Exudative age-related macular degeneration, unspecified eye, stage unspecified: Secondary | ICD-10-CM | POA: Diagnosis not present

## 2018-04-04 DIAGNOSIS — Z85828 Personal history of other malignant neoplasm of skin: Secondary | ICD-10-CM | POA: Diagnosis not present

## 2018-04-04 DIAGNOSIS — L720 Epidermal cyst: Secondary | ICD-10-CM | POA: Diagnosis not present

## 2018-04-04 DIAGNOSIS — L821 Other seborrheic keratosis: Secondary | ICD-10-CM | POA: Diagnosis not present

## 2018-04-27 ENCOUNTER — Encounter (INDEPENDENT_AMBULATORY_CARE_PROVIDER_SITE_OTHER): Payer: Medicare Other | Admitting: Ophthalmology

## 2018-04-27 DIAGNOSIS — H353124 Nonexudative age-related macular degeneration, left eye, advanced atrophic with subfoveal involvement: Secondary | ICD-10-CM

## 2018-04-27 DIAGNOSIS — I10 Essential (primary) hypertension: Secondary | ICD-10-CM | POA: Diagnosis not present

## 2018-04-27 DIAGNOSIS — H35033 Hypertensive retinopathy, bilateral: Secondary | ICD-10-CM | POA: Diagnosis not present

## 2018-04-27 DIAGNOSIS — H43813 Vitreous degeneration, bilateral: Secondary | ICD-10-CM | POA: Diagnosis not present

## 2018-04-27 DIAGNOSIS — H353211 Exudative age-related macular degeneration, right eye, with active choroidal neovascularization: Secondary | ICD-10-CM

## 2018-05-02 DIAGNOSIS — I10 Essential (primary) hypertension: Secondary | ICD-10-CM | POA: Diagnosis not present

## 2018-05-02 DIAGNOSIS — N183 Chronic kidney disease, stage 3 (moderate): Secondary | ICD-10-CM | POA: Diagnosis not present

## 2018-05-03 DIAGNOSIS — Z85828 Personal history of other malignant neoplasm of skin: Secondary | ICD-10-CM | POA: Diagnosis not present

## 2018-05-03 DIAGNOSIS — L720 Epidermal cyst: Secondary | ICD-10-CM | POA: Diagnosis not present

## 2018-05-10 DIAGNOSIS — Z0000 Encounter for general adult medical examination without abnormal findings: Secondary | ICD-10-CM | POA: Diagnosis not present

## 2018-05-10 DIAGNOSIS — K219 Gastro-esophageal reflux disease without esophagitis: Secondary | ICD-10-CM | POA: Diagnosis not present

## 2018-05-10 DIAGNOSIS — I10 Essential (primary) hypertension: Secondary | ICD-10-CM | POA: Diagnosis not present

## 2018-05-10 DIAGNOSIS — Z23 Encounter for immunization: Secondary | ICD-10-CM | POA: Diagnosis not present

## 2018-05-30 DIAGNOSIS — N183 Chronic kidney disease, stage 3 (moderate): Secondary | ICD-10-CM | POA: Diagnosis not present

## 2018-07-29 DIAGNOSIS — E875 Hyperkalemia: Secondary | ICD-10-CM | POA: Diagnosis not present

## 2018-07-29 DIAGNOSIS — I129 Hypertensive chronic kidney disease with stage 1 through stage 4 chronic kidney disease, or unspecified chronic kidney disease: Secondary | ICD-10-CM | POA: Diagnosis not present

## 2018-07-29 DIAGNOSIS — N189 Chronic kidney disease, unspecified: Secondary | ICD-10-CM | POA: Diagnosis not present

## 2018-07-29 DIAGNOSIS — R739 Hyperglycemia, unspecified: Secondary | ICD-10-CM | POA: Diagnosis not present

## 2018-07-29 DIAGNOSIS — D631 Anemia in chronic kidney disease: Secondary | ICD-10-CM | POA: Diagnosis not present

## 2018-07-29 DIAGNOSIS — E669 Obesity, unspecified: Secondary | ICD-10-CM | POA: Diagnosis not present

## 2018-07-29 DIAGNOSIS — H353290 Exudative age-related macular degeneration, unspecified eye, stage unspecified: Secondary | ICD-10-CM | POA: Diagnosis not present

## 2018-07-29 DIAGNOSIS — N183 Chronic kidney disease, stage 3 (moderate): Secondary | ICD-10-CM | POA: Diagnosis not present

## 2018-07-29 DIAGNOSIS — I7789 Other specified disorders of arteries and arterioles: Secondary | ICD-10-CM | POA: Diagnosis not present

## 2018-08-10 ENCOUNTER — Ambulatory Visit: Payer: Medicare Other | Admitting: Family Medicine

## 2018-08-11 ENCOUNTER — Encounter: Payer: Self-pay | Admitting: Family Medicine

## 2018-08-11 ENCOUNTER — Ambulatory Visit (INDEPENDENT_AMBULATORY_CARE_PROVIDER_SITE_OTHER): Payer: Medicare Other | Admitting: Family Medicine

## 2018-08-11 VITALS — BP 160/100 | HR 69 | Temp 97.4°F | Ht 64.0 in | Wt 180.0 lb

## 2018-08-11 DIAGNOSIS — I10 Essential (primary) hypertension: Secondary | ICD-10-CM | POA: Diagnosis not present

## 2018-08-11 DIAGNOSIS — Z7689 Persons encountering health services in other specified circumstances: Secondary | ICD-10-CM | POA: Diagnosis not present

## 2018-08-11 DIAGNOSIS — I773 Arterial fibromuscular dysplasia: Secondary | ICD-10-CM | POA: Diagnosis not present

## 2018-08-11 DIAGNOSIS — H3530 Unspecified macular degeneration: Secondary | ICD-10-CM | POA: Diagnosis not present

## 2018-08-11 NOTE — Progress Notes (Signed)
Michelle [name] is a 78 y.o. female  Chief Complaint  Patient presents with  . Establish Care    h/o of kidney disease , retinal problems , high blood pressure     HPI: Michelle [name] is a 78 y.o. female here to establish care with our office. She is a former patient of Dr. Sherren Mocha and she was seeing him annually. She follows with nephrology for her renal artery fibromuscular hyperplasia and HTN. She has had 9 renal angioplasty procedures.  She is married with 2 living (4 in total) grown children, 5 grandchildren, and 1 great-grandchild.   She has HTN d/t her kidney diease and her normal SBP is high 130-140's and pt states goal is 120's. She has nephro appt next week.   She has B/L macular degeneration (wet in Rt eye and dry in Lt eye). She receives injections in her eye every 4 mo or so x 9 years.   Specialists: nephro, retina specialist  Med refills needed today: none  Last mammo - 07/2016 - due but pt declines at this time   Past Medical History:  Diagnosis Date  . Allergic rhinitis   . Cystocele   . Fibromuscular dysplasia of renal artery ([place])   . Hypertension    fibromuscular dysplasia  . Osteoarthritis   . Osteoporosis   . PMS (premenstrual syndrome)   . Retinal hemorrhage, right    with macular degeneration    Past Surgical History:  Procedure Laterality Date  . ANGIOPLASTY     right renal 1981  . BTL    . cb x 3    . HEMORRHOID SURGERY    . IR PTA RENAL VISCERAL  05/11/2017  . IR RADIOLOGIST EVAL & MGMT  04/21/2017  . IR RENAL BILAT S&I MOD SED  05/11/2017  . IR US GUIDE VASC ACCESS RIGHT  05/11/2017  . Rt ovarian cyst removed    . TONSILLECTOMY AND ADENOIDECTOMY      Social History   Socioeconomic History  . Marital status: Married    Spouse name: Not on file  . Number of children: Not on file  . Years of education: Not on file  . Highest education level: Not on file  Occupational History  . Not on file  Social Needs  . Financial resource  strain: Not on file  . Food insecurity:    Worry: Not on file    Inability: Not on file  . Transportation needs:    Medical: Not on file    Non-medical: Not on file  Tobacco Use  . Smoking status: Former Research scientist (life sciences)  . Smokeless tobacco: Former Systems developer    Quit date: 10/27/1983  Substance and Sexual Activity  . Alcohol use: No  . Drug use: No  . Sexual activity: Not on file  Lifestyle  . Physical activity:    Days per week: Not on file    Minutes per session: Not on file  . Stress: Not on file  Relationships  . Social connections:    Talks on phone: Not on file    Gets together: Not on file    Attends religious service: Not on file    Active member of club or organization: Not on file    Attends meetings of clubs or organizations: Not on file    Relationship status: Not on file  . Intimate partner violence:    Fear of current or ex partner: Not on file    Emotionally abused: Not  on file    Physically abused: Not on file    Forced sexual activity: Not on file  Other Topics Concern  . Not on file  Social History Narrative  . Not on file    No family history on file.   Immunization History  Administered Date(s) Administered  . Influenza Split 05/08/2013  . Influenza Whole 04/26/2009  . Influenza, High Dose Seasonal PF 05/21/2014  . Pneumococcal Conjugate-13 11/01/2013  . Pneumococcal Polysaccharide-23 07/28/2003, 10/23/2008  . Td 07/27/2002  . Tdap 01/23/2013  . Zoster 01/23/2013    Outpatient Encounter Medications as of 08/11/2018  Medication Sig Note  . aspirin EC 81 MG tablet Take 81 mg by mouth daily.   . beta carotene w/minerals (OCUVITE) tablet Take 1 tablet by mouth daily.   . Bevacizumab (AVASTIN) 100 MG/4ML SOLN Inject 1-10 mg into the eye See admin instructions. For eyes One every 19 weeks 1/10 mg 05/04/2017: Next dose:06/27/17  . cetirizine (ZYRTEC) 10 MG tablet Take 10 mg by mouth daily as needed (for allergies.).    Marland Kitchen furosemide (LASIX) 20 MG tablet Take 10 mg  by mouth daily.   Marland Kitchen lisinopril (PRINIVIL,ZESTRIL) 20 MG tablet Take 20 mg by mouth daily.   . naproxen sodium (ANAPROX) 220 MG tablet Take 440 mg by mouth at bedtime as needed (for pain.).   Marland Kitchen Omega-3 1000 MG CAPS Take 1,000 mg by mouth daily.   Marland Kitchen omeprazole (PRILOSEC) 20 MG capsule Take 20 mg by mouth daily before breakfast.   . predniSONE (DELTASONE) 50 MG tablet Take 50 mg by mouth See admin instructions. Take 1 tablet (50 mg) by mouth at 2100, 0300, & 0900 before procedure. 05/04/2017: 2100, 0300, 0900   No facility-administered encounter medications on file as of 08/11/2018.      ROS: Gen: no fever, chills  Skin: no rash, itching ENT: no ear pain, ear drainage, nasal congestion, rhinorrhea, sinus pressure, sore throat Eyes: legally blind Resp: no cough, wheeze,SOB CV: no CP, palpitations, LE edema,  GI: no heartburn, n/v/d/c, abd pain GU: no dysuria, urgency, frequency, hematuria  MSK: no joint pain, myalgias, back pain Neuro: no dizziness, headache, weakness Psych: no depression, anxiety, insomnia   Allergies  Allergen Reactions  . Iodinated Diagnostic Agents Hives    Patient believes this was an older form of iodinated contrast.  Tolerated OMNIPAQUE IOHEXOL 11/19/14 for renal arteriogram/angioplasty without prep. [name], RN  . Diphenhydramine Hcl Hives    BP (!) 160/100   Pulse 69   Temp (!) 97.4 F (36.3 C) (Oral)   Ht 5\' 4"  (1.626 m)   Wt 180 lb (81.6 kg)   SpO2 96%   BMI 30.90 kg/m   BP Readings from Last 3 Encounters:  08/11/18 (!) 160/100  05/11/17 (!) 118/54  04/21/17 (!) 133/50     Physical Exam  Constitutional: She is oriented to person, place, and time. She appears well-developed and well-nourished. No distress.  Cardiovascular: Normal rate and regular rhythm.  Pulmonary/Chest: Effort normal and breath sounds normal.  Neurological: She is alert and oriented to person, place, and time.  Psychiatric: She has a normal mood and affect. Her behavior is  normal.     A/P:  1. Essential hypertension, benign - elevated above pts baseline and above goal - pt has f/u with nephro in a few days and plans to discuss with him - she declines any med adjustment today  2. Macular degeneration of both eyes, unspecified type - cont regular f/u with retina  specialist - next appt in 08/2018  3. Hyperplasia, renal artery fibromuscular ([facility]) - cont regular f/u with nephro - next appt in 1 week  4. Encounter to establish care with new doctor - will plan to see pt annually at her request, as she sees specialists every 3-4 mo - she states she has labs every 3-4 mo

## 2018-08-16 DIAGNOSIS — N183 Chronic kidney disease, stage 3 (moderate): Secondary | ICD-10-CM | POA: Diagnosis not present

## 2018-08-16 DIAGNOSIS — I10 Essential (primary) hypertension: Secondary | ICD-10-CM | POA: Diagnosis not present

## 2018-08-25 ENCOUNTER — Encounter: Payer: Self-pay | Admitting: Family Medicine

## 2018-09-21 ENCOUNTER — Encounter (INDEPENDENT_AMBULATORY_CARE_PROVIDER_SITE_OTHER): Payer: Medicare Other | Admitting: Ophthalmology

## 2018-09-21 DIAGNOSIS — I10 Essential (primary) hypertension: Secondary | ICD-10-CM | POA: Diagnosis not present

## 2018-09-21 DIAGNOSIS — H35033 Hypertensive retinopathy, bilateral: Secondary | ICD-10-CM

## 2018-09-21 DIAGNOSIS — H353231 Exudative age-related macular degeneration, bilateral, with active choroidal neovascularization: Secondary | ICD-10-CM

## 2018-09-21 DIAGNOSIS — H43813 Vitreous degeneration, bilateral: Secondary | ICD-10-CM | POA: Diagnosis not present

## 2018-09-25 IMAGING — US US RENAL ARTERY STENOSIS
1 series · 13 of 25 positions shown · non-contrast
Comparison: Angiogram 11/19/2014, and previous studies

CLINICAL DATA: Hypertension, Previous balloon angioplasty of right
renal FMD 6886, left renal FMD 11/19/2014.

EXAM:
RENAL/URINARY TRACT ULTRASOUND
RENAL DUPLEX DOPPLER ULTRASOUND

[Series 1: us renal artery stenosis · 0.23mm/px · 13 of 78 slices shown]
[im 1/78]
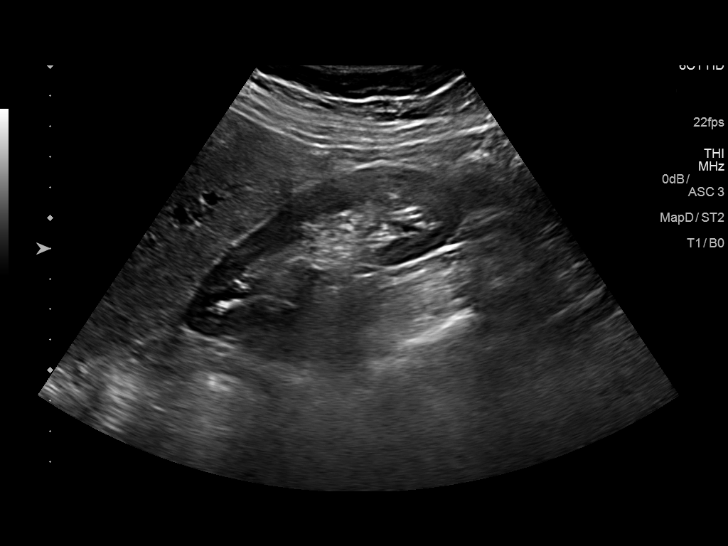
[im 7/78]
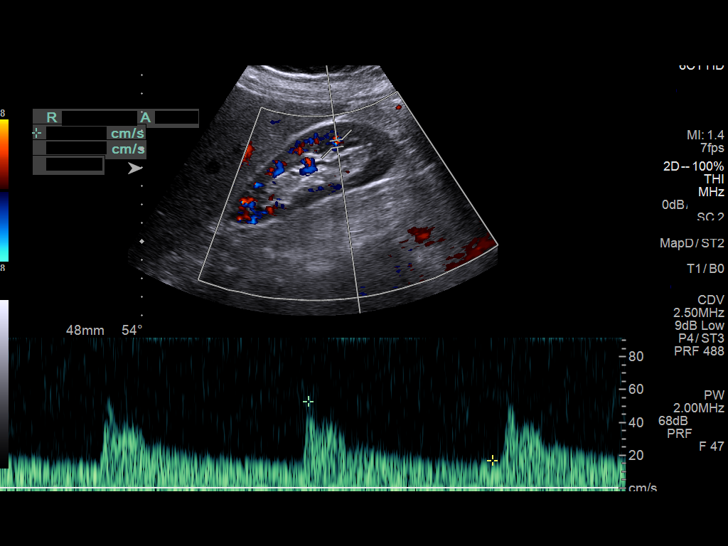
[im 13/78]
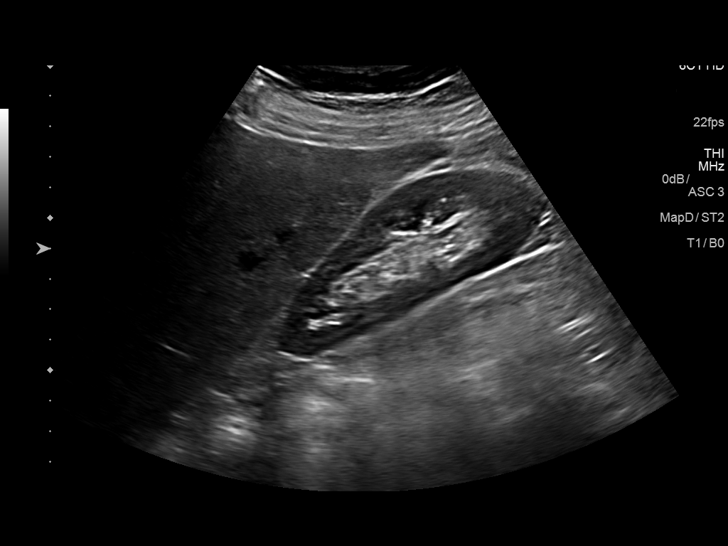
[im 20/78]
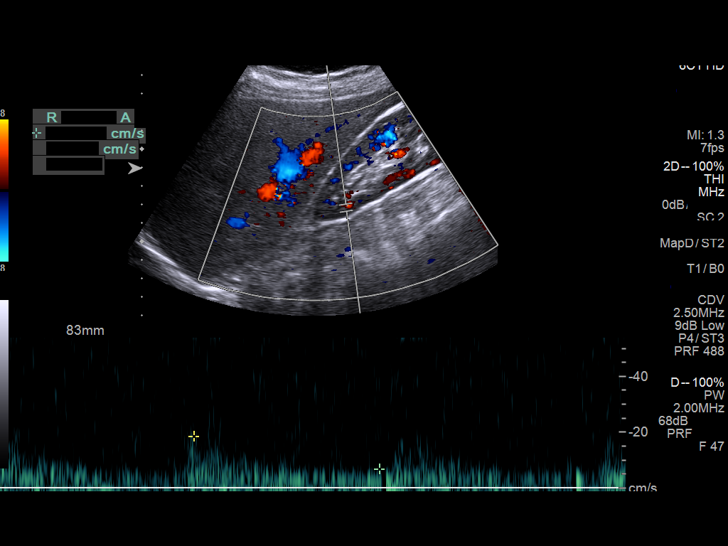
[im 26/78]
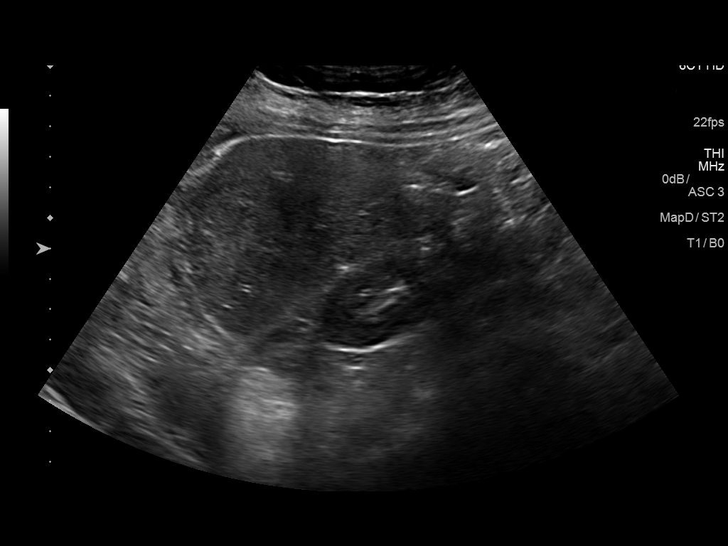
[im 33/78]
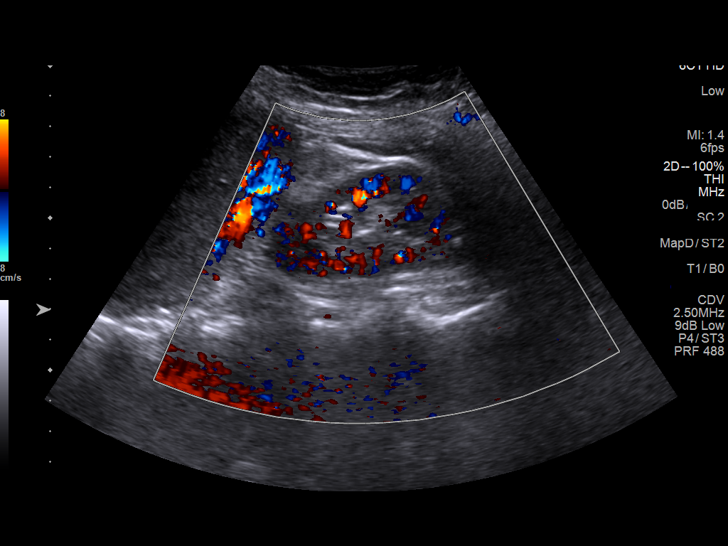
[im 39/78]
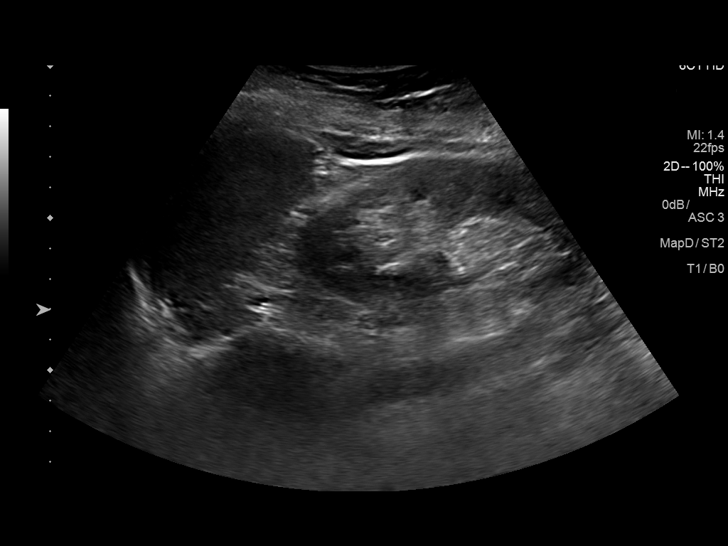
[im 45/78]
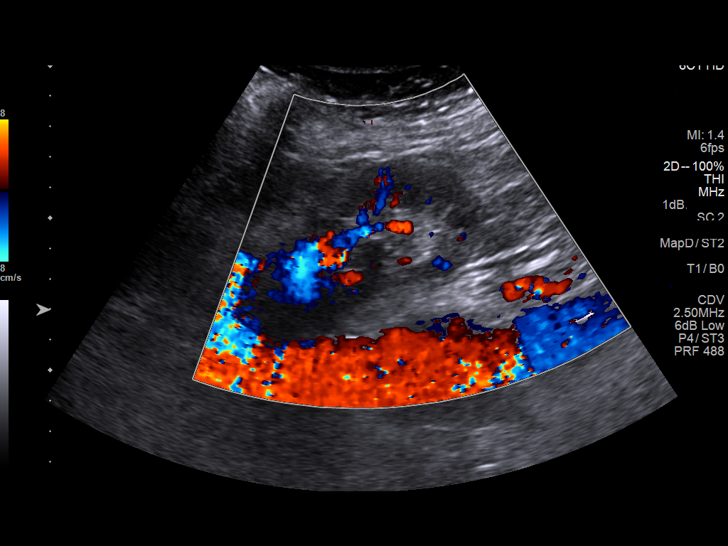
[im 52/78]
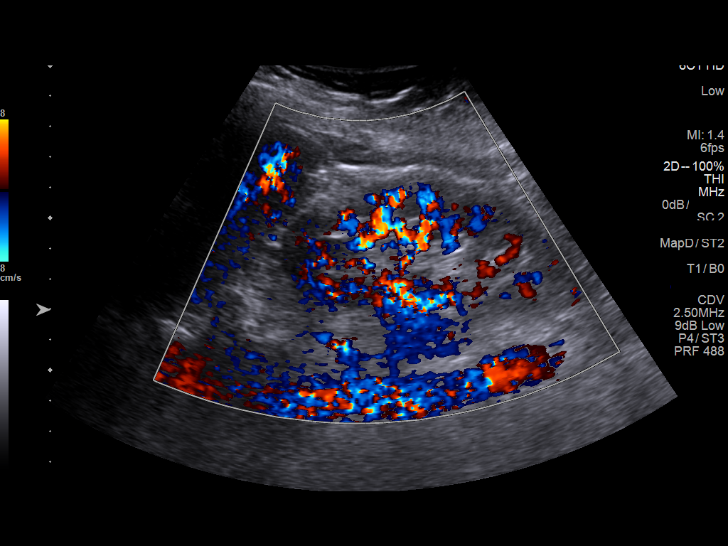
[im 58/78]
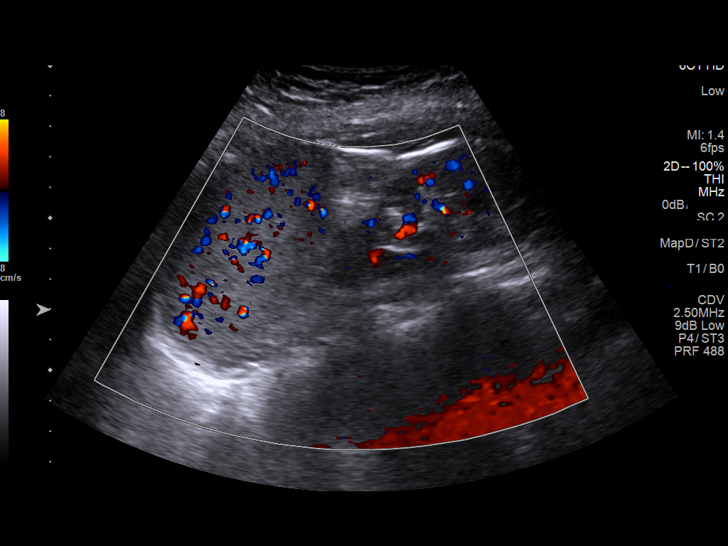
[im 65/78]
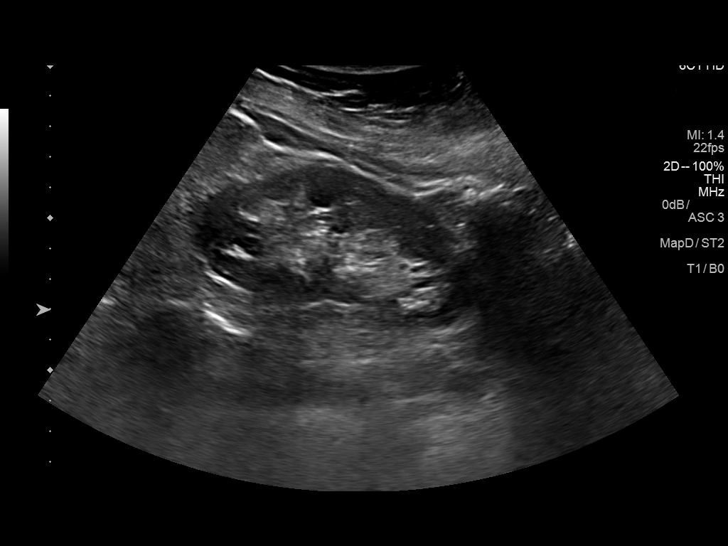
[im 71/78]
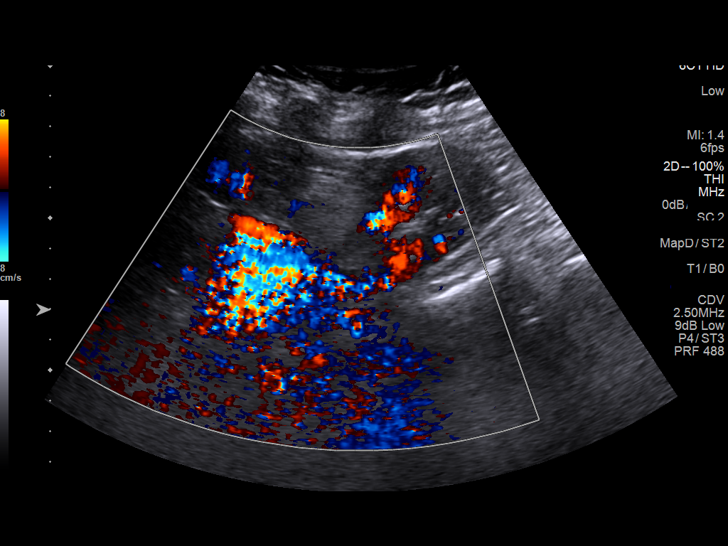
[im 78/78]
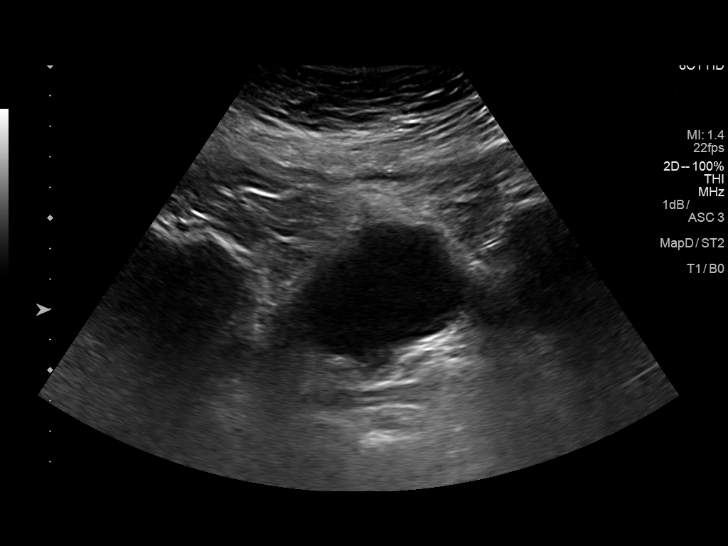

[13 of 25 positions shown; findings below may reference images not displayed]

FINDINGS: Right Kidney:

Length: 10.3 cm. Echogenicity within normal limits. No mass or
hydronephrosis visualized. Antegrade flow in the right renal vein.

Left Kidney:

Length: 10.2 cm. Echogenicity within normal limits. No mass or
hydronephrosis visualized. Antegrade flow in the left renal vein.

Bladder:  Physiologically distended, unremarkable

RENAL DUPLEX ULTRASOUND

Right Renal Artery Velocities:

Origin:  151 cm/sec

Mid:  172 cm/sec

Hilum:  200 cm/sec

Interlobar:  57 cm/sec

Arcuate:  21 Cm/sec

Left Renal Artery Velocities:

Origin:  240 cm/sec

Mid:  237 cm/sec

Hilum:  97 cm/sec

Interlobar:  67 cm/sec

Arcuate:  22 cm/sec

Aortic Velocity:  145 Cm/sec

Right Renal-Aortic Ratios:

Origin:

Mid:

Hilum:

Interlobar:

Arcuate:

Left Renal-Aortic Ratios:

Origin:

Mid:

Hilum:

Interlobar:

Arcuate:
IMPRESSION: 1. Mildly elevated velocities in the LEFT renal artery. If there is
continued clinical concern of a renovascular component of the
patient's hypertension, consider follow-up renal arteriography with
pressure measurements to assess the hemodynamic significance of
these velocity changes.

## 2019-01-09 DIAGNOSIS — N183 Chronic kidney disease, stage 3 (moderate): Secondary | ICD-10-CM | POA: Diagnosis not present

## 2019-01-10 DIAGNOSIS — Z20828 Contact with and (suspected) exposure to other viral communicable diseases: Secondary | ICD-10-CM | POA: Diagnosis not present

## 2019-02-01 DIAGNOSIS — H353290 Exudative age-related macular degeneration, unspecified eye, stage unspecified: Secondary | ICD-10-CM | POA: Diagnosis not present

## 2019-02-01 DIAGNOSIS — I7789 Other specified disorders of arteries and arterioles: Secondary | ICD-10-CM | POA: Diagnosis not present

## 2019-02-01 DIAGNOSIS — D631 Anemia in chronic kidney disease: Secondary | ICD-10-CM | POA: Diagnosis not present

## 2019-02-01 DIAGNOSIS — N183 Chronic kidney disease, stage 3 (moderate): Secondary | ICD-10-CM | POA: Diagnosis not present

## 2019-02-01 DIAGNOSIS — E669 Obesity, unspecified: Secondary | ICD-10-CM | POA: Diagnosis not present

## 2019-02-01 DIAGNOSIS — E875 Hyperkalemia: Secondary | ICD-10-CM | POA: Diagnosis not present

## 2019-02-01 DIAGNOSIS — I129 Hypertensive chronic kidney disease with stage 1 through stage 4 chronic kidney disease, or unspecified chronic kidney disease: Secondary | ICD-10-CM | POA: Diagnosis not present

## 2019-02-01 DIAGNOSIS — R739 Hyperglycemia, unspecified: Secondary | ICD-10-CM | POA: Diagnosis not present

## 2019-02-08 ENCOUNTER — Encounter (INDEPENDENT_AMBULATORY_CARE_PROVIDER_SITE_OTHER): Payer: Medicare Other | Admitting: Ophthalmology

## 2019-02-08 ENCOUNTER — Other Ambulatory Visit: Payer: Self-pay

## 2019-02-08 DIAGNOSIS — H353231 Exudative age-related macular degeneration, bilateral, with active choroidal neovascularization: Secondary | ICD-10-CM

## 2019-02-08 DIAGNOSIS — I10 Essential (primary) hypertension: Secondary | ICD-10-CM

## 2019-02-08 DIAGNOSIS — H43813 Vitreous degeneration, bilateral: Secondary | ICD-10-CM

## 2019-02-08 DIAGNOSIS — H35033 Hypertensive retinopathy, bilateral: Secondary | ICD-10-CM

## 2019-04-05 ENCOUNTER — Ambulatory Visit (INDEPENDENT_AMBULATORY_CARE_PROVIDER_SITE_OTHER): Payer: Medicare Other | Admitting: Behavioral Health

## 2019-04-05 ENCOUNTER — Other Ambulatory Visit: Payer: Self-pay

## 2019-04-05 ENCOUNTER — Encounter: Payer: Self-pay | Admitting: Behavioral Health

## 2019-04-05 DIAGNOSIS — Z23 Encounter for immunization: Secondary | ICD-10-CM | POA: Diagnosis not present

## 2019-04-05 NOTE — Progress Notes (Signed)
Patient presents in clinic today for Influenza vaccination. IM injection was given in the left deltoid. Patient tolerated the injection well. No signs or symptoms of a reaction were noted prior to patient leaving the nurse visit. 

## 2019-05-31 DIAGNOSIS — E669 Obesity, unspecified: Secondary | ICD-10-CM | POA: Diagnosis not present

## 2019-05-31 DIAGNOSIS — R739 Hyperglycemia, unspecified: Secondary | ICD-10-CM | POA: Diagnosis not present

## 2019-05-31 DIAGNOSIS — N189 Chronic kidney disease, unspecified: Secondary | ICD-10-CM | POA: Diagnosis not present

## 2019-05-31 DIAGNOSIS — H353290 Exudative age-related macular degeneration, unspecified eye, stage unspecified: Secondary | ICD-10-CM | POA: Diagnosis not present

## 2019-05-31 DIAGNOSIS — I129 Hypertensive chronic kidney disease with stage 1 through stage 4 chronic kidney disease, or unspecified chronic kidney disease: Secondary | ICD-10-CM | POA: Diagnosis not present

## 2019-05-31 DIAGNOSIS — I7789 Other specified disorders of arteries and arterioles: Secondary | ICD-10-CM | POA: Diagnosis not present

## 2019-05-31 DIAGNOSIS — N183 Chronic kidney disease, stage 3 unspecified: Secondary | ICD-10-CM | POA: Diagnosis not present

## 2019-05-31 DIAGNOSIS — E875 Hyperkalemia: Secondary | ICD-10-CM | POA: Diagnosis not present

## 2019-06-28 ENCOUNTER — Encounter (INDEPENDENT_AMBULATORY_CARE_PROVIDER_SITE_OTHER): Payer: Medicare Other | Admitting: Ophthalmology

## 2019-06-28 DIAGNOSIS — H35033 Hypertensive retinopathy, bilateral: Secondary | ICD-10-CM | POA: Diagnosis not present

## 2019-06-28 DIAGNOSIS — I10 Essential (primary) hypertension: Secondary | ICD-10-CM | POA: Diagnosis not present

## 2019-06-28 DIAGNOSIS — H353231 Exudative age-related macular degeneration, bilateral, with active choroidal neovascularization: Secondary | ICD-10-CM | POA: Diagnosis not present

## 2019-06-28 DIAGNOSIS — H43813 Vitreous degeneration, bilateral: Secondary | ICD-10-CM

## 2019-08-05 IMAGING — XA IR US GUIDE VASC ACCESS RIGHT
10 of 12 series · 12 of 24 positions shown · IV contrast (IODINE)
Comparison: none

INDICATION: History of renal artery fibromuscular dysplasia and status post left
renal artery angioplasty on 11/19/2014 and prior right renal artery
angioplasty in 8958. Recent increase in serum creatinine as well as
hypertension with renal duplex ultrasound demonstrating increased
velocities in the left renal artery.

[Series 1: body 4 care · 2 acquisitions, 1 frame shown (1 of 8)]
[im 1/2]
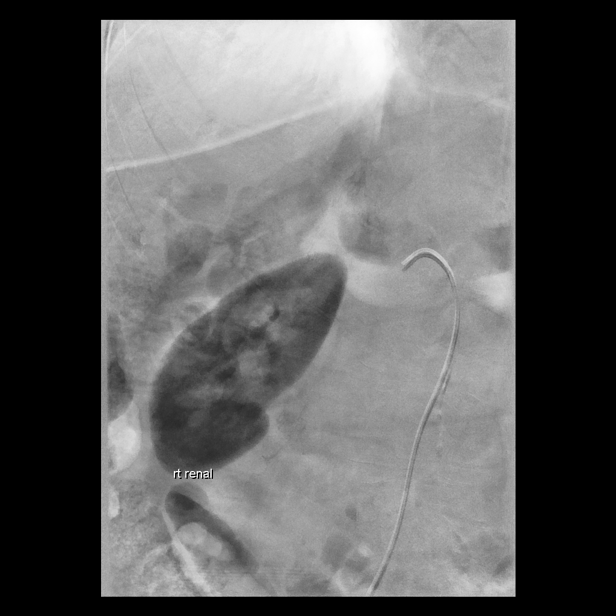

[Series 2: body 4 care · 2 acquisitions, 1 frame shown (2 of 8)]
[im 1/2]
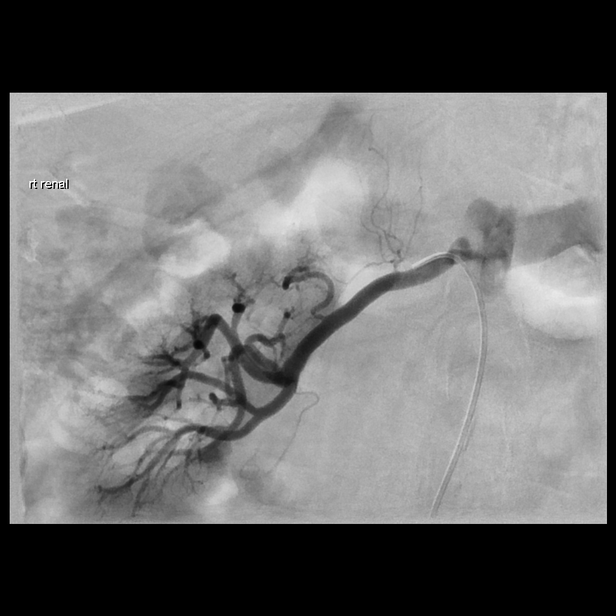

[Series 3: body 4 care · 2 acquisitions, 1 frame shown (3 of 8)]
[im 1/2]
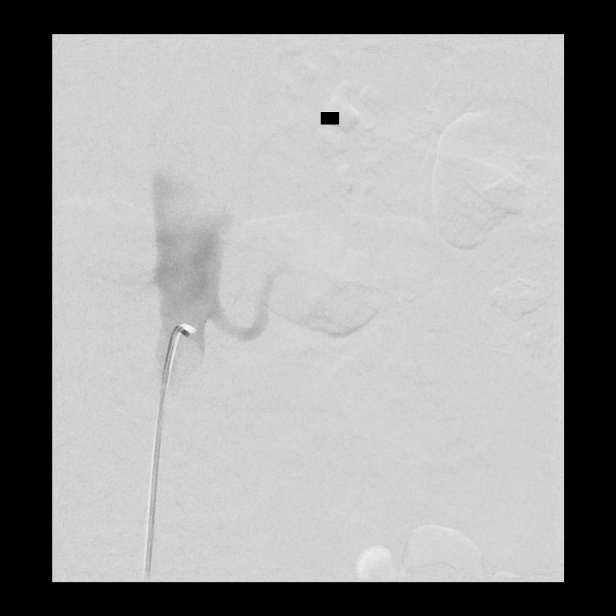

[Series 4: body 4 care · 2 acquisitions, 1 frame shown (4 of 8)]
[im 1/2]
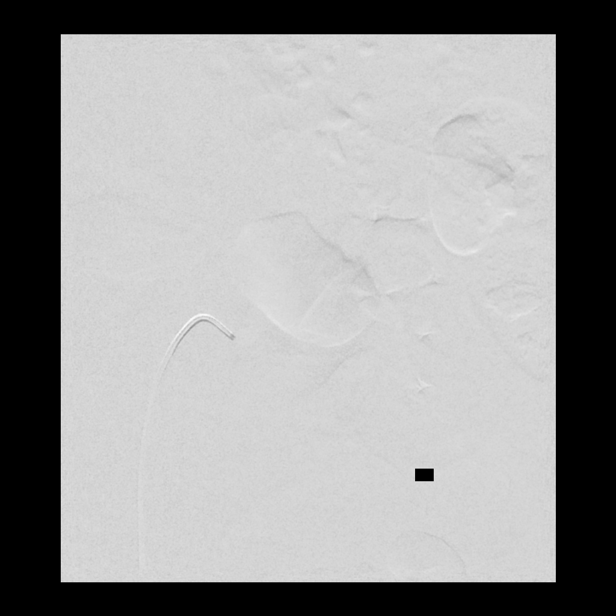

[Series 5: body 4 care · 1 of 13 frames shown (5 of 8)]
[frame 2/13]
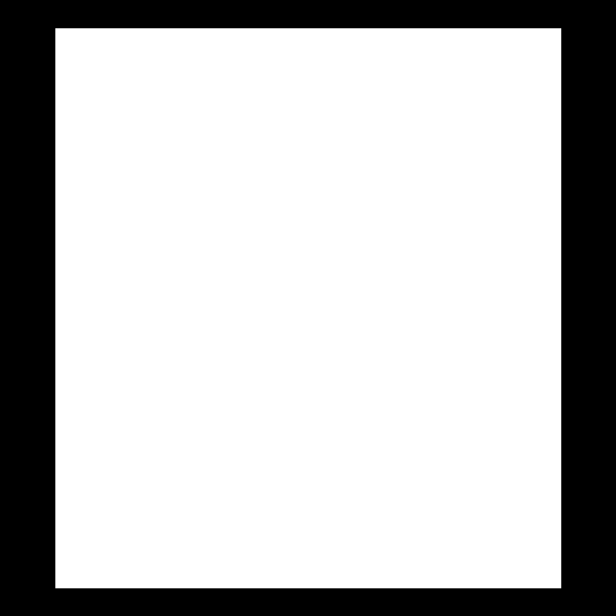

[Series 6: body 4 care · 2 acquisitions, 2 frames shown (6 of 8)]
[im 1/2]
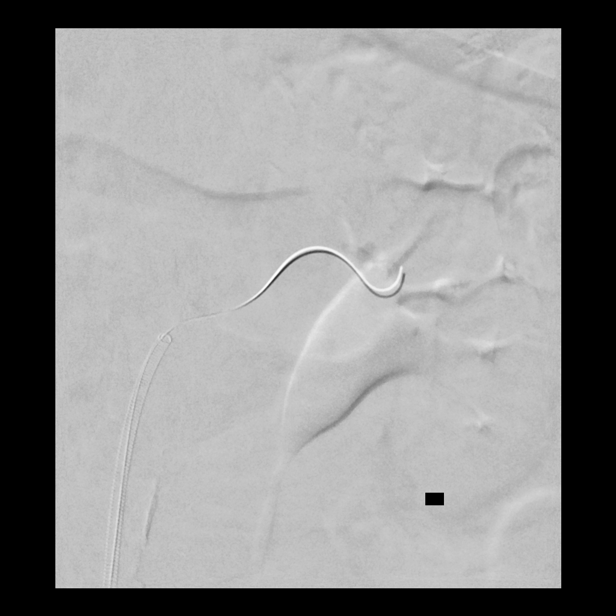
[im 2/2]
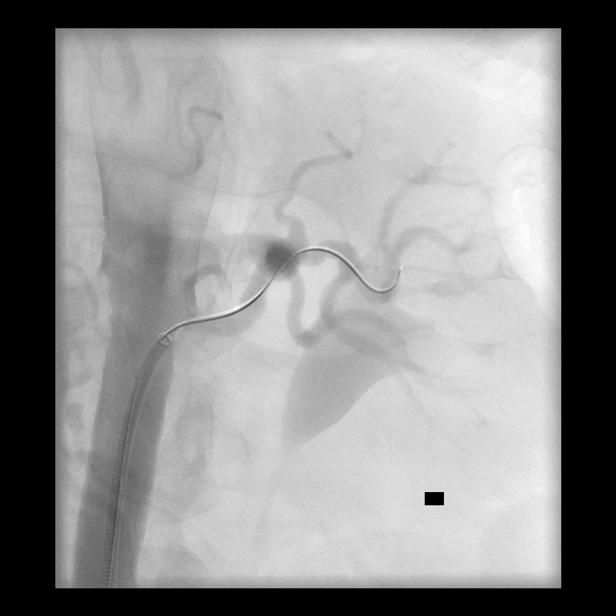

[Series 10: body 4 care · 1 of 13 frames shown (7 of 8)]
[frame 2/13]
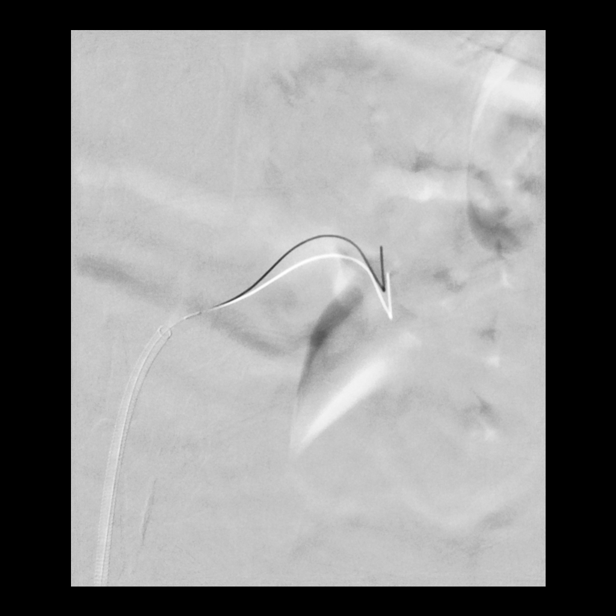

[Series 11: fl (-) angio · 1 of 2 slices shown (1 of 2)]
[im 1/2]
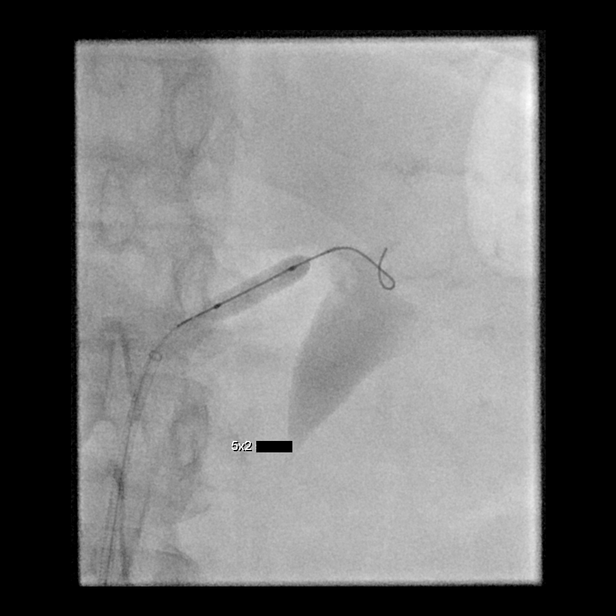

[Series 13: body 4 care · 2 acquisitions, 1 frame shown (8 of 8)]
[im 1/2]
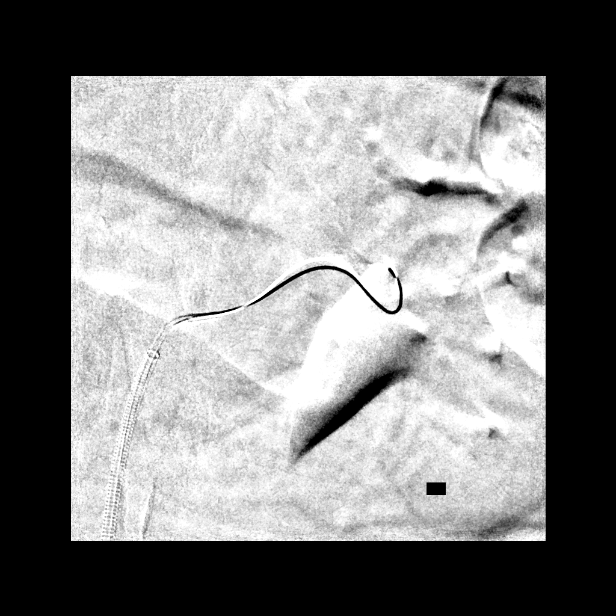

[Series 14: fl (-) angio · 2 acquisitions, 2 frames shown (2 of 2)]
[im 1/2]
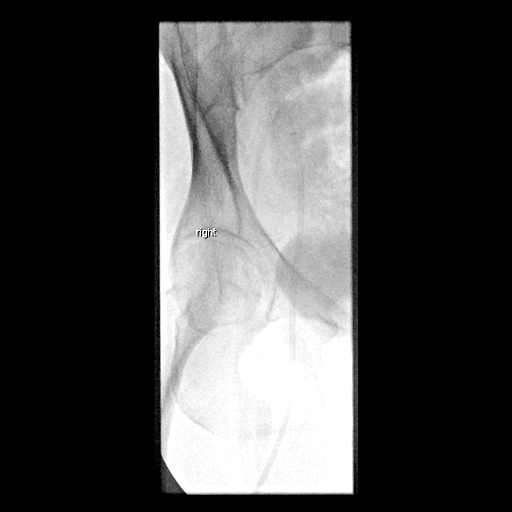
[im 2/2]
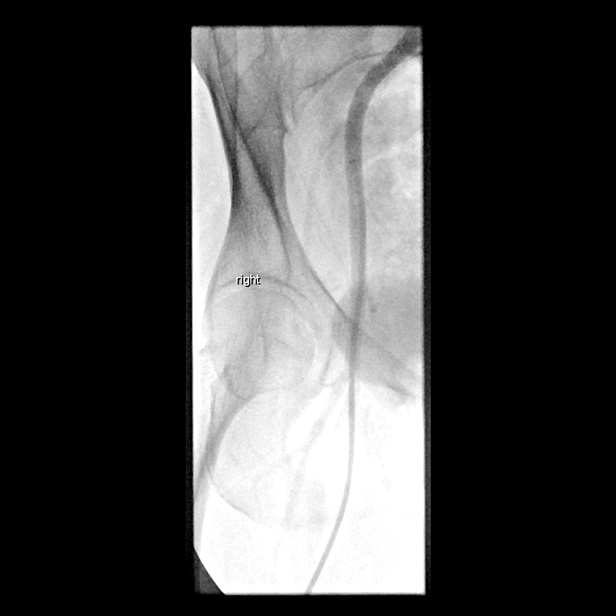

[12 of 24 positions shown; findings below may reference images not displayed]

EXAM:
1. ULTRASOUND GUIDANCE FOR VASCULAR ACCESS OF THE RIGHT COMMON
FEMORAL ARTERY
2. BILATERAL SELECTIVE RENAL ARTERIOGRAPHY
3. ANGIOPLASTY OF LEFT RENAL ARTERY

MEDICATIONS:
2111 units intravenous heparin administered during the procedure.

ANESTHESIA/SEDATION:
Moderate (conscious) sedation was employed during this procedure. A
total of Versed 2.0 mg and Fentanyl 100 mcg was administered
intravenously.

Moderate Sedation Time: 91 minutes. The patient's level of
consciousness and vital signs were monitored continuously by
radiology nursing throughout the procedure under my direct
supervision.

CONTRAST:  75 mL Rsovue-HWW

FLUOROSCOPY TIME:  Fluoroscopy Time: 22 minutes and 18 seconds
(580.5 mGy).

COMPLICATIONS:
None immediate.

PROCEDURE:
Informed consent was obtained from the patient following explanation
of the procedure, risks, benefits and alternatives. The patient
understands, agrees and consents for the procedure. All questions
were addressed. A time out was performed prior to the initiation of
the procedure. Maximal barrier sterile technique utilized including
caps, mask, sterile gowns, sterile gloves, large sterile drape, hand
hygiene, and chlorhexidine prep. Local anesthesia was provided with
1% lidocaine.

Ultrasound was used to confirm patency of the right common femoral
artery. Ultrasound image documentation was performed. The artery was
accessed utilizing a micropuncture set. After securing guidewire
access, a 5 French sheath was placed. A 5 French Cobra catheter was
introduced and used to selectively catheterize the right renal
artery. Selective arteriography was performed. The catheter was then
retracted and a pull-back pressure gradient measurement obtained
across the proximal right renal artery and into the aorta.

The Cobra catheter was then used to selectively catheterize the left
renal artery. Selective arteriography was performed. A 6 French
angles sheath was then advanced over a guidewire into the mid
abdominal aorta. The left renal artery was again catheterized. A
0.018 inch wire was then advanced into the left renal artery and
used to select a segmental branch supplying the mid to upper kidney.

Balloon angioplasty was performed at the level of a a segmental left
renal artery branch with a 4 mm x 2 cm angioplasty balloon. After
two overlapping inflations, the balloon was deflated and removed.
Additional arteriography was performed through the sheath.

Additional balloon angioplasty was performed at the level of the
distal and mid left renal artery utilizing a 5 mm x 2 cm angioplasty
balloon. After 2 separate overlapping inflations the balloon was
deflated and removed. Additional arteriography was performed through
the 5 French catheter positioned in the proximal left renal artery.

The catheter was removed. The 6 French sheath was exchanged for a
short 6 French sheath. Arteriotomy closure was performed with the
Cordis Exoseal device.
FINDINGS: Right renal arteriography shows no evidence of renal artery stenosis
or findings of significant fibromuscular dysplasia. There is mild
luminal irregularity of a fourth order parenchymal branch likely
representing some degree of fibromuscular disease. There also is
minimal luminal regularity of the origin of the right renal artery.
For this reason, a pull-back pressure measurement was performed
showing no pressure gradient across the proximal right renal artery.

Left renal arteriography demonstrates irregular beading and web
formation within the mid to distal aspect of the left main renal
artery extending into superior and interpolar segmental branches.
Balloon angioplasty was performed with the same size balloons
utilized in 2405 with 4 mm angioplasty across the interpolar
segmental disease and 5 mm angioplasty across the distal and mid
segments of the left renal artery. Completion angiography shows
improved patency of the segments of fibromuscular dysplasia after
angioplasty.
IMPRESSION: Recurrent segments of fibromuscular dysplasia treated at the level
of the mid to distal left main renal artery and interpolar segmental
branch with 4 mm 5 mm balloon angioplasty. Angiography shows
improved patency after angioplasty.

## 2019-08-14 DIAGNOSIS — S86012D Strain of left Achilles tendon, subsequent encounter: Secondary | ICD-10-CM | POA: Diagnosis not present

## 2019-08-14 DIAGNOSIS — M7662 Achilles tendinitis, left leg: Secondary | ICD-10-CM | POA: Diagnosis not present

## 2019-08-22 DIAGNOSIS — N183 Chronic kidney disease, stage 3 unspecified: Secondary | ICD-10-CM | POA: Diagnosis not present

## 2019-09-04 DIAGNOSIS — M7662 Achilles tendinitis, left leg: Secondary | ICD-10-CM | POA: Diagnosis not present

## 2019-09-12 DIAGNOSIS — S86012D Strain of left Achilles tendon, subsequent encounter: Secondary | ICD-10-CM | POA: Diagnosis not present

## 2019-09-17 DIAGNOSIS — H5316 Psychophysical visual disturbances: Secondary | ICD-10-CM | POA: Insufficient documentation

## 2019-09-19 DIAGNOSIS — S86012D Strain of left Achilles tendon, subsequent encounter: Secondary | ICD-10-CM | POA: Diagnosis not present

## 2019-09-21 DIAGNOSIS — S86012D Strain of left Achilles tendon, subsequent encounter: Secondary | ICD-10-CM | POA: Diagnosis not present

## 2019-09-26 DIAGNOSIS — S86012D Strain of left Achilles tendon, subsequent encounter: Secondary | ICD-10-CM | POA: Diagnosis not present

## 2019-09-28 DIAGNOSIS — S86012D Strain of left Achilles tendon, subsequent encounter: Secondary | ICD-10-CM | POA: Diagnosis not present

## 2019-10-02 DIAGNOSIS — S86012D Strain of left Achilles tendon, subsequent encounter: Secondary | ICD-10-CM | POA: Diagnosis not present

## 2019-10-03 DIAGNOSIS — S86012D Strain of left Achilles tendon, subsequent encounter: Secondary | ICD-10-CM | POA: Diagnosis not present

## 2019-10-05 DIAGNOSIS — S86012D Strain of left Achilles tendon, subsequent encounter: Secondary | ICD-10-CM | POA: Diagnosis not present

## 2019-10-09 DIAGNOSIS — S86012D Strain of left Achilles tendon, subsequent encounter: Secondary | ICD-10-CM | POA: Diagnosis not present

## 2019-10-12 DIAGNOSIS — S86012D Strain of left Achilles tendon, subsequent encounter: Secondary | ICD-10-CM | POA: Diagnosis not present

## 2019-10-13 ENCOUNTER — Other Ambulatory Visit: Payer: Self-pay

## 2019-10-13 ENCOUNTER — Encounter (INDEPENDENT_AMBULATORY_CARE_PROVIDER_SITE_OTHER): Payer: Medicare Other | Admitting: Ophthalmology

## 2019-10-13 DIAGNOSIS — H35033 Hypertensive retinopathy, bilateral: Secondary | ICD-10-CM | POA: Diagnosis not present

## 2019-10-13 DIAGNOSIS — H353231 Exudative age-related macular degeneration, bilateral, with active choroidal neovascularization: Secondary | ICD-10-CM

## 2019-10-13 DIAGNOSIS — I10 Essential (primary) hypertension: Secondary | ICD-10-CM

## 2019-10-13 DIAGNOSIS — H43813 Vitreous degeneration, bilateral: Secondary | ICD-10-CM | POA: Diagnosis not present

## 2019-10-17 DIAGNOSIS — S86012D Strain of left Achilles tendon, subsequent encounter: Secondary | ICD-10-CM | POA: Diagnosis not present

## 2019-10-19 DIAGNOSIS — S86012D Strain of left Achilles tendon, subsequent encounter: Secondary | ICD-10-CM | POA: Diagnosis not present

## 2019-11-15 DIAGNOSIS — N183 Chronic kidney disease, stage 3 unspecified: Secondary | ICD-10-CM | POA: Diagnosis not present

## 2019-11-22 DIAGNOSIS — E669 Obesity, unspecified: Secondary | ICD-10-CM | POA: Diagnosis not present

## 2019-11-22 DIAGNOSIS — H353290 Exudative age-related macular degeneration, unspecified eye, stage unspecified: Secondary | ICD-10-CM | POA: Diagnosis not present

## 2019-11-22 DIAGNOSIS — I129 Hypertensive chronic kidney disease with stage 1 through stage 4 chronic kidney disease, or unspecified chronic kidney disease: Secondary | ICD-10-CM | POA: Diagnosis not present

## 2019-11-22 DIAGNOSIS — R739 Hyperglycemia, unspecified: Secondary | ICD-10-CM | POA: Diagnosis not present

## 2019-11-22 DIAGNOSIS — N183 Chronic kidney disease, stage 3 unspecified: Secondary | ICD-10-CM | POA: Diagnosis not present

## 2019-11-22 DIAGNOSIS — E875 Hyperkalemia: Secondary | ICD-10-CM | POA: Diagnosis not present

## 2019-11-22 DIAGNOSIS — I7789 Other specified disorders of arteries and arterioles: Secondary | ICD-10-CM | POA: Diagnosis not present

## 2019-11-27 ENCOUNTER — Encounter (INDEPENDENT_AMBULATORY_CARE_PROVIDER_SITE_OTHER): Payer: Medicare Other | Admitting: Ophthalmology

## 2020-01-11 DIAGNOSIS — N183 Chronic kidney disease, stage 3 unspecified: Secondary | ICD-10-CM | POA: Diagnosis not present

## 2020-03-14 ENCOUNTER — Other Ambulatory Visit: Payer: Self-pay

## 2020-03-14 ENCOUNTER — Encounter (INDEPENDENT_AMBULATORY_CARE_PROVIDER_SITE_OTHER): Payer: Medicare Other | Admitting: Ophthalmology

## 2020-03-14 DIAGNOSIS — H353124 Nonexudative age-related macular degeneration, left eye, advanced atrophic with subfoveal involvement: Secondary | ICD-10-CM | POA: Diagnosis not present

## 2020-03-14 DIAGNOSIS — H353211 Exudative age-related macular degeneration, right eye, with active choroidal neovascularization: Secondary | ICD-10-CM

## 2020-03-14 DIAGNOSIS — H35033 Hypertensive retinopathy, bilateral: Secondary | ICD-10-CM | POA: Diagnosis not present

## 2020-03-14 DIAGNOSIS — H43813 Vitreous degeneration, bilateral: Secondary | ICD-10-CM | POA: Diagnosis not present

## 2020-03-14 DIAGNOSIS — I10 Essential (primary) hypertension: Secondary | ICD-10-CM

## 2020-03-15 ENCOUNTER — Ambulatory Visit (INDEPENDENT_AMBULATORY_CARE_PROVIDER_SITE_OTHER): Payer: Medicare Other | Admitting: Family Medicine

## 2020-03-15 ENCOUNTER — Encounter: Payer: Self-pay | Admitting: Family Medicine

## 2020-03-15 VITALS — BP 120/68 | HR 67 | Temp 98.7°F | Ht 64.0 in | Wt 161.0 lb

## 2020-03-15 DIAGNOSIS — M810 Age-related osteoporosis without current pathological fracture: Secondary | ICD-10-CM | POA: Diagnosis not present

## 2020-03-15 DIAGNOSIS — Z13220 Encounter for screening for lipoid disorders: Secondary | ICD-10-CM | POA: Diagnosis not present

## 2020-03-15 DIAGNOSIS — M159 Polyosteoarthritis, unspecified: Secondary | ICD-10-CM | POA: Diagnosis not present

## 2020-03-15 DIAGNOSIS — Z1231 Encounter for screening mammogram for malignant neoplasm of breast: Secondary | ICD-10-CM | POA: Diagnosis not present

## 2020-03-15 DIAGNOSIS — I10 Essential (primary) hypertension: Secondary | ICD-10-CM | POA: Diagnosis not present

## 2020-03-15 DIAGNOSIS — Z6827 Body mass index (BMI) 27.0-27.9, adult: Secondary | ICD-10-CM

## 2020-03-15 LAB — BASIC METABOLIC PANEL
BUN: 20 mg/dL (ref 6–23)
CO2: 27 mEq/L (ref 19–32)
Calcium: 10.2 mg/dL (ref 8.4–10.5)
Chloride: 104 mEq/L (ref 96–112)
Creatinine, Ser: 1.25 mg/dL — ABNORMAL HIGH (ref 0.40–1.20)
GFR: 41.11 mL/min — ABNORMAL LOW (ref >60.00)
Glucose, Bld: 119 mg/dL — ABNORMAL HIGH (ref 70–99)
Potassium: 4 mEq/L (ref 3.5–5.1)
Sodium: 140 mEq/L (ref 135–145)

## 2020-03-15 LAB — LIPID PANEL
Cholesterol: 174 mg/dL (ref 0–200)
HDL: 51.3 mg/dL (ref >39.00)
LDL Cholesterol: 95 mg/dL (ref 0–99)
NonHDL: 123.12
Total CHOL/HDL Ratio: 3
Triglycerides: 141 mg/dL (ref 0.0–149.0)
VLDL: 28.2 mg/dL (ref 0.0–40.0)

## 2020-03-15 LAB — AST: AST: 11 U/L (ref 0–37)

## 2020-03-15 LAB — VITAMIN D 25 HYDROXY (VIT D DEFICIENCY, FRACTURES): VITD: 26.15 ng/mL — ABNORMAL LOW (ref 30.00–100.00)

## 2020-03-15 LAB — ALT: ALT: 9 U/L (ref 0–35)

## 2020-03-15 NOTE — Patient Instructions (Signed)
[facility] [phone]

## 2020-03-15 NOTE — Progress Notes (Signed)
Michelle [name] is a 79 y.o. female  Chief Complaint  Patient presents with  . Annual Exam    CPE-pt is fasting//    HPI: Michelle [name] is a 79 y.o. female here for annual exam, f/u on chronic medical issues, fasting labs.   Last mammo: due Last Dexa: due Last colonoscopy: 04/2010 (Dr. Olevia Perches with [facility])  Appetite is good. Overall healthy diet.  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 Her current ophthalmologist is Dr. Zigmund Daniel   Past Medical History:  Diagnosis Date  . Allergic rhinitis   . Cystocele   . Fibromuscular dysplasia of renal artery ([place])   . Hypertension    fibromuscular dysplasia  . Osteoarthritis   . Osteoporosis   . PMS (premenstrual syndrome)   . Retinal hemorrhage, right    with macular degeneration    Past Surgical History:  Procedure Laterality Date  . ANGIOPLASTY     right renal 1981  . BTL    . cb x 3    . HEMORRHOID SURGERY    . IR PTA RENAL VISCERAL  05/11/2017  . IR RADIOLOGIST EVAL & MGMT  04/21/2017  . IR RENAL BILAT S&I MOD SED  05/11/2017  . IR US GUIDE VASC ACCESS RIGHT  05/11/2017  . Rt ovarian cyst removed    . TONSILLECTOMY AND ADENOIDECTOMY      Social History   Socioeconomic History  . Marital status: Married    Spouse name: Not on file  . Number of children: Not on file  . Years of education: Not on file  . Highest education level: Not on file  Occupational History  . Not on file  Tobacco Use  . Smoking status: Former Research scientist (life sciences)  . Smokeless tobacco: Former Systems developer    Quit date: 10/27/1983  Substance and Sexual Activity  . Alcohol use: No  . Drug use: No  . Sexual activity: Not on file  Other Topics Concern  . Not on file  Social History Narrative  . Not on file   Social Determinants of Health   Financial Resource Strain:   . Difficulty of Paying Living Expenses: Not on file  Food Insecurity:   . Worried  About Charity fundraiser in the Last Year: Not on file  . Ran Out of Food in the Last Year: Not on file  Transportation Needs:   . Lack of Transportation (Medical): Not on file  . Lack of Transportation (Non-Medical): Not on file  Physical Activity:   . Days of Exercise per Week: Not on file  . Minutes of Exercise per Session: Not on file  Stress:   . Feeling of Stress : Not on file  Social Connections:   . Frequency of Communication with Friends and Family: Not on file  . Frequency of Social Gatherings with Friends and Family: Not on file  . Attends Religious Services: Not on file  . Active Member of Clubs or Organizations: Not on file  . Attends Archivist Meetings: Not on file  . Marital Status: Not on file  Intimate Partner Violence:   . Fear of Current or Ex-Partner: Not on file  . Emotionally Abused: Not on file  . Physically Abused: Not on file  . Sexually Abused: Not on file    History reviewed. No pertinent family history.   Immunization History  Administered Date(s) Administered  . Fluad  Quad(high Dose 65+) 04/05/2019  . Influenza Split 05/08/2013  . Influenza Whole 04/26/2009  . Influenza, High Dose Seasonal PF 05/21/2014  . PFIZER SARS-COV-2 Vaccination 08/17/2019, 09/07/2019  . Pneumococcal Conjugate-13 11/01/2013  . Pneumococcal Polysaccharide-23 07/28/2003, 10/23/2008  . Td 07/27/2002  . Tdap 01/23/2013  . Zoster 01/23/2013    Outpatient Encounter Medications as of 03/15/2020  Medication Sig Note  . aspirin EC 81 MG tablet Take 81 mg by mouth daily.   . beta carotene w/minerals (OCUVITE) tablet Take 1 tablet by mouth daily.   . Bevacizumab (AVASTIN) 100 MG/4ML SOLN Inject 1-10 mg into the eye See admin instructions. For eyes One every 19 weeks 1/10 mg 05/04/2017: Next dose:06/27/17  . cetirizine (ZYRTEC) 10 MG tablet Take 10 mg by mouth daily as needed (for allergies.).    Marland Kitchen furosemide (LASIX) 20 MG tablet Take 10 mg by mouth daily.   Marland Kitchen  lisinopril (PRINIVIL,ZESTRIL) 20 MG tablet Take 20 mg by mouth daily.   . naproxen sodium (ANAPROX) 220 MG tablet Take 440 mg by mouth at bedtime as needed (for pain.).   Marland Kitchen omeprazole (PRILOSEC) 20 MG capsule Take 20 mg by mouth daily before breakfast.   . Omega-3 1000 MG CAPS Take 1,000 mg by mouth daily. (Patient not taking: Reported on 03/15/2020)   . predniSONE (DELTASONE) 50 MG tablet Take 50 mg by mouth See admin instructions. Take 1 tablet (50 mg) by mouth at 2100, 0300, & 0900 before procedure. (Patient not taking: Reported on 03/15/2020) 05/04/2017: 2100, 0300, 0900   No facility-administered encounter medications on file as of 03/15/2020.     ROS: Gen: no fever, chills  Skin: no rash, itching ENT: no ear pain, ear drainage, nasal congestion, rhinorrhea, sinus pressure, sore throat Eyes: as above in HPI Resp: no cough, wheeze,SOB CV: no CP, palpitations, LE edema,  GI: no heartburn, n/v/d/c, abd pain GU: no dysuria, urgency, frequency, hematuria MSK: no joint pain, myalgias, back pain Neuro: no dizziness, headache, weakness, vertigo Psych: no depression, anxiety, insomnia   Allergies  Allergen Reactions  . Iodinated Diagnostic Agents Hives    Patient believes this was an older form of iodinated contrast.  Tolerated OMNIPAQUE IOHEXOL 11/19/14 for renal arteriogram/angioplasty without prep. [name], RN  . Diphenhydramine Hcl Hives    BP 120/68   Pulse 67   Temp 98.7 F (37.1 C) (Tympanic)   Ht 5\' 4"  (1.626 m)   Wt 161 lb (73 kg)   SpO2 98%   BMI 27.64 kg/m   Physical Exam Constitutional:      General: She is not in acute distress.    Appearance: She is well-developed.  HENT:     Head: Normocephalic and atraumatic.     Right Ear: Tympanic membrane and ear canal normal.     Left Ear: Tympanic membrane and ear canal normal.     Nose: Nose normal.  Eyes:     Conjunctiva/sclera: Conjunctivae normal.     Pupils: Pupils are equal, round, and reactive to light.  Neck:       Thyroid: No thyromegaly.  Cardiovascular:     Rate and Rhythm: Normal rate and regular rhythm.     Heart sounds: Normal heart sounds. No murmur heard.   Pulmonary:     Effort: Pulmonary effort is normal. No respiratory distress.     Breath sounds: Normal breath sounds. No wheezing or rhonchi.  Abdominal:     General: Bowel sounds are normal. There is no distension.  Palpations: Abdomen is soft. There is no mass.     Tenderness: There is no abdominal tenderness.  Musculoskeletal:     Cervical back: Neck supple.  Lymphadenopathy:     Cervical: No cervical adenopathy.  Skin:    General: Skin is warm and dry.  Neurological:     Mental Status: She is alert and oriented to person, place, and time.     Motor: No abnormal muscle tone.     Coordination: Coordination normal.  Psychiatric:        Behavior: Behavior normal.      A/P:  1. Essential hypertension, benign - renal artery stenosis - controlled, cont current meds - Basic metabolic panel  2. Generalized OA - stable, at baseline  3. Age-related osteoporosis without current pathological fracture - VITAMIN D 25 Hydroxy (Vit-D Deficiency, Fractures) - DG Bone Density; Future  4. Screening for lipid disorders - Lipid panel  5. Adult BMI 27.0-27.9 kg/sq m - ALT - AST  6. Encounter for screening mammogram for malignant neoplasm of breast - MM DIGITAL SCREENING BILATERAL; Future   This visit occurred during the SARS-CoV-2 public health emergency.  Safety protocols were in place, including screening questions prior to the visit, additional usage of staff PPE, and extensive cleaning of exam room while observing appropriate contact time as indicated for disinfecting solutions.

## 2020-03-18 ENCOUNTER — Encounter: Payer: Self-pay | Admitting: Family Medicine

## 2020-03-20 ENCOUNTER — Encounter: Payer: Self-pay | Admitting: Family Medicine

## 2020-03-22 ENCOUNTER — Other Ambulatory Visit: Payer: Self-pay

## 2020-03-22 ENCOUNTER — Ambulatory Visit (INDEPENDENT_AMBULATORY_CARE_PROVIDER_SITE_OTHER): Payer: Medicare Other | Admitting: Family Medicine

## 2020-03-22 ENCOUNTER — Encounter: Payer: Self-pay | Admitting: Family Medicine

## 2020-03-22 VITALS — BP 144/78 | HR 60 | Temp 96.9°F | Ht 64.0 in | Wt 160.0 lb

## 2020-03-22 DIAGNOSIS — H5316 Psychophysical visual disturbances: Secondary | ICD-10-CM

## 2020-03-22 MED ORDER — DONEPEZIL HCL 5 MG PO TABS: 5.0000 mg | ORAL_TABLET | Freq: Every day | ORAL | 5 refills | Status: DC

## 2020-03-22 NOTE — Progress Notes (Signed)
Michelle [name] is a 79 y.o. female  Chief Complaint  Patient presents with  . Follow-up    med refills    HPI: Michelle [name] is a 79 y.o. female here with her husband, Michelle [name], to discuss medication options to help with her visual hallucinations related to her diagnosis of Michelle [name] (CBS) and macular degeneration. There is no known treatment for CBS. These hallucinations are understandably upsetting to that patient. Pt and husband wonder about medication to help with the hallucinations and the stress/anxiety they are causing her. Pt denies trouble sleeping. Pts ophthalmologist is Dr. Tempie Hoist.   Pts husband provided a list of meds that have been used in clinical trials for CBS and these include risperidone, donepezil, clonazepam among others (see mychart message).  Past Medical History:  Diagnosis Date  . Allergic rhinitis   . Cystocele   . Fibromuscular dysplasia of renal artery ([place])   . Hypertension    fibromuscular dysplasia  . Osteoarthritis   . Osteoporosis   . PMS (premenstrual [name])   . Retinal hemorrhage, right    with macular degeneration    Past Surgical History:  Procedure Laterality Date  . ANGIOPLASTY     right renal 1981  . BTL    . cb x 3    . HEMORRHOID SURGERY    . IR PTA RENAL VISCERAL  05/11/2017  . IR RADIOLOGIST EVAL & MGMT  04/21/2017  . IR RENAL BILAT S&I MOD SED  05/11/2017  . IR US GUIDE VASC ACCESS RIGHT  05/11/2017  . Rt ovarian cyst removed    . TONSILLECTOMY AND ADENOIDECTOMY      Social History   Socioeconomic History  . Marital status: Married    Spouse name: Not on file  . Number of children: Not on file  . Years of education: Not on file  . Highest education level: Not on file  Occupational History  . Not on file  Tobacco Use  . Smoking status: Former Research scientist (life sciences)  . Smokeless tobacco: Former Systems developer    Quit date: 10/27/1983  Substance and Sexual Activity  . Alcohol use: No  . Drug use: No  . Sexual  activity: Not on file  Other Topics Concern  . Not on file  Social History Narrative  . Not on file   Social Determinants of Health   Financial Resource Strain:   . Difficulty of Paying Living Expenses: Not on file  Food Insecurity:   . Worried About Charity fundraiser in the Last Year: Not on file  . Ran Out of Food in the Last Year: Not on file  Transportation Needs:   . Lack of Transportation (Medical): Not on file  . Lack of Transportation (Non-Medical): Not on file  Physical Activity:   . Days of Exercise per Week: Not on file  . Minutes of Exercise per Session: Not on file  Stress:   . Feeling of Stress : Not on file  Social Connections:   . Frequency of Communication with Friends and Family: Not on file  . Frequency of Social Gatherings with Friends and Family: Not on file  . Attends Religious Services: Not on file  . Active Member of Clubs or Organizations: Not on file  . Attends Archivist Meetings: Not on file  . Marital Status: Not on file  Intimate Partner Violence:   . Fear of Current or Ex-Partner: Not on file  . Emotionally Abused: Not on file  .  Physically Abused: Not on file  . Sexually Abused: Not on file    No family history on file.   Immunization History  Administered Date(s) Administered  . Fluad Quad(high Dose 65+) 04/05/2019  . Influenza Split 05/08/2013  . Influenza Whole 04/26/2009  . Influenza, High Dose Seasonal PF 05/21/2014  . PFIZER SARS-COV-2 Vaccination 08/17/2019, 09/07/2019  . Pneumococcal Conjugate-13 11/01/2013  . Pneumococcal Polysaccharide-23 07/28/2003, 10/23/2008  . Td 07/27/2002  . Tdap 01/23/2013  . Zoster 01/23/2013    Outpatient Encounter Medications as of 03/22/2020  Medication Sig Note  . aspirin EC 81 MG tablet Take 81 mg by mouth daily.   . beta carotene w/minerals (OCUVITE) tablet Take 1 tablet by mouth daily.   . Bevacizumab (AVASTIN) 100 MG/4ML SOLN Inject 1-10 mg into the eye See admin instructions.  For eyes One every 19 weeks 1/10 mg 05/04/2017: Next dose:06/27/17  . cetirizine (ZYRTEC) 10 MG tablet Take 10 mg by mouth daily as needed (for allergies.).    Marland Kitchen furosemide (LASIX) 20 MG tablet Take 10 mg by mouth daily.   Marland Kitchen lisinopril (PRINIVIL,ZESTRIL) 20 MG tablet Take 20 mg by mouth daily.   . naproxen sodium (ANAPROX) 220 MG tablet Take 440 mg by mouth at bedtime as needed (for pain.).   Marland Kitchen omeprazole (PRILOSEC) 20 MG capsule Take 20 mg by mouth daily before breakfast.   . Omega-3 1000 MG CAPS Take 1,000 mg by mouth daily. (Patient not taking: Reported on 03/15/2020)   . predniSONE (DELTASONE) 50 MG tablet Take 50 mg by mouth See admin instructions. Take 1 tablet (50 mg) by mouth at 2100, 0300, & 0900 before procedure. (Patient not taking: Reported on 03/15/2020) 05/04/2017: 2100, 0300, 0900   No facility-administered encounter medications on file as of 03/22/2020.     ROS: Pertinent positives and negatives noted in HPI. Remainder of ROS non-contributory    Allergies  Allergen Reactions  . Iodinated Diagnostic Agents Hives    Patient believes this was an older form of iodinated contrast.  Tolerated OMNIPAQUE IOHEXOL 11/19/14 for renal arteriogram/angioplasty without prep. [name], RN  . Diphenhydramine Hcl Hives    BP (!) 144/78   Pulse 60   Temp (!) 96.9 F (36.1 C) (Temporal)   Ht 5\' 4"  (1.626 m)   Wt 160 lb (72.6 kg)   SpO2 96%   BMI 27.46 kg/m   Physical Exam Constitutional:      General: She is not in acute distress.    Appearance: Normal appearance. She is not ill-appearing.  Neurological:     Mental Status: She is alert and oriented to person, place, and time.  Psychiatric:        Mood and Affect: Mood normal.        Behavior: Behavior normal.      A/P:  1. Michelle [name] - diagnosed by her ophthalmologist Michelle [name] - no known treatments - some literature recommends donepezil, risperidone, clonazepam  Rx: - donepezil (ARICEPT) 5 MG tablet; Take  1 tablet (5 mg total) by mouth at bedtime.  Dispense: 30 tablet; Refill: 5 - f/u in 4-6 wks or sooner PRN Discussed plan and reviewed medications with patient, including risks, benefits, and potential side effects. Pt expressed understand. All questions answered.  This visit occurred during the SARS-CoV-2 public health emergency.  Safety protocols were in place, including screening questions prior to the visit, additional usage of staff PPE, and extensive cleaning of exam room while observing appropriate contact time as indicated for disinfecting  solutions.

## 2020-03-28 ENCOUNTER — Other Ambulatory Visit: Payer: Self-pay | Admitting: Family Medicine

## 2020-03-28 DIAGNOSIS — Z1231 Encounter for screening mammogram for malignant neoplasm of breast: Secondary | ICD-10-CM

## 2020-04-29 DIAGNOSIS — N183 Chronic kidney disease, stage 3 unspecified: Secondary | ICD-10-CM | POA: Diagnosis not present

## 2020-05-15 ENCOUNTER — Telehealth: Payer: Self-pay | Admitting: Family Medicine

## 2020-05-15 DIAGNOSIS — E875 Hyperkalemia: Secondary | ICD-10-CM | POA: Diagnosis not present

## 2020-05-15 DIAGNOSIS — N183 Chronic kidney disease, stage 3 unspecified: Secondary | ICD-10-CM | POA: Diagnosis not present

## 2020-05-15 DIAGNOSIS — E669 Obesity, unspecified: Secondary | ICD-10-CM | POA: Diagnosis not present

## 2020-05-15 DIAGNOSIS — I129 Hypertensive chronic kidney disease with stage 1 through stage 4 chronic kidney disease, or unspecified chronic kidney disease: Secondary | ICD-10-CM | POA: Diagnosis not present

## 2020-05-15 DIAGNOSIS — H353290 Exudative age-related macular degeneration, unspecified eye, stage unspecified: Secondary | ICD-10-CM | POA: Diagnosis not present

## 2020-05-15 DIAGNOSIS — I7789 Other specified disorders of arteries and arterioles: Secondary | ICD-10-CM | POA: Diagnosis not present

## 2020-05-15 DIAGNOSIS — R739 Hyperglycemia, unspecified: Secondary | ICD-10-CM | POA: Diagnosis not present

## 2020-05-15 NOTE — Telephone Encounter (Signed)
Left message for patient to schedule Annual Wellness Visit.  Please schedule with Nurse Health Advisor Martha Stanley, RN at [facility]  °

## 2020-05-16 ENCOUNTER — Encounter: Payer: Self-pay | Admitting: Family Medicine

## 2020-05-21 ENCOUNTER — Other Ambulatory Visit: Payer: Self-pay

## 2020-05-21 ENCOUNTER — Ambulatory Visit (INDEPENDENT_AMBULATORY_CARE_PROVIDER_SITE_OTHER): Payer: Medicare Other

## 2020-05-21 VITALS — BP 128/64 | HR 67 | Temp 96.9°F | Resp 16 | Ht 64.0 in | Wt 158.4 lb

## 2020-05-21 DIAGNOSIS — Z0000 Encounter for general adult medical examination without abnormal findings: Secondary | ICD-10-CM | POA: Diagnosis not present

## 2020-05-21 NOTE — Patient Instructions (Signed)
Michelle [name] , Thank you for taking time to come for your Medicare Wellness Visit. I appreciate your ongoing commitment to your health goals. Please review the following plan we discussed and let me know if I can assist you in the future.   Screening recommendations/referrals: Colonoscopy: No longer required Mammogram: Scheduled for 07/15/2020 Bone Density: Scheduled for 07/15/2020 Recommended yearly ophthalmology/optometry visit for glaucoma screening and checkup Recommended yearly dental visit for hygiene and checkup  Vaccinations: Influenza vaccine: Up to Date Pneumococcal vaccine: Completed vaccines Tdap vaccine: Up to Date- Due-01/14/2023 Shingles vaccine: Discuss with pharmacy  Covid-19:Completed vaccines  Advanced directives:  Please bring a copy for your chart if avialable.  Conditions/risks identified: See problem list  Next appointment: Follow up in one year for your annual wellness visit 05/27/2021 @ 9:45am.   Preventive Care 65 Years and Older, Female Preventive care refers to lifestyle choices and visits with your health care provider that can promote health and wellness. What does preventive care include?  A yearly physical exam. This is also called an annual well check.  Dental exams once or twice a year.  Routine eye exams. Ask your health care provider how often you should have your eyes checked.  Personal lifestyle choices, including:  Daily care of your teeth and gums.  Regular physical activity.  Eating a healthy diet.  Avoiding tobacco and drug use.  Limiting alcohol use.  Practicing safe sex.  Taking low-dose aspirin every day.  Taking vitamin and mineral supplements as recommended by your health care provider. What happens during an annual well check? The services and screenings done by your health care provider during your annual well check will depend on your age, overall health, lifestyle risk factors, and family history of disease. Counseling    Your health care provider may ask you questions about your:  Alcohol use.  Tobacco use.  Drug use.  Emotional well-being.  Home and relationship well-being.  Sexual activity.  Eating habits.  History of falls.  Memory and ability to understand (cognition).  Work and work Statistician.  Reproductive health. Screening  You may have the following tests or measurements:  Height, weight, and BMI.  Blood pressure.  Lipid and cholesterol levels. These may be checked every 5 years, or more frequently if you are over 38 years old.  Skin check.  Lung cancer screening. You may have this screening every year starting at age 26 if you have a 30-pack-year history of smoking and currently smoke or have quit within the past 15 years.  Fecal occult blood test (FOBT) of the stool. You may have this test every year starting at age 81.  Flexible sigmoidoscopy or colonoscopy. You may have a sigmoidoscopy every 5 years or a colonoscopy every 10 years starting at age 81.  Hepatitis C blood test.  Hepatitis B blood test.  Sexually transmitted disease (STD) testing.  Diabetes screening. This is done by checking your blood sugar (glucose) after you have not eaten for a while (fasting). You may have this done every 1-3 years.  Bone density scan. This is done to screen for osteoporosis. You may have this done starting at age 81.  Mammogram. This may be done every 1-2 years. Talk to your health care provider about how often you should have regular mammograms. Talk with your health care provider about your test results, treatment options, and if necessary, the need for more tests. Vaccines  Your health care provider may recommend certain vaccines, such as:  Influenza vaccine. This  is recommended every year.  Tetanus, diphtheria, and acellular pertussis (Tdap, Td) vaccine. You may need a Td booster every 10 years.  Zoster vaccine. You may need this after age 81.  Pneumococcal 13-valent  conjugate (PCV13) vaccine. One dose is recommended after age 81.  Pneumococcal polysaccharide (PPSV23) vaccine. One dose is recommended after age 81. Talk to your health care provider about which screenings and vaccines you need and how often you need them. This information is not intended to replace advice given to you by your health care provider. Make sure you discuss any questions you have with your health care provider. Document Released: 08/09/2015 Document Revised: 04/01/2016 Document Reviewed: 05/14/2015 Elsevier Interactive Patient Education  2017 [place] Prevention in the Home Falls can cause injuries. They can happen to people of all ages. There are many things you can do to make your home safe and to help prevent falls. What can I do on the outside of my home?  Regularly fix the edges of walkways and driveways and fix any cracks.  Remove anything that might make you trip as you walk through a door, such as a raised step or threshold.  Trim any bushes or trees on the path to your home.  Use bright outdoor lighting.  Clear any walking paths of anything that might make someone trip, such as rocks or tools.  Regularly check to see if handrails are loose or broken. Make sure that both sides of any steps have handrails.  Any raised decks and porches should have guardrails on the edges.  Have any leaves, snow, or ice cleared regularly.  Use sand or salt on walking paths during winter.  Clean up any spills in your garage right away. This includes oil or grease spills. What can I do in the bathroom?  Use night lights.  Install grab bars by the toilet and in the tub and shower. Do not use towel bars as grab bars.  Use non-skid mats or decals in the tub or shower.  If you need to sit down in the shower, use a plastic, non-slip stool.  Keep the floor dry. Clean up any water that spills on the floor as soon as it happens.  Remove soap buildup in the tub or  shower regularly.  Attach bath mats securely with double-sided non-slip rug tape.  Do not have throw rugs and other things on the floor that can make you trip. What can I do in the bedroom?  Use night lights.  Make sure that you have a light by your bed that is easy to reach.  Do not use any sheets or blankets that are too big for your bed. They should not hang down onto the floor.  Have a firm chair that has side arms. You can use this for support while you get dressed.  Do not have throw rugs and other things on the floor that can make you trip. What can I do in the kitchen?  Clean up any spills right away.  Avoid walking on wet floors.  Keep items that you use a lot in easy-to-reach places.  If you need to reach something above you, use a strong step stool that has a grab bar.  Keep electrical cords out of the way.  Do not use floor polish or wax that makes floors slippery. If you must use wax, use non-skid floor wax.  Do not have throw rugs and other things on the floor that can make you  trip. What can I do with my stairs?  Do not leave any items on the stairs.  Make sure that there are handrails on both sides of the stairs and use them. Fix handrails that are broken or loose. Make sure that handrails are as long as the stairways.  Check any carpeting to make sure that it is firmly attached to the stairs. Fix any carpet that is loose or worn.  Avoid having throw rugs at the top or bottom of the stairs. If you do have throw rugs, attach them to the floor with carpet tape.  Make sure that you have a light switch at the top of the stairs and the bottom of the stairs. If you do not have them, ask someone to add them for you. What else can I do to help prevent falls?  Wear shoes that:  Do not have high heels.  Have rubber bottoms.  Are comfortable and fit you well.  Are closed at the toe. Do not wear sandals.  If you use a stepladder:  Make sure that it is fully  opened. Do not climb a closed stepladder.  Make sure that both sides of the stepladder are locked into place.  Ask someone to hold it for you, if possible.  Clearly mark and make sure that you can see:  Any grab bars or handrails.  First and last steps.  Where the edge of each step is.  Use tools that help you move around (mobility aids) if they are needed. These include:  Canes.  Walkers.  Scooters.  Crutches.  Turn on the lights when you go into a dark area. Replace any light bulbs as soon as they burn out.  Set up your furniture so you have a clear path. Avoid moving your furniture around.  If any of your floors are uneven, fix them.  If there are any pets around you, be aware of where they are.  Review your medicines with your doctor. Some medicines can make you feel dizzy. This can increase your chance of falling. Ask your doctor what other things that you can do to help prevent falls. This information is not intended to replace advice given to you by your health care provider. Make sure you discuss any questions you have with your health care provider. Document Released: 05/09/2009 Document Revised: 12/19/2015 Document Reviewed: 08/17/2014 Elsevier Interactive Patient Education  2017 [employer].

## 2020-05-21 NOTE — Progress Notes (Signed)
Subjective:   KAYE LUOMA is a 79 y.o. female who presents for an Initial Medicare Annual Wellness Visit.  Review of Systems     Cardiac Risk Factors include: advanced age (>57men, >46 women);hypertension     Objective:    Today's Vitals   05/21/20 0856  BP: 128/64  Pulse: 67  Resp: 16  Temp: (!) 96.9 F (36.1 C)  TempSrc: Temporal  SpO2: 98%  Weight: 158 lb 6.4 oz (71.8 kg)  Height: 5\' 4"  (1.626 m)   Body mass index is 27.19 kg/m.  Advanced Directives 05/21/2020 05/11/2017 11/19/2014 10/15/2014  Does Patient Have a Medical Advance Directive? Yes Yes Yes Yes  Type of Paramedic of [place];Living will [place];Living will [place];Living will [place];Living will  Does patient want to make changes to medical advance directive? - - No - Patient declined No - Patient declined  Copy of [place] in Chart? No - copy requested No - copy requested No - copy requested No - copy requested    Current Medications (verified) Outpatient Encounter Medications as of 05/21/2020  Medication Sig  . aspirin EC 81 MG tablet Take 81 mg by mouth daily.  . beta carotene w/minerals (OCUVITE) tablet Take 1 tablet by mouth daily.  . Bevacizumab (AVASTIN) 100 MG/4ML SOLN Inject 1-10 mg into the eye See admin instructions. For eyes One every 19 weeks 1/10 mg  . cetirizine (ZYRTEC) 10 MG tablet Take 10 mg by mouth daily as needed (for allergies.).   Marland Kitchen furosemide (LASIX) 20 MG tablet Take 10 mg by mouth daily.  Marland Kitchen lisinopril (PRINIVIL,ZESTRIL) 20 MG tablet Take 20 mg by mouth daily.  . naproxen sodium (ANAPROX) 220 MG tablet Take 440 mg by mouth at bedtime as needed (for pain.).  Marland Kitchen omeprazole (PRILOSEC) 20 MG capsule Take 20 mg by mouth daily before breakfast.  . donepezil (ARICEPT) 5 MG tablet Take 1 tablet (5 mg total) by mouth at bedtime. (Patient not taking: Reported on 05/21/2020)  .  [DISCONTINUED] Omega-3 1000 MG CAPS Take 1,000 mg by mouth daily. (Patient not taking: Reported on 03/15/2020)  . [DISCONTINUED] predniSONE (DELTASONE) 50 MG tablet Take 50 mg by mouth See admin instructions. Take 1 tablet (50 mg) by mouth at 2100, 0300, & 0900 before procedure. (Patient not taking: Reported on 03/15/2020)   No facility-administered encounter medications on file as of 05/21/2020.    Allergies (verified) Iodinated diagnostic agents and Diphenhydramine hcl   History: Past Medical History:  Diagnosis Date  . Allergic rhinitis   . Cystocele   . Fibromuscular dysplasia of renal artery ([place])   . Hypertension    fibromuscular dysplasia  . Osteoarthritis   . Osteoporosis   . PMS (premenstrual syndrome)   . Retinal hemorrhage, right    with macular degeneration   Past Surgical History:  Procedure Laterality Date  . ANGIOPLASTY     right renal 1981  . BTL    . cb x 3    . HEMORRHOID SURGERY    . IR PTA RENAL VISCERAL  05/11/2017  . IR RADIOLOGIST EVAL & MGMT  04/21/2017  . IR RENAL BILAT S&I MOD SED  05/11/2017  . IR US GUIDE VASC ACCESS RIGHT  05/11/2017  . Rt ovarian cyst removed    . TONSILLECTOMY AND ADENOIDECTOMY     History reviewed. No pertinent family history. Social History   Socioeconomic History  . Marital status: Married  Spouse name: Not on file  . Number of children: Not on file  . Years of education: Not on file  . Highest education level: Not on file  Occupational History  . Not on file  Tobacco Use  . Smoking status: Former Research scientist (life sciences)  . Smokeless tobacco: Former Systems developer    Quit date: 10/27/1983  Substance and Sexual Activity  . Alcohol use: No  . Drug use: No  . Sexual activity: Not on file  Other Topics Concern  . Not on file  Social History Narrative  . Not on file   Social Determinants of Health   Financial Resource Strain: Low Risk   . Difficulty of Paying Living Expenses: Not hard at all  Food Insecurity: No Food Insecurity  .  Worried About Charity fundraiser in the Last Year: Never true  . Ran Out of Food in the Last Year: Never true  Transportation Needs: No Transportation Needs  . Lack of Transportation (Medical): No  . Lack of Transportation (Non-Medical): No  Physical Activity: Sufficiently Active  . Days of Exercise per Week: 7 days  . Minutes of Exercise per Session: 30 min  Stress: No Stress Concern Present  . Feeling of Stress : Not at all  Social Connections: Moderately Isolated  . Frequency of Communication with Friends and Family: More than three times a week  . Frequency of Social Gatherings with Friends and Family: Once a week  . Attends Religious Services: Never  . Active Member of Clubs or Organizations: No  . Attends Archivist Meetings: Never  . Marital Status: Married    Tobacco Counseling Counseling given: Not Answered   Clinical Intake:  Pre-visit preparation completed: Yes  Pain : No/denies pain     Nutritional Status: BMI 25 -29 Overweight Nutritional Risks: None Diabetes: No  How often do you need to have someone help you when you read instructions, pamphlets, or other written materials from your doctor or pharmacy?: 1 - Never What is the last grade level you completed in school?: 12th grade  Diabetic?No  Interpreter Needed?: No  Information entered by :: [name] Hamman LPN   Activities of Daily Living In your present state of health, do you have any difficulty performing the following activities: 05/21/2020  Hearing? N  Vision? N  Difficulty concentrating or making decisions? N  Walking or climbing stairs? N  Dressing or bathing? N  Doing errands, shopping? N  Preparing Food and eating ? N  Using the Toilet? N  In the past six months, have you accidently leaked urine? N  Do you have problems with loss of bowel control? N  Managing your Medications? N  Managing your Finances? N  Housekeeping or managing your Housekeeping? N  Some recent data  might be hidden    Patient Care Team: Ronnald Nian, DO as PCP - General (Family Medicine)  Indicate any recent Medical Services you may have received from other than [facility] providers in the past year (date may be approximate).     Assessment:   This is a routine wellness examination for Konica.  Hearing/Vision screen  Hearing Screening   125Hz  250Hz  500Hz  1000Hz  2000Hz  3000Hz  4000Hz  6000Hz  8000Hz   Right ear:           Left ear:           Comments: No issues  Vision Screening Comments: Last eye exam-03/2020-Dr. Zigmund Daniel Macular degeneration  Dietary issues and exercise activities discussed: Current Exercise Habits: Home exercise routine,  Type of exercise: walking, Time (Minutes): 30, Frequency (Times/Week): 7, Weekly Exercise (Minutes/Week): 210, Intensity: Mild  Goals    . Patient Stated     Increase activity      Depression Screen PHQ 2/9 Scores 05/21/2020 03/15/2020 08/11/2018 11/01/2013  PHQ - 2 Score 0 0 0 0    Fall Risk Fall Risk  05/21/2020 03/15/2020 08/11/2018 11/01/2013  Falls in the past year? 0 0 0 No  Number falls in past yr: 0 0 - -  Injury with Fall? 0 0 - -  Follow up Falls prevention discussed - - -    Any stairs in or around the home? Yes  If so, are there any without handrails? No  Home free of loose throw rugs in walkways, pet beds, electrical cords, etc? Yes  Adequate lighting in your home to reduce risk of falls? Yes   ASSISTIVE DEVICES UTILIZED TO PREVENT FALLS:  Life alert? No  Use of a cane, walker or w/c? Yes  Grab bars in the bathroom? Yes  Shower chair or bench in shower? No  Elevated toilet seat or a handicapped toilet? No   TIMED UP AND GO:  Was the test performed? Yes .  Length of time to ambulate 10 feet: 10 sec.   Gait steady and fast with assistive device  Cognitive Function:     6CIT Screen 05/21/2020  What Year? 4 points  What month? 0 points  What time? 0 points  Count back from 20 0 points  Months in reverse 0  points  Repeat phrase 0 points  Total Score 4    Immunizations Immunization History  Administered Date(s) Administered  . Fluad Quad(high Dose 65+) 04/05/2019  . Influenza Split 05/08/2013  . Influenza Whole 04/26/2009  . Influenza, High Dose Seasonal PF 05/21/2014  . Influenza-Unspecified 05/07/2020  . PFIZER SARS-COV-2 Vaccination 08/17/2019, 09/07/2019, 05/07/2020  . Pneumococcal Conjugate-13 11/01/2013  . Pneumococcal Polysaccharide-23 07/28/2003, 10/23/2008  . Td 07/27/2002  . Tdap 01/23/2013  . Zoster 01/23/2013    TDAP status: Up to date   Flu Vaccine status: Up to date   Pneumococcal vaccine status: Up to date   Covid-19 vaccine status: Completed vaccines  Qualifies for Shingles Vaccine? Yes   Zostavax completed Yes   Shingrix Completed?: No.    Education has been provided regarding the importance of this vaccine. Patient has been advised to call insurance company to determine out of pocket expense if they have not yet received this vaccine. Advised may also receive vaccine at local pharmacy or Health Dept. Verbalized acceptance and understanding.  Screening Tests Health Maintenance  Topic Date Due  . DEXA SCAN  Never done  . TETANUS/TDAP  01/24/2023  . INFLUENZA VACCINE  Completed  . COVID-19 Vaccine  Completed  . PNA vac Low Risk Adult  Completed    Health Maintenance  Health Maintenance Due  Topic Date Due  . DEXA SCAN  Never done    Colorectal cancer screening: No longer required.    Mammogram status: Scheduled for 07/15/20  Bone Density status: Scheduled for 07/15/20  Lung Cancer Screening: (Low Dose CT Chest recommended if Age 80-80 years, 30 pack-year currently smoking OR have quit w/in 15years.) does not qualify.     Additional Screening:  Hepatitis C Screening: does not qualify  Vision Screening: Recommended annual ophthalmology exams for early detection of glaucoma and other disorders of the eye. Is the patient up to date with their  annual eye exam?  Yes  Who is  the provider or what is the name of the office in which the patient attends annual eye exams? Dr. Zigmund Daniel   Dental Screening: Recommended annual dental exams for proper oral hygiene  Community Resource Referral / Chronic Care Management: CRR required this visit?  No   CCM required this visit?  No      Plan:     I have personally reviewed and noted the following in the patient's chart:   . Medical and social history . Use of alcohol, tobacco or illicit drugs  . Current medications and supplements . Functional ability and status . Nutritional status . Physical activity . Advanced directives . List of other physicians . Hospitalizations, surgeries, and ER visits in previous 12 months . Vitals . Screenings to include cognitive, depression, and falls . Referrals and appointments  In addition, I have reviewed and discussed with patient certain preventive protocols, quality metrics, and best practice recommendations. A written personalized care plan for preventive services as well as general preventive health recommendations were provided to patient.   Patient to access AVS via mychart.  Marta Antu, LPN   [id number]  Nurse Health Advisor  Nurse Notes: None

## 2020-06-28 ENCOUNTER — Encounter: Payer: Self-pay | Admitting: Family Medicine

## 2020-07-15 ENCOUNTER — Ambulatory Visit: Payer: Medicare Other

## 2020-07-15 ENCOUNTER — Other Ambulatory Visit: Payer: Medicare Other

## 2020-07-28 ENCOUNTER — Encounter: Payer: Self-pay | Admitting: Family Medicine

## 2020-08-01 ENCOUNTER — Other Ambulatory Visit: Payer: Self-pay

## 2020-08-02 ENCOUNTER — Encounter: Payer: Self-pay | Admitting: Family Medicine

## 2020-08-02 ENCOUNTER — Ambulatory Visit (INDEPENDENT_AMBULATORY_CARE_PROVIDER_SITE_OTHER): Payer: Medicare Other | Admitting: Family Medicine

## 2020-08-02 VITALS — BP 118/72 | HR 71 | Temp 98.1°F | Ht 64.0 in | Wt 155.8 lb

## 2020-08-02 DIAGNOSIS — F418 Other specified anxiety disorders: Secondary | ICD-10-CM

## 2020-08-02 DIAGNOSIS — I10 Essential (primary) hypertension: Secondary | ICD-10-CM | POA: Diagnosis not present

## 2020-08-02 DIAGNOSIS — M810 Age-related osteoporosis without current pathological fracture: Secondary | ICD-10-CM | POA: Diagnosis not present

## 2020-08-02 DIAGNOSIS — H3530 Unspecified macular degeneration: Secondary | ICD-10-CM | POA: Diagnosis not present

## 2020-08-02 DIAGNOSIS — H5316 Psychophysical visual disturbances: Secondary | ICD-10-CM | POA: Diagnosis not present

## 2020-08-02 MED ORDER — CLONAZEPAM 0.5 MG PO TABS: ORAL_TABLET | ORAL | 0 refills | Status: DC

## 2020-08-02 NOTE — Progress Notes (Signed)
Michelle [name] is a 80 y.o. female  Chief Complaint  Patient presents with  . Follow-up    F/u     HPI: Michelle [name] is a 80 y.o. 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  Dexa and mammo scheduled for 08/2020. She is not taking a calcium and Vit D supplement at home. For HTN, pt is on lisinopril 20mg  daily.  Pt was dx with CBS about 1 year ago. She has visual hallucinations of people and more recently of "conveyors taking things out of the house". She is followed with ophthalmology- Dr. Zigmund Daniel. She is not particularly bothered by these, but does admit to sometimes feeling anxious more so in late afternoon and early evening. She does have trouble sleeping. She has a reader, walking cane, watch which have enabled her to do things she wasn't able to do in the past year.   Pts husband provided a list of meds that have been used in clinical trials for CBS and these include risperidone, donepezil, clonazepam among others (see mychart message). In 02/2020, we tried pt on donepezil but it was not very effective. She is taking some OTC supplements - St. John's Wort for anxiety. She would be agreeable to low dose of PRN med for anxiety.   Lab Results  Component Value Date   NA 140 03/15/2020   K 4.0 03/15/2020   CREATININE 1.25 (H) 03/15/2020   GFRNONAA 39 (L) 05/11/2017   GFRAA 46 (L) 05/11/2017   GLUCOSE 119 (H) 03/15/2020   Lab Results  Component Value Date   CHOL 174 03/15/2020   HDL 51.30 03/15/2020   LDLCALC 95 03/15/2020   LDLDIRECT 106.0 10/27/2010   TRIG 141.0 03/15/2020   CHOLHDL 3 03/15/2020   Last vitamin D Lab Results  Component Value Date   VD25OH 26.15 (L) 03/15/2020   Lab Results  Component Value Date   WBC 6.0 05/11/2017   HGB 13.5 05/11/2017   HCT 38.6 05/11/2017   MCV 87.7 05/11/2017   PLT 178 05/11/2017     Past Medical History:  Diagnosis Date  .  Allergic rhinitis   . Cystocele   . Fibromuscular dysplasia of renal artery ([place])   . Hypertension    fibromuscular dysplasia  . Osteoarthritis   . Osteoporosis   . PMS (premenstrual syndrome)   . Retinal hemorrhage, right    with macular degeneration    Past Surgical History:  Procedure Laterality Date  . ANGIOPLASTY     right renal 1981  . BTL    . cb x 3    . HEMORRHOID SURGERY    . IR PTA RENAL VISCERAL  05/11/2017  . IR RADIOLOGIST EVAL & MGMT  04/21/2017  . IR RENAL BILAT S&I MOD SED  05/11/2017  . IR US GUIDE VASC ACCESS RIGHT  05/11/2017  . Rt ovarian cyst removed    . TONSILLECTOMY AND ADENOIDECTOMY      Social History   Socioeconomic History  . Marital status: Married    Spouse name: Not on file  . Number of children: Not on file  . Years of education: Not on file  . Highest education level: Not on file  Occupational History  . Not on file  Tobacco Use  . Smoking status: Former Research scientist (life sciences)  . Smokeless tobacco: Former Systems developer    Quit date: 10/27/1983  Substance and Sexual Activity  . Alcohol use: No  .  Drug use: No  . Sexual activity: Not on file  Other Topics Concern  . Not on file  Social History Narrative  . Not on file   Social Determinants of Health   Financial Resource Strain: Low Risk   . Difficulty of Paying Living Expenses: Not hard at all  Food Insecurity: No Food Insecurity  . Worried About Charity fundraiser in the Last Year: Never true  . Ran Out of Food in the Last Year: Never true  Transportation Needs: No Transportation Needs  . Lack of Transportation (Medical): No  . Lack of Transportation (Non-Medical): No  Physical Activity: Sufficiently Active  . Days of Exercise per Week: 7 days  . Minutes of Exercise per Session: 30 min  Stress: No Stress Concern Present  . Feeling of Stress : Not at all  Social Connections: Moderately Isolated  . Frequency of Communication with Friends and Family: More than three times a week  . Frequency of  Social Gatherings with Friends and Family: Once a week  . Attends Religious Services: Never  . Active Member of Clubs or Organizations: No  . Attends Archivist Meetings: Never  . Marital Status: Married  Human resources officer Violence: Not At Risk  . Fear of Current or Ex-Partner: No  . Emotionally Abused: No  . Physically Abused: No  . Sexually Abused: No    History reviewed. No pertinent family history.   Immunization History  Administered Date(s) Administered  . Fluad Quad(high Dose 65+) 04/05/2019  . Influenza Split 05/08/2013  . Influenza Whole 04/26/2009  . Influenza, High Dose Seasonal PF 05/21/2014  . Influenza-Unspecified 05/07/2020  . PFIZER SARS-COV-2 Vaccination 08/17/2019, 09/07/2019, 05/07/2020  . Pneumococcal Conjugate-13 11/01/2013  . Pneumococcal Polysaccharide-23 07/28/2003, 10/23/2008  . Td 07/27/2002  . Tdap 01/23/2013  . Zoster 01/23/2013    Outpatient Encounter Medications as of 08/02/2020  Medication Sig Note  . aspirin EC 81 MG tablet Take 81 mg by mouth daily.   . beta carotene w/minerals (OCUVITE) tablet Take 1 tablet by mouth daily.   . Bevacizumab (AVASTIN) 100 MG/4ML SOLN Inject 1-10 mg into the eye See admin instructions. For eyes One every 19 weeks 1/10 mg 05/04/2017: Next dose:06/27/17  . cetirizine (ZYRTEC) 10 MG tablet Take 10 mg by mouth daily as needed (for allergies.).   Marland Kitchen furosemide (LASIX) 20 MG tablet Take 10 mg by mouth daily.   Marland Kitchen lisinopril (PRINIVIL,ZESTRIL) 20 MG tablet Take 20 mg by mouth daily.   . naproxen sodium (ANAPROX) 220 MG tablet Take 440 mg by mouth at bedtime as needed (for pain.).   Marland Kitchen omeprazole (PRILOSEC) 20 MG capsule Take 20 mg by mouth daily before breakfast.   . [DISCONTINUED] donepezil (ARICEPT) 5 MG tablet Take 1 tablet (5 mg total) by mouth at bedtime. (Patient not taking: No sig reported)    No facility-administered encounter medications on file as of 08/02/2020.     ROS: Pertinent positives and  negatives noted in HPI. Remainder of ROS non-contributory    Allergies  Allergen Reactions  . Iodinated Diagnostic Agents Hives    Patient believes this was an older form of iodinated contrast.  Tolerated OMNIPAQUE IOHEXOL 11/19/14 for renal arteriogram/angioplasty without prep. [name], RN  . H[place]   . Diphenhydramine Hcl Hives    BP 118/72   Pulse 71   Temp 98.1 F (36.7 C) (Temporal)   Ht 5\' 4"  (1.626 m)   Wt 155 lb 12.8 oz (70.7 kg)   SpO2 98%  BMI 26.74 kg/m    BP Readings from Last 3 Encounters:  08/02/20 118/72  05/21/20 128/64  03/22/20 (!) 144/78   Pulse Readings from Last 3 Encounters:  08/02/20 71  05/21/20 67  03/22/20 60   Wt Readings from Last 3 Encounters:  08/02/20 155 lb 12.8 oz (70.7 kg)  05/21/20 158 lb 6.4 oz (71.8 kg)  03/22/20 160 lb (72.6 kg)   Physical Exam Constitutional:      General: She is not in acute distress.    Appearance: Normal appearance. She is not ill-appearing.  Cardiovascular:     Rate and Rhythm: Regular rhythm.     Pulses: Normal pulses.  Pulmonary:     Effort: Pulmonary effort is normal. No respiratory distress.     Breath sounds: Normal breath sounds. No wheezing or rhonchi.  Musculoskeletal:     Right lower leg: No edema.     Left lower leg: No edema.  Neurological:     Mental Status: She is alert and oriented to person, place, and time.  Psychiatric:        Mood and Affect: Mood normal.        Behavior: Behavior normal.      A/P:  1. Macular degeneration of both eyes, unspecified type 2. [name] 3. Anxiety about health - follows with Dr. Zigmund Daniel - pt endorses some anxiety, no improvement with aricept, and would like to try PRN med to help Rx: - clonazepam 0.5mg  1/2 to 1 tab po BID PRN  4. Age-related osteoporosis without current pathological fracture - Dexa scheduled in 08/2020 - pt to restart calcium and Vit D  4. Essential hypertension, benign - controlled, at goal - cont  lisinopril 20mg  daily  - pt to f/u in 6 mo or sooner PRN  This visit occurred during the SARS-CoV-2 public health emergency.  Safety protocols were in place, including screening questions prior to the visit, additional usage of staff PPE, and extensive cleaning of exam room while observing appropriate contact time as indicated for disinfecting solutions.

## 2020-08-02 NOTE — Patient Instructions (Addendum)
You should be taking daily calcium 1200mg  and daily Vit D 1000IU supplements  For anxiety, take clonazepam 1/2 to 1 tab up to 2x/day as needed

## 2020-09-04 ENCOUNTER — Other Ambulatory Visit: Payer: Self-pay

## 2020-09-04 ENCOUNTER — Encounter (INDEPENDENT_AMBULATORY_CARE_PROVIDER_SITE_OTHER): Payer: Medicare HMO | Admitting: Ophthalmology

## 2020-09-04 DIAGNOSIS — H353134 Nonexudative age-related macular degeneration, bilateral, advanced atrophic with subfoveal involvement: Secondary | ICD-10-CM

## 2020-09-04 DIAGNOSIS — H43813 Vitreous degeneration, bilateral: Secondary | ICD-10-CM

## 2020-09-04 DIAGNOSIS — I10 Essential (primary) hypertension: Secondary | ICD-10-CM

## 2020-09-04 DIAGNOSIS — H35033 Hypertensive retinopathy, bilateral: Secondary | ICD-10-CM | POA: Diagnosis not present

## 2020-09-05 ENCOUNTER — Ambulatory Visit
Admission: RE | Admit: 2020-09-05 | Discharge: 2020-09-05 | Disposition: A | Discharge status: Home / Self Care | Payer: Medicare Other | Source: Ambulatory Visit | Attending: Family Medicine | Admitting: Family Medicine

## 2020-09-05 ENCOUNTER — Ambulatory Visit
Admission: RE | Admit: 2020-09-05 | Discharge: 2020-09-05 | Disposition: A | Discharge status: Home / Self Care | Payer: Medicare HMO | Source: Ambulatory Visit | Attending: Family Medicine | Admitting: Family Medicine

## 2020-09-05 ENCOUNTER — Encounter (INDEPENDENT_AMBULATORY_CARE_PROVIDER_SITE_OTHER): Payer: Medicare Other | Admitting: Ophthalmology

## 2020-09-05 DIAGNOSIS — M810 Age-related osteoporosis without current pathological fracture: Secondary | ICD-10-CM

## 2020-09-05 DIAGNOSIS — Z1231 Encounter for screening mammogram for malignant neoplasm of breast: Secondary | ICD-10-CM

## 2020-09-10 ENCOUNTER — Encounter: Payer: Self-pay | Admitting: Family Medicine

## 2020-09-10 DIAGNOSIS — F418 Other specified anxiety disorders: Secondary | ICD-10-CM

## 2020-09-10 DIAGNOSIS — H5316 Psychophysical visual disturbances: Secondary | ICD-10-CM

## 2020-09-10 DIAGNOSIS — H3530 Unspecified macular degeneration: Secondary | ICD-10-CM

## 2020-09-11 ENCOUNTER — Other Ambulatory Visit: Payer: Self-pay | Admitting: Family Medicine

## 2020-09-11 DIAGNOSIS — H3530 Unspecified macular degeneration: Secondary | ICD-10-CM

## 2020-09-11 DIAGNOSIS — H5316 Psychophysical visual disturbances: Secondary | ICD-10-CM

## 2020-09-11 DIAGNOSIS — F418 Other specified anxiety disorders: Secondary | ICD-10-CM

## 2020-09-11 MED ORDER — CLONAZEPAM 0.5 MG PO TABS
0.5000 mg | ORAL_TABLET | Freq: Two times a day (BID) | ORAL | 2 refills | Status: DC | PRN
Start: 2020-09-11 — End: 2020-12-20

## 2020-09-11 MED ORDER — CLONAZEPAM 0.5 MG PO TABS: 0.5000 mg | ORAL_TABLET | Freq: Two times a day (BID) | ORAL | 2 refills | Status: DC | PRN

## 2020-11-30 ENCOUNTER — Emergency Department (HOSPITAL_COMMUNITY)
Admission: EM | Admit: 2020-11-30 | Discharge: 2020-11-30 | Disposition: A | Discharge status: Home / Self Care | Payer: Medicare HMO | Source: Home / Self Care | Attending: Emergency Medicine | Admitting: Emergency Medicine

## 2020-11-30 ENCOUNTER — Encounter (HOSPITAL_COMMUNITY): Payer: Self-pay

## 2020-11-30 ENCOUNTER — Other Ambulatory Visit: Payer: Self-pay

## 2020-11-30 ENCOUNTER — Emergency Department (HOSPITAL_COMMUNITY): Payer: Medicare HMO

## 2020-11-30 DIAGNOSIS — I951 Orthostatic hypotension: Secondary | ICD-10-CM

## 2020-11-30 DIAGNOSIS — W01198A Fall on same level from slipping, tripping and stumbling with subsequent striking against other object, initial encounter: Secondary | ICD-10-CM | POA: Insufficient documentation

## 2020-11-30 DIAGNOSIS — I10 Essential (primary) hypertension: Secondary | ICD-10-CM | POA: Insufficient documentation

## 2020-11-30 DIAGNOSIS — Z7982 Long term (current) use of aspirin: Secondary | ICD-10-CM | POA: Insufficient documentation

## 2020-11-30 DIAGNOSIS — Z85828 Personal history of other malignant neoplasm of skin: Secondary | ICD-10-CM | POA: Insufficient documentation

## 2020-11-30 DIAGNOSIS — Z20822 Contact with and (suspected) exposure to covid-19: Secondary | ICD-10-CM | POA: Insufficient documentation

## 2020-11-30 DIAGNOSIS — B9689 Other specified bacterial agents as the cause of diseases classified elsewhere: Secondary | ICD-10-CM | POA: Insufficient documentation

## 2020-11-30 DIAGNOSIS — N3000 Acute cystitis without hematuria: Secondary | ICD-10-CM

## 2020-11-30 DIAGNOSIS — R930 Abnormal findings on diagnostic imaging of skull and head, not elsewhere classified: Secondary | ICD-10-CM | POA: Insufficient documentation

## 2020-11-30 DIAGNOSIS — Z79899 Other long term (current) drug therapy: Secondary | ICD-10-CM | POA: Insufficient documentation

## 2020-11-30 DIAGNOSIS — Z87891 Personal history of nicotine dependence: Secondary | ICD-10-CM | POA: Insufficient documentation

## 2020-11-30 DIAGNOSIS — R7881 Bacteremia: Secondary | ICD-10-CM | POA: Diagnosis not present

## 2020-11-30 DIAGNOSIS — N390 Urinary tract infection, site not specified: Secondary | ICD-10-CM | POA: Diagnosis not present

## 2020-11-30 LAB — COMPREHENSIVE METABOLIC PANEL
ALT: 59 U/L — ABNORMAL HIGH (ref 0–44)
AST: 51 U/L — ABNORMAL HIGH (ref 15–41)
Albumin: 3.8 g/dL (ref 3.5–5.0)
Alkaline Phosphatase: 78 U/L (ref 38–126)
Anion gap: 10 (ref 5–15)
BUN: 25 mg/dL — ABNORMAL HIGH (ref 8–23)
CO2: 20 mmol/L — ABNORMAL LOW (ref 22–32)
Calcium: 9.2 mg/dL (ref 8.9–10.3)
Chloride: 105 mmol/L (ref 98–111)
Creatinine, Ser: 1.32 mg/dL — ABNORMAL HIGH (ref 0.44–1.00)
GFR, Estimated: 41 mL/min — ABNORMAL LOW (ref >60)
Glucose, Bld: 166 mg/dL — ABNORMAL HIGH (ref 70–99)
Potassium: 3.1 mmol/L — ABNORMAL LOW (ref 3.5–5.1)
Sodium: 135 mmol/L (ref 135–145)
Total Bilirubin: 1.8 mg/dL — ABNORMAL HIGH (ref 0.3–1.2)
Total Protein: 6.7 g/dL (ref 6.5–8.1)

## 2020-11-30 LAB — CBC WITH DIFFERENTIAL/PLATELET
Abs Immature Granulocytes: 0.03 10*3/uL (ref 0.00–0.07)
Basophils Absolute: 0 10*3/uL (ref 0.0–0.1)
Basophils Relative: 0 %
Eosinophils Absolute: 0 10*3/uL (ref 0.0–0.5)
Eosinophils Relative: 0 %
HCT: 37.4 % (ref 36.0–46.0)
Hemoglobin: 13.1 g/dL (ref 12.0–15.0)
Immature Granulocytes: 0 %
Lymphocytes Relative: 5 %
Lymphs Abs: 0.5 10*3/uL — ABNORMAL LOW (ref 0.7–4.0)
MCH: 30.8 pg (ref 26.0–34.0)
MCHC: 35 g/dL (ref 30.0–36.0)
MCV: 88 fL (ref 80.0–100.0)
Monocytes Absolute: 0.9 10*3/uL (ref 0.1–1.0)
Monocytes Relative: 9 %
Neutro Abs: 8.4 10*3/uL — ABNORMAL HIGH (ref 1.7–7.7)
Neutrophils Relative %: 86 %
Platelets: 149 10*3/uL — ABNORMAL LOW (ref 150–400)
RBC: 4.25 MIL/uL (ref 3.87–5.11)
RDW: 12.2 % (ref 11.5–15.5)
WBC: 9.8 10*3/uL (ref 4.0–10.5)
nRBC: 0 % (ref 0.0–0.2)

## 2020-11-30 LAB — URINALYSIS, ROUTINE W REFLEX MICROSCOPIC
Bilirubin Urine: NEGATIVE
Glucose, UA: NEGATIVE mg/dL
Ketones, ur: NEGATIVE mg/dL
Nitrite: NEGATIVE
Protein, ur: 100 mg/dL — AB
Specific Gravity, Urine: 1.009 (ref 1.005–1.030)
WBC, UA: >50 WBC/hpf — ABNORMAL HIGH (ref 0–5)
pH: 5 (ref 5.0–8.0)

## 2020-11-30 LAB — RESP PANEL BY RT-PCR (FLU A&B, COVID) ARPGX2
Influenza A by PCR: NEGATIVE
Influenza B by PCR: NEGATIVE
SARS Coronavirus 2 by RT PCR: NEGATIVE

## 2020-11-30 LAB — LACTIC ACID, PLASMA: Lactic Acid, Venous: 0.8 mmol/L (ref 0.5–1.9)

## 2020-11-30 MED ORDER — SODIUM CHLORIDE 0.9 % IV BOLUS
1000.0000 mL | Freq: Once | INTRAVENOUS | Status: AC
  Administered 2020-11-30: 1000 mL via INTRAVENOUS

## 2020-11-30 MED ORDER — CEFTRIAXONE SODIUM 1 G IJ SOLR: 1.0000 g | Freq: Once | INTRAMUSCULAR | Status: DC

## 2020-11-30 MED ORDER — ACETAMINOPHEN 325 MG PO TABS
650.0000 mg | ORAL_TABLET | Freq: Once | ORAL | Status: AC
  Administered 2020-11-30: 650 mg via ORAL
  Filled 2020-11-30: qty 2

## 2020-11-30 MED ORDER — SODIUM CHLORIDE 0.9 % IV SOLN
1.0000 g | Freq: Once | INTRAVENOUS | Status: AC
  Administered 2020-11-30: 1 g via INTRAVENOUS
  Filled 2020-11-30: qty 10

## 2020-11-30 MED ORDER — CEFDINIR 300 MG PO CAPS: 300.0000 mg | ORAL_CAPSULE | Freq: Two times a day (BID) | ORAL | 0 refills | Status: DC

## 2020-11-30 NOTE — ED Triage Notes (Signed)
Pt reports tripping and falling. Pt states she hit head. Pt denies blood thinners and LOC.

## 2020-11-30 NOTE — ED Notes (Signed)
Pt ambulatory to restroom with minimal assistance. Pt attempting to provide urine specimen.

## 2020-11-30 NOTE — ED Notes (Signed)
Pt states she did not feel dizzy while standing up doing ortho vitals but stats dropped.

## 2020-11-30 NOTE — Discharge Instructions (Signed)
You were treated today for a UTI. Pick up Cefdnir (antibiotic) from [facility]. You will need to take twice daily for 5 days. This may cause some GI upset.  Please stay hydrated. Take your time moving from the sitting to standing position.   You will need to contact your PCP to follow up regarding the abnormal CT scan. 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  Please return to the ED if you don't improve. If you experience uncontrollable vomiting or diarrhea, or if you are too weak to perform routine tasks.

## 2020-11-30 NOTE — ED Provider Notes (Signed)
Emergency Medicine Provider Triage Evaluation Note  Michelle [name] , a 80 y.o. female  was evaluated in triage.  Pt complains of fall vs presyncopal episode that occurred earlier today. Pt reports she was standing and leaning over a table when she fell forward; hitting her head. She is unsure if she lost consciousness with the fall but was able to get back up on her own. Pt complains of a mild headache. Is not anticoagulated. She reports after the fall she felt very "warm" however did not think much of it. Found to be febrile here at 101.9. vaccinated x 4 (last booster 3 weeks ago). No urinary complaints, cough, SOB. No prodrome prior to the fall.   Review of Systems  Positive: + HA, + fever Negative: - neck stiffness, rash, cough, body aches, SOB  Physical Exam  BP (!) 113/58 (BP Location: Left Arm)   Pulse 77   Temp (!) 101.9 F (38.8 C) (Oral)   Resp 16   SpO2 94%  Gen:   Awake, no distress   Resp:  Normal effort  MSK:   Moves extremities without difficulty  Other:    Medical Decision Making  Medically screening exam initiated at 5:22 PM.  Appropriate orders placed.  Michelle [name] [place] was informed that the remainder of the evaluation will be completed by another provider, this initial triage assessment does not replace that evaluation, and the importance of remaining in the ED until their evaluation is complete.  81 year old who presents for fall vs pre syncope. + head injury. No LOC. + fever here. Nursing staff informed pt should be brought back sooner. Workup started with CT Head and C spine. Work up for fever initiated.    Eustaquio Maize, PA-C 11/30/20 1725    Luna Fuse, MD 11/30/20 2230

## 2020-11-30 NOTE — ED Provider Notes (Signed)
[facility] DEPT Provider Note   CSN: [id number] Arrival date & time: 11/30/20  1646     History Chief Complaint  Patient presents with  . Fall    Michelle [name] is a 80 y.o. female.  HPI    Patient presents to the emergency department for evaluation of fall. Happened earlier this morning when she stood up too fast. She fell forward and hit her head. She is unsure if she loss consciousness. Says she felt dizzy before falling when she stood up. She said she had a headache earlier, but that it has resolved. She also denies any acute vision changes. Reports she has been having dizzy spells the past week. They are worse in the mornings and happen when she stands up. It feels like lightheadedness, and lasts for minutes. This happens most days. She is not anticoagulated.  She denies any chest pain, SOB, cough, nausea or vomiting.   Past Medical History:  Diagnosis Date  . Allergic rhinitis   . Cystocele   . Fibromuscular dysplasia of renal artery ([place])   . Hypertension    fibromuscular dysplasia  . Osteoarthritis   . Osteoporosis   . PMS (premenstrual syndrome)   . Retinal hemorrhage, right    with macular degeneration    Patient Active Problem List   Diagnosis Date Noted  . 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 Macular degeneration of both eyes 08/11/2018  . Essential hypertension, malignant 07/03/2014  . History of renal artery stenosis 06/14/2014  . Squamous cell carcinoma in situ of skin 02/08/2013  . Dysplastic nevus of trunk 01/23/2013  . Contact dermatitis and eczema 10/31/2012  . MACULAR DEGENERATION, RIGHT EYE 10/23/2008  . Osteoporosis 09/29/2007  . Essential hypertension, benign 09/29/2007  . ALLERGIC RHINITIS 02/24/2007  . Generalized OA 02/24/2007    Past Surgical History:  Procedure Laterality Date  . ANGIOPLASTY     right renal 1981  . BTL    . cb x 3    . HEMORRHOID SURGERY    . IR PTA RENAL  VISCERAL  05/11/2017  . IR RADIOLOGIST EVAL & MGMT  04/21/2017  . IR RENAL BILAT S&I MOD SED  05/11/2017  . IR US GUIDE VASC ACCESS RIGHT  05/11/2017  . Rt ovarian cyst removed    . TONSILLECTOMY AND ADENOIDECTOMY       OB History   No obstetric history on file.     History reviewed. No pertinent family history.  Social History   Tobacco Use  . Smoking status: Former Research scientist (life sciences)  . Smokeless tobacco: Former Systems developer    Quit date: 10/27/1983  Substance Use Topics  . Alcohol use: No  . Drug use: No    Home Medications Prior to Admission medications   Medication Sig Start Date End Date Taking? Authorizing Provider  cefdinir (OMNICEF) 300 MG capsule Take 1 capsule (300 mg total) by mouth 2 (two) times daily for 5 days. 11/30/20 12/05/20 Yes Sherrill Raring, PA-C  aspirin EC 81 MG tablet Take 81 mg by mouth daily.    [provider]  beta carotene w/minerals (OCUVITE) tablet Take 1 tablet by mouth daily.    [provider]  Bevacizumab (AVASTIN) 100 MG/4ML SOLN Inject 1-10 mg into the eye See admin instructions. For eyes One every 19 weeks 1/10 mg    [provider]  cetirizine (ZYRTEC) 10 MG tablet Take 10 mg by mouth daily as needed (for allergies.).    [provider]  clonazePAM (KLONOPIN) 0.5 MG tablet Take 1 tablet (0.5 mg total) by mouth 2 (two) times daily as needed for anxiety. 1/2 to 1 tab po BID PRN 09/11/20   Cirigliano, Stanton Kidney K, DO  furosemide (LASIX) 20 MG tablet Take 10 mg by mouth daily.    [provider]  lisinopril (PRINIVIL,ZESTRIL) 20 MG tablet Take 20 mg by mouth daily.    [provider]  naproxen sodium (ANAPROX) 220 MG tablet Take 440 mg by mouth at bedtime as needed (for pain.).    [provider]  omeprazole (PRILOSEC) 20 MG capsule Take 20 mg by mouth daily before breakfast.    [provider]    Allergies    Iodinated diagnostic agents, [place], and Diphenhydramine hcl  Review of Systems   Review of  Systems  Constitutional: Negative for activity change, chills and fatigue.  HENT: Negative for congestion, ear discharge, ear pain and sore throat.   Eyes: Negative for photophobia, pain and visual disturbance.  Respiratory: Negative for cough and shortness of breath.   Cardiovascular: Negative for chest pain and palpitations.  Gastrointestinal: Negative for abdominal pain, nausea and vomiting.  Genitourinary: Negative for dysuria and hematuria.  Musculoskeletal: Negative for arthralgias and back pain.  Skin: Negative for color change and rash.  Neurological: Positive for dizziness and headaches. Negative for seizures.  Psychiatric/Behavioral: Negative for confusion.  All other systems reviewed and are negative.   Physical Exam Updated Vital Signs BP (!) 113/58 (BP Location: Left Arm)   Pulse 77   Temp (!) 101.9 F (38.8 C) (Oral)   Resp 16   SpO2 94%   Physical Exam Vitals and nursing note reviewed.  Constitutional:      General: She is not in acute distress.    Appearance: Normal appearance.     Comments: Patient was ambulatory when I entered room.   HENT:     Head: Normocephalic.  Eyes:     General: No scleral icterus.       Right eye: No discharge.        Left eye: No discharge.     Extraocular Movements: Extraocular movements intact.     Pupils: Pupils are equal, round, and reactive to light.  Cardiovascular:     Rate and Rhythm: Normal rate and regular rhythm.     Pulses: Normal pulses.     Heart sounds: Normal heart sounds. No murmur heard. No friction rub. No gallop.   Pulmonary:     Effort: Pulmonary effort is normal. No respiratory distress.     Breath sounds: Normal breath sounds.  Abdominal:     General: Abdomen is flat. Bowel sounds are normal. There is no distension.     Palpations: Abdomen is soft.     Tenderness: There is no abdominal tenderness.  Musculoskeletal:     Cervical back: Normal range of motion. No tenderness.  Skin:    General: Skin is  warm and dry.     Coloration: Skin is not jaundiced.  Neurological:     General: No focal deficit present.     Mental Status: She is alert and oriented to person, place, and time. Mental status is at baseline.     Cranial Nerves: Cranial nerves are intact. No cranial nerve deficit, dysarthria or facial asymmetry.     Sensory: Sensation is intact. No sensory deficit.     Motor: Motor function is intact. No weakness, tremor or pronator drift.     Coordination: Coordination is intact. Romberg sign  negative. Coordination normal.     Gait: Gait is intact. Gait normal.     ED Results / Procedures / Treatments   Labs (all labs ordered are listed, but only abnormal results are displayed) Labs Reviewed  COMPREHENSIVE METABOLIC PANEL - Abnormal; Notable for the following components:      Result Value   Potassium 3.1 (*)    CO2 20 (*)    Glucose, Bld 166 (*)    BUN 25 (*)    Creatinine, Ser 1.32 (*)    AST 51 (*)    ALT 59 (*)    Total Bilirubin 1.8 (*)    GFR, Estimated 41 (*)    All other components within normal limits  CBC WITH DIFFERENTIAL/PLATELET - Abnormal; Notable for the following components:   Platelets 149 (*)    Neutro Abs 8.4 (*)    Lymphs Abs 0.5 (*)    All other components within normal limits  URINALYSIS, ROUTINE W REFLEX MICROSCOPIC - Abnormal; Notable for the following components:   APPearance CLOUDY (*)    Hgb urine dipstick MODERATE (*)    Protein, ur 100 (*)    Leukocytes,Ua LARGE (*)    WBC, UA >50 (*)    Bacteria, UA MANY (*)    All other components within normal limits  RESP PANEL BY RT-PCR (FLU A&B, COVID) ARPGX2  CULTURE, BLOOD (ROUTINE X 2)  CULTURE, BLOOD (ROUTINE X 2)  URINE CULTURE  LACTIC ACID, PLASMA    EKG EKG Interpretation  Date/Time:  Saturday Nov 30 2020 18:29:18 EDT Ventricular Rate:  72 PR Interval:  201 QRS Duration: 94 QT Interval:  390 QTC Calculation: 427 R Axis:   -39 Text Interpretation: Sinus rhythm Left axis deviation Low  voltage, precordial leads Consider anterior infarct No significant change since last tracing Confirmed by Calvert Cantor [phone]) on 11/30/2020 6:46:55 PM   Radiology CT Head Wo Contrast  Result Date: 11/30/2020 CLINICAL DATA:  Fall versus pre syncopal episode earlier today. EXAM: CT HEAD WITHOUT CONTRAST CT CERVICAL SPINE WITHOUT CONTRAST TECHNIQUE: Multidetector CT imaging of the head and cervical spine was performed following the standard protocol without intravenous contrast. Multiplanar CT image reconstructions of the cervical spine were also generated. COMPARISON:  None. 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 This exerts minimal local mass effect on the surrounding structures. No evidence of acute infarction, hemorrhage, hydrocephalus, extra-axial collection, or midline shift. Vascular: No hyperdense vessel or unexpected calcification. Skull: Normal. Negative for fracture or focal lesion. Sinuses/Orbits: No acute finding. Other: None. CT CERVICAL SPINE FINDINGS Alignment: Normal. Skull base and vertebrae: No acute fracture. No primary bone lesion or focal pathologic process. Soft tissues and spinal canal: No prevertebral fluid or swelling. No visible canal hematoma. Disc levels:  Multilevel degenerative disc and joint disease. Upper chest: Negative. Other: None. IMPRESSION: 1. 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 Recommend MR without and with contrast for further evaluation. 2. No acute osseous injury in the cervical spine. Electronically Signed   By: Zerita Boers M.D.   On: 11/30/2020 18:32   CT Cervical Spine Wo Contrast  Result Date: 11/30/2020 CLINICAL DATA:  Fall versus pre syncopal episode earlier today. EXAM: CT HEAD WITHOUT CONTRAST CT CERVICAL SPINE WITHOUT CONTRAST TECHNIQUE: Multidetector CT imaging of the head and cervical spine was performed following the  standard protocol without intravenous contrast. Multiplanar CT  image reconstructions of the cervical spine were also generated. COMPARISON:  None. 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 This exerts minimal local mass effect on the surrounding structures. No evidence of acute infarction, hemorrhage, hydrocephalus, extra-axial collection, or midline shift. Vascular: No hyperdense vessel or unexpected calcification. Skull: Normal. Negative for fracture or focal lesion. Sinuses/Orbits: No acute finding. Other: None. CT CERVICAL SPINE FINDINGS Alignment: Normal. Skull base and vertebrae: No acute fracture. No primary bone lesion or focal pathologic process. Soft tissues and spinal canal: No prevertebral fluid or swelling. No visible canal hematoma. Disc levels:  Multilevel degenerative disc and joint disease. Upper chest: Negative. Other: None. IMPRESSION: 1. 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 Recommend MR without and with contrast for further evaluation. 2. No acute osseous injury in the cervical spine. Electronically Signed   By: Zerita Boers M.D.   On: 11/30/2020 18:32   DG Chest Port 1 View  Result Date: 11/30/2020 CLINICAL DATA:  Fall versus pre syncopal episode. Concern for infection. EXAM: PORTABLE CHEST 1 VIEW COMPARISON:  None. FINDINGS: The heart size and mediastinal contours are within normal limits. Both lungs are clear. The visualized skeletal structures are unremarkable. IMPRESSION: No active disease. Electronically Signed   By: Zerita Boers M.D.   On: 11/30/2020 17:58    Procedures Procedures   Medications Ordered in ED Medications  cefTRIAXone (ROCEPHIN) 1 g in sodium chloride 0.9 % 100 mL IVPB (1 g Intravenous New Bag/Given 11/30/20 2133)  acetaminophen (TYLENOL) tablet 650 mg (650 mg Oral Given 11/30/20 1836)  sodium chloride 0.9 % bolus 1,000 mL (0 mLs  Intravenous Stopped 11/30/20 2133)    ED Course  I have reviewed the triage vital signs and the nursing notes.  Pertinent labs & imaging results that were available during my care of the patient were reviewed by me and considered in my medical decision making (see chart for details).    MDM Rules/Calculators/A&P                          Patient is an 80 year old female presenting for fall. Nontoxic, patient sitting comfortably. PE reassuring.   Patient orthostatic vitals concerning for orthostatic hypotension - 129/61 sitting to 86/67 standing.  UA showed findings consistent with UTI. Likely the the source of her fever. Dizziness is likely due to hypostatic hypotension. Patient given fluids and started on Rocephin in the EM.  CT showed calcified mass in the pineal gland. Radiologist recommends MRI. Discussed with patient who knows to schedule MRI with and without contrast.   Patient reports feeling better after given fluids. She denies any weakness, dizziness or headache. I walked the patient around the ED and she denied any issues. She had excellent balance and no dizziness. Very stable on feet with stable vitals.  A/P: Patient has a UTI and findings suggestive of orthostatic hypotension. Discussed that UTI will need to be treated with antibiotics and her CT scan and orthostatic hypotension will need follow up from her PCP. She voiced understanding. Discussed wether she should be admitted to the hospital or could manage the conditions outpatient. Engaged in shared decision making with the patient who voiced understanding of the risks and benefits of both options. She voiced preference for outpatient management with strict return precautions given.   Discussed HPI, physical exam and plan  of care for this patient with attending Mali Sheldon. The attending physician agrees with plan of care.  Final Clinical Impression(s) / ED Diagnoses Final diagnoses:  Acute cystitis without hematuria   Orthostatic hypotension  Abnormal CT of the head    Rx / DC Orders ED Discharge Orders         Ordered    cefdinir (OMNICEF) 300 MG capsule  2 times daily        11/30/20 2122           Sherrill Raring, PA-C 11/30/20 2230    Truddie Hidden, MD 11/30/20 2258    Truddie Hidden, MD 11/30/20 [phone]

## 2020-12-01 ENCOUNTER — Other Ambulatory Visit: Payer: Self-pay

## 2020-12-01 ENCOUNTER — Telehealth (HOSPITAL_BASED_OUTPATIENT_CLINIC_OR_DEPARTMENT_OTHER): Payer: Self-pay | Admitting: Emergency Medicine

## 2020-12-01 ENCOUNTER — Encounter: Payer: Self-pay | Admitting: Family Medicine

## 2020-12-01 ENCOUNTER — Encounter (HOSPITAL_COMMUNITY): Payer: Self-pay | Admitting: Emergency Medicine

## 2020-12-01 ENCOUNTER — Inpatient Hospital Stay (HOSPITAL_COMMUNITY)
Admission: EM | Admit: 2020-12-01 | Discharge: 2020-12-03 | DRG: 690 | Disposition: A | Discharge status: Home / Self Care | Payer: Medicare HMO | Attending: Internal Medicine | Admitting: Internal Medicine

## 2020-12-01 DIAGNOSIS — Z20822 Contact with and (suspected) exposure to covid-19: Secondary | ICD-10-CM | POA: Diagnosis present

## 2020-12-01 DIAGNOSIS — I444 Left anterior fascicular block: Secondary | ICD-10-CM | POA: Diagnosis present

## 2020-12-01 DIAGNOSIS — N390 Urinary tract infection, site not specified: Principal | ICD-10-CM | POA: Diagnosis present

## 2020-12-01 DIAGNOSIS — H3530 Unspecified macular degeneration: Secondary | ICD-10-CM | POA: Diagnosis present

## 2020-12-01 DIAGNOSIS — Z79899 Other long term (current) drug therapy: Secondary | ICD-10-CM

## 2020-12-01 DIAGNOSIS — I10 Essential (primary) hypertension: Secondary | ICD-10-CM | POA: Diagnosis present

## 2020-12-01 DIAGNOSIS — B9689 Other specified bacterial agents as the cause of diseases classified elsewhere: Secondary | ICD-10-CM | POA: Diagnosis present

## 2020-12-01 DIAGNOSIS — Z888 Allergy status to other drugs, medicaments and biological substances status: Secondary | ICD-10-CM | POA: Diagnosis not present

## 2020-12-01 DIAGNOSIS — Z91041 Radiographic dye allergy status: Secondary | ICD-10-CM

## 2020-12-01 DIAGNOSIS — Z7982 Long term (current) use of aspirin: Secondary | ICD-10-CM

## 2020-12-01 DIAGNOSIS — Z1611 Resistance to penicillins: Secondary | ICD-10-CM | POA: Diagnosis present

## 2020-12-01 DIAGNOSIS — R4781 Slurred speech: Secondary | ICD-10-CM | POA: Diagnosis present

## 2020-12-01 DIAGNOSIS — R7881 Bacteremia: Secondary | ICD-10-CM | POA: Diagnosis present

## 2020-12-01 DIAGNOSIS — B9620 Unspecified Escherichia coli [E. coli] as the cause of diseases classified elsewhere: Secondary | ICD-10-CM | POA: Diagnosis present

## 2020-12-01 DIAGNOSIS — N179 Acute kidney failure, unspecified: Secondary | ICD-10-CM | POA: Diagnosis present

## 2020-12-01 DIAGNOSIS — M810 Age-related osteoporosis without current pathological fracture: Secondary | ICD-10-CM | POA: Diagnosis present

## 2020-12-01 DIAGNOSIS — E876 Hypokalemia: Secondary | ICD-10-CM | POA: Diagnosis present

## 2020-12-01 DIAGNOSIS — I951 Orthostatic hypotension: Secondary | ICD-10-CM | POA: Diagnosis present

## 2020-12-01 DIAGNOSIS — R410 Disorientation, unspecified: Secondary | ICD-10-CM

## 2020-12-01 DIAGNOSIS — Z85828 Personal history of other malignant neoplasm of skin: Secondary | ICD-10-CM

## 2020-12-01 DIAGNOSIS — Z91048 Other nonmedicinal substance allergy status: Secondary | ICD-10-CM | POA: Diagnosis not present

## 2020-12-01 DIAGNOSIS — J309 Allergic rhinitis, unspecified: Secondary | ICD-10-CM | POA: Diagnosis present

## 2020-12-01 DIAGNOSIS — D329 Benign neoplasm of meninges, unspecified: Secondary | ICD-10-CM | POA: Diagnosis present

## 2020-12-01 DIAGNOSIS — Z87891 Personal history of nicotine dependence: Secondary | ICD-10-CM | POA: Diagnosis not present

## 2020-12-01 LAB — COMPREHENSIVE METABOLIC PANEL
ALT: 49 U/L — ABNORMAL HIGH (ref 0–44)
AST: 32 U/L (ref 15–41)
Albumin: 3.4 g/dL — ABNORMAL LOW (ref 3.5–5.0)
Alkaline Phosphatase: 86 U/L (ref 38–126)
Anion gap: 7 (ref 5–15)
BUN: 23 mg/dL (ref 8–23)
CO2: 26 mmol/L (ref 22–32)
Calcium: 9 mg/dL (ref 8.9–10.3)
Chloride: 105 mmol/L (ref 98–111)
Creatinine, Ser: 1.07 mg/dL — ABNORMAL HIGH (ref 0.44–1.00)
GFR, Estimated: 52 mL/min — ABNORMAL LOW (ref >60)
Glucose, Bld: 145 mg/dL — ABNORMAL HIGH (ref 70–99)
Potassium: 3 mmol/L — ABNORMAL LOW (ref 3.5–5.1)
Sodium: 138 mmol/L (ref 135–145)
Total Bilirubin: 1.2 mg/dL (ref 0.3–1.2)
Total Protein: 6.3 g/dL — ABNORMAL LOW (ref 6.5–8.1)

## 2020-12-01 LAB — CBC WITH DIFFERENTIAL/PLATELET
Abs Immature Granulocytes: 0.02 10*3/uL (ref 0.00–0.07)
Basophils Absolute: 0 10*3/uL (ref 0.0–0.1)
Basophils Relative: 0 %
Eosinophils Absolute: 0 10*3/uL (ref 0.0–0.5)
Eosinophils Relative: 0 %
HCT: 35.2 % — ABNORMAL LOW (ref 36.0–46.0)
Hemoglobin: 12.2 g/dL (ref 12.0–15.0)
Immature Granulocytes: 0 %
Lymphocytes Relative: 6 %
Lymphs Abs: 0.4 10*3/uL — ABNORMAL LOW (ref 0.7–4.0)
MCH: 31.4 pg (ref 26.0–34.0)
MCHC: 34.7 g/dL (ref 30.0–36.0)
MCV: 90.5 fL (ref 80.0–100.0)
Monocytes Absolute: 0.6 10*3/uL (ref 0.1–1.0)
Monocytes Relative: 9 %
Neutro Abs: 5.7 10*3/uL (ref 1.7–7.7)
Neutrophils Relative %: 85 %
Platelets: 142 10*3/uL — ABNORMAL LOW (ref 150–400)
RBC: 3.89 MIL/uL (ref 3.87–5.11)
RDW: 12.5 % (ref 11.5–15.5)
WBC: 6.8 10*3/uL (ref 4.0–10.5)
nRBC: 0 % (ref 0.0–0.2)

## 2020-12-01 LAB — BLOOD CULTURE ID PANEL (REFLEXED) - BCID2

## 2020-12-01 LAB — URINALYSIS, ROUTINE W REFLEX MICROSCOPIC
Bilirubin Urine: NEGATIVE
Glucose, UA: NEGATIVE mg/dL
Ketones, ur: NEGATIVE mg/dL
Nitrite: NEGATIVE
Protein, ur: 30 mg/dL — AB
Specific Gravity, Urine: 1.009 (ref 1.005–1.030)
WBC, UA: >50 WBC/hpf — ABNORMAL HIGH (ref 0–5)
pH: 6 (ref 5.0–8.0)

## 2020-12-01 LAB — LACTIC ACID, PLASMA: Lactic Acid, Venous: 1.1 mmol/L (ref 0.5–1.9)

## 2020-12-01 LAB — PROTIME-INR
INR: 1 (ref 0.8–1.2)
Prothrombin Time: 13.6 seconds (ref 11.4–15.2)

## 2020-12-01 LAB — APTT: aPTT: 28 seconds (ref 24–36)

## 2020-12-01 LAB — MAGNESIUM: Magnesium: 2.3 mg/dL (ref 1.7–2.4)

## 2020-12-01 MED ORDER — FUROSEMIDE 20 MG PO TABS
20.0000 mg | ORAL_TABLET | Freq: Every day | ORAL | Status: DC
  Administered 2020-12-02: 20 mg via ORAL
  Filled 2020-12-01: qty 1

## 2020-12-01 MED ORDER — PANTOPRAZOLE SODIUM 40 MG PO TBEC
40.0000 mg | DELAYED_RELEASE_TABLET | Freq: Every day | ORAL | Status: DC
  Administered 2020-12-02 – 2020-12-03 (×2): 40 mg via ORAL
  Filled 2020-12-01 (×2): qty 1

## 2020-12-01 MED ORDER — LACTATED RINGERS IV BOLUS (SEPSIS)
1000.0000 mL | Freq: Once | INTRAVENOUS | Status: AC
  Administered 2020-12-01: 1000 mL via INTRAVENOUS

## 2020-12-01 MED ORDER — ASPIRIN EC 81 MG PO TBEC
81.0000 mg | DELAYED_RELEASE_TABLET | Freq: Every day | ORAL | Status: DC
  Administered 2020-12-02 – 2020-12-03 (×2): 81 mg via ORAL
  Filled 2020-12-01 (×2): qty 1

## 2020-12-01 MED ORDER — SODIUM CHLORIDE 0.9 % IV SOLN
2.0000 g | Freq: Once | INTRAVENOUS | Status: AC
  Administered 2020-12-01: 2 g via INTRAVENOUS
  Filled 2020-12-01: qty 20

## 2020-12-01 MED ORDER — ENOXAPARIN SODIUM 40 MG/0.4ML IJ SOSY
40.0000 mg | PREFILLED_SYRINGE | INTRAMUSCULAR | Status: DC
  Administered 2020-12-01 – 2020-12-02 (×2): 40 mg via SUBCUTANEOUS
  Filled 2020-12-01: qty 0.4

## 2020-12-01 MED ORDER — LACTATED RINGERS IV SOLN: INTRAVENOUS | Status: DC

## 2020-12-01 MED ORDER — LISINOPRIL 20 MG PO TABS: 20.0000 mg | ORAL_TABLET | Freq: Every day | ORAL | Status: DC

## 2020-12-01 MED ORDER — ACETAMINOPHEN 325 MG PO TABS: 650.0000 mg | ORAL_TABLET | Freq: Four times a day (QID) | ORAL | Status: DC | PRN

## 2020-12-01 MED ORDER — ACETAMINOPHEN 500 MG PO TABS
1000.0000 mg | ORAL_TABLET | Freq: Once | ORAL | Status: AC
  Administered 2020-12-01: 1000 mg via ORAL
  Filled 2020-12-01: qty 2

## 2020-12-01 MED ORDER — SODIUM CHLORIDE 0.9 % IV SOLN
2.0000 g | INTRAVENOUS | Status: DC
  Administered 2020-12-02 – 2020-12-03 (×2): 2 g via INTRAVENOUS
  Filled 2020-12-01: qty 20
  Filled 2020-12-01: qty 2
  Filled 2020-12-01: qty 20

## 2020-12-01 MED ORDER — LISINOPRIL 20 MG PO TABS
20.0000 mg | ORAL_TABLET | Freq: Every day | ORAL | Status: DC
  Administered 2020-12-02 – 2020-12-03 (×2): 20 mg via ORAL
  Filled 2020-12-01 (×2): qty 1

## 2020-12-01 MED ORDER — ASPIRIN EC 81 MG PO TBEC: 81.0000 mg | DELAYED_RELEASE_TABLET | Freq: Every day | ORAL | Status: DC

## 2020-12-01 MED ORDER — CLONAZEPAM 0.5 MG PO TABS: 0.5000 mg | ORAL_TABLET | Freq: Two times a day (BID) | ORAL | Status: DC | PRN

## 2020-12-01 MED ORDER — POTASSIUM CHLORIDE 10 MEQ/100ML IV SOLN
10.0000 meq | INTRAVENOUS | Status: AC
  Administered 2020-12-01 (×2): 10 meq via INTRAVENOUS
  Filled 2020-12-01 (×2): qty 100

## 2020-12-01 NOTE — ED Notes (Signed)
Placed purewick on patient. Patient is currently not in pain.

## 2020-12-01 NOTE — H&P (Signed)
History and Physical    Michelle [name] [id number] DOB: 1940-11-01 DOA: 12/01/2020  PCP: Ronnald Nian, DO  Patient coming from: Home, husband at bedside  I have personally briefly reviewed patient's old medical records in [place]  Chief Complaint: E. coli bacteremia  HPI: Michelle [name] is a 80 y.o. female with medical history significant for hypertension, history of fibromuscular dysplasia of the renal artery, and bilateral macular degeneration with functional blindness who presents after a call back for positive E. coli bacteremia.  Patient was evaluated in the ED yesterday after she developed acute onset of weakness, slurred speech and pre-syncope.  She was found to have UTI and orthostatic hypotension.  She was treated with IV Rocephin and discharged with Cefdinir.  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   Patient states she felt "100% better" after antibiotics yesterday but received call back today about positive E. coli blood cultures.  ED Course: She was febrile up to 100.3 but otherwise hemodynamically stable.  CBC showed no leukocytosis or anemia.  Mild hypokalemia with K of 3.  Review of Systems: Constitutional: No Weight Change, + Fever ENT/Mouth: No sore throat, No Rhinorrhea Eyes: No Eye Pain, No Vision Changes Cardiovascular: No Chest Pain, no SOB Respiratory: No Cough, No Sputum, No Wheezing, no Dyspnea  Gastrointestinal: No Nausea, No Vomiting, No Diarrhea, No Constipation, No Pain Genitourinary: no Urinary Incontinence, No Urgency, No Flank Pain Musculoskeletal: No Arthralgias, No Myalgias Skin: No Skin Lesions, No Pruritus, Neuro: no Weakness, No Numbness Psych: No Anxiety/Panic, No Depression, no decrease appetite Heme/Lymph: No Bruising, No Bleeding  Past Medical History:  Diagnosis Date  . Allergic rhinitis   . Cystocele   .  Fibromuscular dysplasia of renal artery ([place])   . Hypertension    fibromuscular dysplasia  . Osteoarthritis   . Osteoporosis   . PMS (premenstrual syndrome)   . Retinal hemorrhage, right    with macular degeneration    Past Surgical History:  Procedure Laterality Date  . ANGIOPLASTY     right renal 1981  . BTL    . cb x 3    . HEMORRHOID SURGERY    . IR PTA RENAL VISCERAL  05/11/2017  . IR RADIOLOGIST EVAL & MGMT  04/21/2017  . IR RENAL BILAT S&I MOD SED  05/11/2017  . IR US GUIDE VASC ACCESS RIGHT  05/11/2017  . Rt ovarian cyst removed    . TONSILLECTOMY AND ADENOIDECTOMY       reports that she has quit smoking. She quit smokeless tobacco use about 37 years ago. She reports that she does not drink alcohol and does not use drugs. Social History  Allergies  Allergen Reactions  . Iodinated Diagnostic Agents Hives    Patient believes this was an older form of iodinated contrast.  Tolerated OMNIPAQUE IOHEXOL 11/19/14 for renal arteriogram/angioplasty without prep. [name], RN  . [place]   . Diphenhydramine Hcl Hives    No family history on file.   Prior to Admission medications   Medication Sig Start Date End Date Taking? Authorizing Provider  aspirin EC 81 MG tablet Take 81 mg by mouth daily.   Yes [provider]  beta carotene w/minerals (OCUVITE) tablet Take 1 tablet by mouth daily.   Yes [provider]  Bevacizumab (AVASTIN) 100 MG/4ML SOLN Inject 1-10 mg into the eye See admin instructions.  For eyes One every 6 weeks 1/10 mg   Yes [provider]  cefdinir (OMNICEF) 300 MG capsule Take 1 capsule (300 mg total) by mouth 2 (two) times daily for 5 days. Patient taking differently: Take 300 mg by mouth 2 (two) times daily. Start date ; 12/01/20 11/30/20 12/05/20 Yes Sherrill Raring, PA-C  cetirizine (ZYRTEC) 10 MG tablet Take 10 mg by mouth daily as needed for allergies.   Yes [provider]  clonazePAM (KLONOPIN) 0.5 MG tablet Take 1 tablet  (0.5 mg total) by mouth 2 (two) times daily as needed for anxiety. 1/2 to 1 tab po BID PRN Patient taking differently: Take 0.5 mg by mouth 2 (two) times daily as needed for anxiety. 09/11/20  Yes Cirigliano, Mary K, DO  furosemide (LASIX) 20 MG tablet Take 20 mg by mouth daily.   Yes [provider]  lisinopril (PRINIVIL,ZESTRIL) 20 MG tablet Take 20 mg by mouth daily.   Yes [provider]  omeprazole (PRILOSEC) 20 MG capsule Take 20 mg by mouth daily before breakfast.   Yes [provider]    Physical Exam: Vitals:   12/01/20 1736 12/01/20 1849 12/01/20 1900 12/01/20 1930  BP:  (!) 148/65 127/60 (!) 141/74  Pulse:  67 63 64  Resp:  18 (!) 22 20  Temp:  98.9 F (37.2 C)  100.3 F (37.9 C)  TempSrc:  Oral  Oral  SpO2:  98% 99% 100%  Weight: 68 kg     Height: 5\' 3"  (1.6 m)       Constitutional: NAD, calm, comfortable, elderly female appearing younger than stated age sitting at 40 degree incline in bed Vitals:   12/01/20 1736 12/01/20 1849 12/01/20 1900 12/01/20 1930  BP:  (!) 148/65 127/60 (!) 141/74  Pulse:  67 63 64  Resp:  18 (!) 22 20  Temp:  98.9 F (37.2 C)  100.3 F (37.9 C)  TempSrc:  Oral  Oral  SpO2:  98% 99% 100%  Weight: 68 kg     Height: 5\' 3"  (1.6 m)      Eyes: PERRL, lids and conjunctivae normal ENMT: Mucous membranes are moist.  Neck: normal, supple Respiratory: clear to auscultation bilaterally, no wheezing, no crackles. Normal respiratory effort. No accessory muscle use.  Cardiovascular: Regular rate and rhythm, no murmurs / rubs / gallops. No extremity edema.   Abdomen: no tenderness, no masses palpated.  Bowel sounds positive.  Musculoskeletal: no clubbing / cyanosis. No joint deformity upper and lower extremities. Good ROM, no contractures. Normal muscle tone.  Skin: no rashes, lesions, ulcers. No induration Neurologic: CN 2-12 grossly intact. Sensation intact,  Strength 5/5 in all 4.  Psychiatric: Normal judgment and  insight. Alert and oriented x 3. Normal mood.     Labs on Admission: I have personally reviewed following labs and imaging studies  CBC: Recent Labs  Lab 11/30/20 1801 12/01/20 1809  WBC 9.8 6.8  NEUTROABS 8.4* 5.7  HGB 13.1 12.2  HCT 37.4 35.2*  MCV 88.0 90.5  PLT 149* 191*   Basic Metabolic Panel: Recent Labs  Lab 11/30/20 1801 12/01/20 1731 12/01/20 1809  NA 135  --  138  K 3.1*  --  3.0*  CL 105  --  105  CO2 20*  --  26  GLUCOSE 166*  --  145*  BUN 25*  --  23  CREATININE 1.32*  --  1.07*  CALCIUM 9.2  --  9.0  MG  --  2.3  --  GFR: Estimated Creatinine Clearance: 38.1 mL/min (A) (by C-G formula based on SCr of 1.07 mg/dL (H)). Liver Function Tests: Recent Labs  Lab 11/30/20 1801 12/01/20 1809  AST 51* 32  ALT 59* 49*  ALKPHOS 78 86  BILITOT 1.8* 1.2  PROT 6.7 6.3*  ALBUMIN 3.8 3.4*   No results for input(s): LIPASE, AMYLASE in the last 168 hours. No results for input(s): AMMONIA in the last 168 hours. Coagulation Profile: Recent Labs  Lab 12/01/20 1809  INR 1.0   Cardiac Enzymes: No results for input(s): CKTOTAL, CKMB, CKMBINDEX, TROPONINI in the last 168 hours. BNP (last 3 results) No results for input(s): PROBNP in the last 8760 hours. HbA1C: No results for input(s): HGBA1C in the last 72 hours. CBG: No results for input(s): GLUCAP in the last 168 hours. Lipid Profile: No results for input(s): CHOL, HDL, LDLCALC, TRIG, CHOLHDL, LDLDIRECT in the last 72 hours. Thyroid Function Tests: No results for input(s): TSH, T4TOTAL, FREET4, T3FREE, THYROIDAB in the last 72 hours. Anemia Panel: No results for input(s): VITAMINB12, FOLATE, FERRITIN, TIBC, IRON, RETICCTPCT in the last 72 hours. Urine analysis:    Component Value Date/Time   COLORURINE YELLOW 12/01/2020 1745   APPEARANCEUR HAZY (A) 12/01/2020 1745   LABSPEC 1.009 12/01/2020 1745   PHURINE 6.0 12/01/2020 1745   GLUCOSEU NEGATIVE 12/01/2020 1745   HGBUR MODERATE (A) 12/01/2020  1745   HGBUR negative 10/24/2009 0818   BILIRUBINUR NEGATIVE 12/01/2020 1745   BILIRUBINUR n 11/01/2013 1113   KETONESUR NEGATIVE 12/01/2020 1745   PROTEINUR 30 (A) 12/01/2020 1745   UROBILINOGEN 0.2 11/01/2013 1113   UROBILINOGEN 0.2 10/24/2009 0818   NITRITE NEGATIVE 12/01/2020 1745   LEUKOCYTESUR LARGE (A) 12/01/2020 1745    Radiological Exams on Admission: CT Head Wo Contrast  Result Date: 11/30/2020 CLINICAL DATA:  Fall versus pre syncopal episode earlier today. EXAM: CT HEAD WITHOUT CONTRAST CT CERVICAL SPINE WITHOUT CONTRAST TECHNIQUE: Multidetector CT imaging of the head and cervical spine was performed following the standard protocol without intravenous contrast. Multiplanar CT image reconstructions of the cervical spine were also generated. COMPARISON:  None. 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 This exerts minimal local mass effect on the surrounding structures. No evidence of acute infarction, hemorrhage, hydrocephalus, extra-axial collection, or midline shift. Vascular: No hyperdense vessel or unexpected calcification. Skull: Normal. Negative for fracture or focal lesion. Sinuses/Orbits: No acute finding. Other: None. CT CERVICAL SPINE FINDINGS Alignment: Normal. Skull base and vertebrae: No acute fracture. No primary bone lesion or focal pathologic process. Soft tissues and spinal canal: No prevertebral fluid or swelling. No visible canal hematoma. Disc levels:  Multilevel degenerative disc and joint disease. Upper chest: Negative. Other: None. IMPRESSION: 1. 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 Recommend MR without and with contrast for further evaluation. 2. No acute osseous injury in the cervical spine. Electronically Signed   By: Zerita Boers M.D.   On: 11/30/2020 18:32   CT Cervical Spine Wo Contrast  Result Date: 11/30/2020 CLINICAL DATA:  Fall  versus pre syncopal episode earlier today. EXAM: CT HEAD WITHOUT CONTRAST CT CERVICAL SPINE WITHOUT CONTRAST TECHNIQUE: Multidetector CT imaging of the head and cervical spine was performed following the standard protocol without intravenous contrast. Multiplanar CT image reconstructions of the cervical spine were also generated. COMPARISON:  None. FINDINGS: CT HEAD FINDINGS Brain: There is a 1.5 cm calcified  mass in the region of the pineal gland (located just superior and posterior to the pineal gland). This exerts minimal local mass effect on the surrounding structures. No evidence of acute infarction, hemorrhage, hydrocephalus, extra-axial collection, or midline shift. Vascular: No hyperdense vessel or unexpected calcification. Skull: Normal. Negative for fracture or focal lesion. Sinuses/Orbits: No acute finding. Other: None. CT CERVICAL SPINE FINDINGS Alignment: Normal. Skull base and vertebrae: No acute fracture. No primary bone lesion or focal pathologic process. Soft tissues and spinal canal: No prevertebral fluid or swelling. No visible canal hematoma. Disc levels:  Multilevel degenerative disc and joint disease. Upper chest: Negative. Other: None. IMPRESSION: 1. 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 Recommend MR without and with contrast for further evaluation. 2. No acute osseous injury in the cervical spine. Electronically Signed   By: Zerita Boers M.D.   On: 11/30/2020 18:32   DG Chest Port 1 View  Result Date: 11/30/2020 CLINICAL DATA:  Fall versus pre syncopal episode. Concern for infection. EXAM: PORTABLE CHEST 1 VIEW COMPARISON:  None. FINDINGS: The heart size and mediastinal contours are within normal limits. Both lungs are clear. The visualized skeletal structures are unremarkable. IMPRESSION: No active disease. Electronically Signed   By: Zerita Boers M.D.   On: 11/30/2020 17:58      Assessment/Plan  E. coli bacteremia -Patient presented to ED yesterday with weakness and  found to have UTI and orthostatic hypotension.  She was treated with IV Rocephin and discharged home antibiotics.   -Aerobic cultures positive for E. coli bacteremia -Continue IV Rocephin -Repeat blood cultures pending  Hypokalemia  - Repleted with IV K in the ED  Hypertension -continue Lisinopril and furosemide   Bilateral macular degeneration -Has functional blindness  DVT prophylaxis:.Lovenox Code Status: Full Family Communication: Plan discussed with patient and husband at bedside  disposition Plan: Home with at least 2 midnight stays  Consults called:  Admission status: inpatient  Level of care: Med-Surg  Status is: Inpatient  Remains inpatient appropriate because:Inpatient level of care appropriate due to severity of illness   Dispo: The patient is from: Home              Anticipated d/c is to: Home              Patient currently is not medically stable to d/c.   Difficult to place patient No         Orene Desanctis DO [facility]   If 7PM-7AM, please contact night-coverage [email or web address]   12/01/2020, 8:10 PM

## 2020-12-01 NOTE — ED Provider Notes (Addendum)
[facility] DEPT Provider Note   CSN: [id number] Arrival date & time: 12/01/20  1654     History Chief Complaint  Patient presents with  . Abnormal Lab    Michelle [name] is a 80 y.o. female presented emergency department with positive E. coli blood cultures.  The patient was seen in the emergency department yesterday for general fatigue, weakness, fall, head injury.  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  She was given fluids and Rocephin and discharged.  She says she felt significantly better after this management in the ED.  She has had very minimal symptoms at home since then.  She was contacted today by her PCPs office because her blood cultures returned positive for E. coli.  She was advised to come back to the ED.  She has no prior history of sepsis or bacteremia.  She is here with her husband at the bedside.  She has visual deficits at baseline and is functionally blind.  Of note, the patient had negative COVID flu PCR yesterday.  She also had an unremarkable chest x-ray performed yesterday.  HPI     Past Medical History:  Diagnosis Date  . Allergic rhinitis   . Cystocele   . Fibromuscular dysplasia of renal artery ([place])   . Hypertension    fibromuscular dysplasia  . Osteoarthritis   . Osteoporosis   . PMS (premenstrual syndrome)   . Retinal hemorrhage, right    with macular degeneration    Patient Active Problem List   Diagnosis Date Noted  . Cognitive decline 12/13/2020  . Meningioma ([place]) 12/13/2020  . Bacteremia 12/01/2020  . Hypokalemia 12/01/2020  . 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 Macular degeneration of both eyes 08/11/2018  . Essential hypertension, malignant 07/03/2014  . History of renal artery stenosis 06/14/2014  . Squamous  cell carcinoma in situ of skin 02/08/2013  . Dysplastic nevus of trunk 01/23/2013  . Contact dermatitis and eczema 10/31/2012  . MACULAR DEGENERATION, RIGHT EYE 10/23/2008  . Osteoporosis 09/29/2007  . Primary hypertension 09/29/2007  . ALLERGIC RHINITIS 02/24/2007  . Generalized OA 02/24/2007    Past Surgical History:  Procedure Laterality Date  . ANGIOPLASTY     right renal 1981  . BTL    . cb x 3    . HEMORRHOID SURGERY    . IR PTA RENAL VISCERAL  05/11/2017  . IR RADIOLOGIST EVAL & MGMT  04/21/2017  . IR RENAL BILAT S&I MOD SED  05/11/2017  . IR US GUIDE VASC ACCESS RIGHT  05/11/2017  . Rt ovarian cyst removed    . TONSILLECTOMY AND ADENOIDECTOMY       OB History   No obstetric history on file.     History reviewed. No pertinent family history.  Social History   Tobacco Use  . Smoking status: Former Research scientist (life sciences)  . Smokeless tobacco: Former Systems developer    Quit date: 10/27/1983  Substance Use Topics  . Alcohol use: No  . Drug use: No    Home Medications Prior to Admission medications   Medication Sig Start Date End Date Taking? Authorizing Provider  aspirin EC 81 MG tablet Take 81 mg by mouth daily.   Yes [provider]  beta carotene w/minerals (OCUVITE) tablet Take 1 tablet by mouth daily.  Yes [provider]  Bevacizumab (AVASTIN) 100 MG/4ML SOLN Inject 1-10 mg into the eye See admin instructions. For eyes One every 6 weeks 1/10 mg   Yes [provider]  cetirizine (ZYRTEC) 10 MG tablet Take 10 mg by mouth daily as needed for allergies.   Yes [provider]  clonazePAM (KLONOPIN) 0.5 MG tablet Take 1 tablet (0.5 mg total) by mouth 2 (two) times daily as needed for anxiety. 1/2 to 1 tab po BID PRN Patient not taking: Reported on 12/13/2020 09/11/20  Yes Cirigliano, Garvin Fila, DO  omeprazole (PRILOSEC) 20 MG capsule Take 20 mg by mouth daily before breakfast.   Yes [provider]  cloNIDine (CATAPRES) 0.1 MG tablet Take 1 tablet  (0.1 mg total) by mouth 2 (two) times daily. 12/13/20   Libby Maw, MD  donepezil (ARICEPT) 5 MG tablet Take 1 tablet (5 mg total) by mouth at bedtime. Start 1 tablet daily x 4 weeks and then 2 tablets daily 12/11/20   Garvin Fila, MD  lisinopril (ZESTRIL) 20 MG tablet Take one tablet twice daily 12/13/20   Libby Maw, MD    Allergies    Iodinated diagnostic agents, [place], and Diphenhydramine hcl  Review of Systems   Review of Systems  Constitutional: Positive for fatigue. Negative for chills and fever.  Respiratory: Negative for cough and shortness of breath.   Cardiovascular: Negative for chest pain and palpitations.  Gastrointestinal: Negative for abdominal pain and vomiting.  Genitourinary: Negative for dysuria and hematuria.  Musculoskeletal: Negative for arthralgias and myalgias.  Skin: Negative for color change and rash.  Neurological: Negative for syncope and light-headedness.  All other systems reviewed and are negative.   Physical Exam Updated Vital Signs BP (!) 144/68 (BP Location: Right Arm)   Pulse (!) 55   Temp 99.2 F (37.3 C) (Oral)   Resp 17   Ht 5\' 3"  (1.6 m)   Wt 73.9 kg   SpO2 97%   BMI 28.87 kg/m   Physical Exam Constitutional:      General: She is not in acute distress. HENT:     Head: Normocephalic and atraumatic.  Eyes:     Conjunctiva/sclera: Conjunctivae normal.     Pupils: Pupils are equal, round, and reactive to light.  Cardiovascular:     Rate and Rhythm: Normal rate and regular rhythm.  Pulmonary:     Effort: Pulmonary effort is normal. No respiratory distress.  Abdominal:     General: There is no distension.     Tenderness: There is no abdominal tenderness. There is no guarding.  Skin:    General: Skin is warm and dry.  Neurological:     General: No focal deficit present.     Mental Status: She is alert. Mental status is at baseline.  Psychiatric:        Mood and Affect: Mood normal.        Behavior:  Behavior normal.     ED Results / Procedures / Treatments   Labs (all labs ordered are listed, but only abnormal results are displayed) Labs Reviewed  URINE CULTURE - Abnormal; Notable for the following components:      Result Value   Culture MULTIPLE SPECIES PRESENT, SUGGEST RECOLLECTION (*)    All other components within normal limits  COMPREHENSIVE METABOLIC PANEL - Abnormal; Notable for the following components:   Potassium 3.0 (*)    Glucose, Bld 145 (*)    Creatinine, Ser 1.07 (*)  Total Protein 6.3 (*)    Albumin 3.4 (*)    ALT 49 (*)    GFR, Estimated 52 (*)    All other components within normal limits  CBC WITH DIFFERENTIAL/PLATELET - Abnormal; Notable for the following components:   HCT 35.2 (*)    Platelets 142 (*)    Lymphs Abs 0.4 (*)    All other components within normal limits  URINALYSIS, ROUTINE W REFLEX MICROSCOPIC - Abnormal; Notable for the following components:   APPearance HAZY (*)    Hgb urine dipstick MODERATE (*)    Protein, ur 30 (*)    Leukocytes,Ua LARGE (*)    WBC, UA >50 (*)    Bacteria, UA RARE (*)    All other components within normal limits  BASIC METABOLIC PANEL - Abnormal; Notable for the following components:   Potassium 3.4 (*)    Glucose, Bld 112 (*)    Creatinine, Ser 1.19 (*)    Calcium 8.5 (*)    GFR, Estimated 46 (*)    Anion gap 1 (*)    All other components within normal limits  CBC - Abnormal; Notable for the following components:   RBC 3.40 (*)    Hemoglobin 10.6 (*)    HCT 31.3 (*)    Platelets 138 (*)    All other components within normal limits  BASIC METABOLIC PANEL - Abnormal; Notable for the following components:   Potassium 3.0 (*)    Glucose, Bld 102 (*)    Creatinine, Ser 1.10 (*)    GFR, Estimated 50 (*)    Anion gap 4 (*)    All other components within normal limits  CBC WITH DIFFERENTIAL/PLATELET - Abnormal; Notable for the following components:   RBC 3.65 (*)    Hemoglobin 11.2 (*)    HCT 34.4 (*)     Platelets 144 (*)    All other components within normal limits  CULTURE, BLOOD (ROUTINE X 2)  CULTURE, BLOOD (ROUTINE X 2)  LACTIC ACID, PLASMA  PROTIME-INR  APTT  MAGNESIUM    EKG EKG Interpretation  Date/Time:  Sunday Dec 01 2020 17:43:55 EDT Ventricular Rate:  69 PR Interval:  193 QRS Duration: 88 QT Interval:  379 QTC Calculation: 406 R Axis:   -41 Text Interpretation: Sinus rhythm Left anterior fascicular block Low voltage, precordial leads Consider anterior infarct Baseline wander leadIII and aVR ,no acute ischemic changes Confirmed by Octaviano Glow [phone]) on 12/01/2020 6:00:37 PM   Radiology No results found.  Procedures .Critical Care Performed by: Wyvonnia Dusky, MD Authorized by: Wyvonnia Dusky, MD   Critical care provider statement:    Critical care time (minutes):  45   Critical care was necessary to treat or prevent imminent or life-threatening deterioration of the following conditions:  Sepsis   Critical care was time spent personally by me on the following activities:  Discussions with consultants, evaluation of patient's response to treatment, examination of patient, ordering and performing treatments and interventions, ordering and review of laboratory studies, ordering and review of radiographic studies, pulse oximetry, re-evaluation of patient's condition, obtaining history from patient or surrogate and review of old charts     Medications Ordered in ED Medications  lactated ringers bolus 1,000 mL (0 mLs Intravenous Stopped 12/01/20 1908)    And  lactated ringers bolus 1,000 mL (0 mLs Intravenous Stopped 12/01/20 1931)  cefTRIAXone (ROCEPHIN) 2 g in sodium chloride 0.9 % 100 mL IVPB (0 g Intravenous Stopped 12/01/20 1931)  potassium chloride  10 mEq in 100 mL IVPB (0 mEq Intravenous Stopped 12/01/20 2214)  acetaminophen (TYLENOL) tablet 1,000 mg (1,000 mg Oral Given 12/01/20 1958)  melatonin tablet 6 mg (6 mg Oral Given 12/02/20 2345)  potassium  chloride SA (KLOR-CON) CR tablet 40 mEq (40 mEq Oral Given 12/03/20 1128)    ED Course  I have reviewed the triage vital signs and the nursing notes.  Pertinent labs & imaging results that were available during my care of the patient were reviewed by me and considered in my medical decision making (see chart for details).  This patient presents with positive blood cultures w/ E. Coli, bacteremia likely 2/2 UTI.   She received IV Rocephin and fluids yesterday in the ER and is feeling significantly better.  However she will need repeat blood cultures and admission until cultures clear.  I have ordered 2 L of LR fluid bolus.  I have also ordered another dose of IV Rocephin as it is now 24 hours since her last.  Patient and her husband are requesting that the MRI of the brain for this pineal gland mass to be obtained while in the hospital.  I advised that I will discuss this with the hospitalist, as this is non-emergent but may be obtainable.  Prior medical course reviewed including blood culture results. Additional history provided by the patient's husband at the bedside.  Of note, the patient had negative COVID flu PCR yesterday.   She also had an unremarkable chest x-ray performed yesterday.  I would not repeat these testings at this time.  I ordered, reviewed, and interpreted labs.  No life-threatening abnormalities were noted on these tests.   Patient's labs reviewed showing K 3.0, WBC 6.8, INR 1, UA with large lueks & WBC.  Lactate normal.  Mg 2.3.  After the interventions stated above, I reevaluated the patient and found a new temp of 100.5F.  Tylenol ordered.  K also ordered.   Clinical Course as of 12/18/20 8546  Sun Dec 01, 2020  2703 Labs are relatively stable from yesterday.  Persistent hypokalemia.  I will add on a magnesium and infuse IV potassium.  Okay for admission [MT]  1918 Admitted to hospitalist. [MT]    Clinical Course User Index [MT] Wyvonnia Dusky, MD    Final  Clinical Impression(s) / ED Diagnoses Final diagnoses:  E coli bacteremia  Urinary tract infection without hematuria, site unspecified  Hypokalemia    Rx / DC Orders ED Discharge Orders         Ordered    cefdinir (OMNICEF) 300 MG capsule  2 times daily        12/03/20 1058    Increase activity slowly        12/03/20 1058    Diet - low sodium heart healthy        12/03/20 1058    Ambulatory referral to Neurology       Comments: An appointment is requested in approximately: 1 week. Post hospitalization follow up, confusion and disorientation.   12/03/20 1058           Wyvonnia Dusky, MD 12/01/20 2249    Wyvonnia Dusky, MD 12/18/20 [phone]

## 2020-12-01 NOTE — ED Triage Notes (Signed)
Patient seen yesterday for cystitis. Called to return for further evaluation of positive blood cultures. Patient denies complaints and pain today. Reports she is blind. Ambulatory to restroom with guidance.

## 2020-12-01 NOTE — ED Notes (Addendum)
Pt ok to eat/drink per Dr Langston Masker. Ham sandwich/apple sauce/Diet Cola. Kavin Leech, NT

## 2020-12-01 NOTE — ED Notes (Signed)
Family at bedside. 

## 2020-12-02 ENCOUNTER — Inpatient Hospital Stay (HOSPITAL_COMMUNITY): Payer: Medicare HMO

## 2020-12-02 LAB — CBC
HCT: 31.3 % — ABNORMAL LOW (ref 36.0–46.0)
Hemoglobin: 10.6 g/dL — ABNORMAL LOW (ref 12.0–15.0)
MCH: 31.2 pg (ref 26.0–34.0)
MCHC: 33.9 g/dL (ref 30.0–36.0)
MCV: 92.1 fL (ref 80.0–100.0)
Platelets: 138 10*3/uL — ABNORMAL LOW (ref 150–400)
RBC: 3.4 MIL/uL — ABNORMAL LOW (ref 3.87–5.11)
RDW: 12.5 % (ref 11.5–15.5)
WBC: 4.9 10*3/uL (ref 4.0–10.5)
nRBC: 0 % (ref 0.0–0.2)

## 2020-12-02 LAB — BASIC METABOLIC PANEL
Anion gap: 1 — ABNORMAL LOW (ref 5–15)
BUN: 19 mg/dL (ref 8–23)
CO2: 29 mmol/L (ref 22–32)
Calcium: 8.5 mg/dL — ABNORMAL LOW (ref 8.9–10.3)
Chloride: 110 mmol/L (ref 98–111)
Creatinine, Ser: 1.19 mg/dL — ABNORMAL HIGH (ref 0.44–1.00)
GFR, Estimated: 46 mL/min — ABNORMAL LOW (ref >60)
Glucose, Bld: 112 mg/dL — ABNORMAL HIGH (ref 70–99)
Potassium: 3.4 mmol/L — ABNORMAL LOW (ref 3.5–5.1)
Sodium: 140 mmol/L (ref 135–145)

## 2020-12-02 MED ORDER — CLONAZEPAM 0.5 MG PO TABS
0.5000 mg | ORAL_TABLET | Freq: Two times a day (BID) | ORAL | Status: DC
  Administered 2020-12-02 – 2020-12-03 (×2): 0.5 mg via ORAL
  Filled 2020-12-02 (×2): qty 1

## 2020-12-02 MED ORDER — HALOPERIDOL 0.5 MG PO TABS
1.0000 mg | ORAL_TABLET | Freq: Four times a day (QID) | ORAL | Status: DC | PRN
  Administered 2020-12-02 – 2020-12-03 (×2): 1 mg via ORAL
  Filled 2020-12-02 (×2): qty 2

## 2020-12-02 MED ORDER — MELATONIN 3 MG PO TABS
6.0000 mg | ORAL_TABLET | Freq: Once | ORAL | Status: AC
  Administered 2020-12-02: 6 mg via ORAL
  Filled 2020-12-02: qty 2

## 2020-12-02 MED ORDER — HALOPERIDOL LACTATE 5 MG/ML IJ SOLN: 1.0000 mg | Freq: Four times a day (QID) | INTRAMUSCULAR | Status: DC | PRN

## 2020-12-02 NOTE — Progress Notes (Signed)
PROGRESS NOTE    Michelle [name]  QIO:[id number] DOB: May 30, 1941 DOA: 12/01/2020 PCP: Ronnald Nian, DO    Brief Narrative:  Michelle [name] was admitted to the hospital working diagnosis of urinary tract infection complicated with E. coli bacteremia  80 year old female past medical history for hypertension, history of fibromuscular dysplasia of the renal artery, bilateral macular degeneration, chronic hallucinations who presented with acute onset of weakness, slurred speech and presyncope episode.  Initially evaluated in the ED, diagnosed with urinary tract infection, received intravenous fluids and IV antibiotics.  She felt better and was discharged home. Her urine culture became positive for E. coli and she was called back to come to the hospital.  On her initial physical examination blood pressure 148/65, heart rate 67, respiratory rate 18, temperature 100.71F, oxygen saturation 98%, her lungs are clear to auscultation bilaterally, heart S1-S2, present rhythmic, soft abdomen, no lower extremity edema.  Neurologically she was nonfocal.  Sodium 138, potassium 3.0, chloride 105, bicarb 26, glucose 145, BUN 23, creatinine 1.0, white count 6.8, hemoglobin 12.2, hematocrit 35.2, platelets 142. SARS COVID-19 negative.  Urinalysis specific gravity 1.009, 0-5 red cells,> 50 white cells.  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  No acute cervical spine changes. Chest radiograph no infiltrates.  EKG 69 bpm, left axis deviation, left anterior fascicular block, sinus rhythm, no ST segment or T wave changes.  Assessment & Plan:   Principal Problem:   Bacteremia Active Problems:   Essential hypertension, benign   Macular degeneration of both eyes   Hypokalemia   1. Gram negative bacteremia, due E coli urine infection. (no sepsis). Present on admission.  Patient has been confused and altered.  Will continue antibiotic therapy with IV ceftriaxone.  Follow up on  sensitives, follow up on cell count in am. Patient has been afebrile.    2. Macular degeneration with hallucinations and confusion. Per her husband at the bedside this has been ongoing for a few months.  Continue neuro checks per unit protocol.  Will get Brain MRI for further work up.  Will change clonazepam from PRN to bid, add as needed haldol in case of agitation.   3. HTN. Continue blood pressure control with lisinopril, will hold on furosemide for now.  Episodic atrial tachycardia 134, patient is pulling telemetry monitor. Will dc cardiac monitor for now.   4. AKI/ Hypokalemia. Renal function with serum cr at 1,19, on 05/07 was 1,32, K today is 3,4 and serum bicarbonate is 29. Will add 40 meq Kcl and will follow renal panel in am, discontinue furosemide.   Patient continue to be at high risk for worsening bacteremia   Status is: Inpatient  Remains inpatient appropriate because:IV treatments appropriate due to intensity of illness or inability to take PO   Dispo: The patient is from: Home              Anticipated d/c is to: Home              Patient currently is not medically stable to d/c.   Difficult to place patient No  DVT prophylaxis: Enoxaparin   Code Status:   full  Family Communication:  I spoke with patient's husband at the bedside, we talked in detail about patient's condition, plan of care and prognosis and all questions were addressed.      Antimicrobials:   Ceftriaxone     Subjective: Patient continue to have hallucinations, is confused but not agitated, her husband  is at the bedside and confirms patient has been suffering from hallucinations for a few months.    Objective: Vitals:   12/01/20 2221 12/02/20 0604 12/02/20 0932 12/02/20 1421  BP: 123/63 (!) 141/56 (!) 141/61 (!) 142/60  Pulse: (!) 50 (!) 49 (!) 54 (!) 48  Resp: 16 16 19 18   Temp: 98.3 F (36.8 C) 98 F (36.7 C) 97.8 F (36.6 C) 97.8 F (36.6 C)  TempSrc: Oral Oral Oral Oral  SpO2:  98% 96% 99% 99%  Weight: 74.2 kg     Height:        Intake/Output Summary (Last 24 hours) at 12/02/2020 1601 Last data filed at 12/02/2020 1240 Gross per 24 hour  Intake 4013.54 ml  Output --  Net 4013.54 ml   Filed Weights   12/01/20 1736 12/01/20 2221  Weight: 68 kg 74.2 kg    Examination:   General: Not in pain or dyspnea, deconditioned  Neurology: Awake and alert, positive confusion but not agitation.   E ENT: mild pallor, no icterus, oral mucosa moist Cardiovascular: No JVD. S1-S2 present, rhythmic, no gallops, rubs, or murmurs. No lower extremity edema. Pulmonary: positive breath sounds bilaterally, adequate air movement, no wheezing, rhonchi or rales. Gastrointestinal. Abdomen soft and non tender Skin. No rashes Musculoskeletal: no joint deformities     Data Reviewed: I have personally reviewed following labs and imaging studies  CBC: Recent Labs  Lab 11/30/20 1801 12/01/20 1809 12/02/20 0527  WBC 9.8 6.8 4.9  NEUTROABS 8.4* 5.7  --   HGB 13.1 12.2 10.6*  HCT 37.4 35.2* 31.3*  MCV 88.0 90.5 92.1  PLT 149* 142* 433*   Basic Metabolic Panel: Recent Labs  Lab 11/30/20 1801 12/01/20 1731 12/01/20 1809 12/02/20 0527  NA 135  --  138 140  K 3.1*  --  3.0* 3.4*  CL 105  --  105 110  CO2 20*  --  26 29  GLUCOSE 166*  --  145* 112*  BUN 25*  --  23 19  CREATININE 1.32*  --  1.07* 1.19*  CALCIUM 9.2  --  9.0 8.5*  MG  --  2.3  --   --    GFR: Estimated Creatinine Clearance: 35.8 mL/min (A) (by C-G formula based on SCr of 1.19 mg/dL (H)). Liver Function Tests: Recent Labs  Lab 11/30/20 1801 12/01/20 1809  AST 51* 32  ALT 59* 49*  ALKPHOS 78 86  BILITOT 1.8* 1.2  PROT 6.7 6.3*  ALBUMIN 3.8 3.4*   No results for input(s): LIPASE, AMYLASE in the last 168 hours. No results for input(s): AMMONIA in the last 168 hours. Coagulation Profile: Recent Labs  Lab 12/01/20 1809  INR 1.0   Cardiac Enzymes: No results for input(s): CKTOTAL, CKMB,  CKMBINDEX, TROPONINI in the last 168 hours. BNP (last 3 results) No results for input(s): PROBNP in the last 8760 hours. HbA1C: No results for input(s): HGBA1C in the last 72 hours. CBG: No results for input(s): GLUCAP in the last 168 hours. Lipid Profile: No results for input(s): CHOL, HDL, LDLCALC, TRIG, CHOLHDL, LDLDIRECT in the last 72 hours. Thyroid Function Tests: No results for input(s): TSH, T4TOTAL, FREET4, T3FREE, THYROIDAB in the last 72 hours. Anemia Panel: No results for input(s): VITAMINB12, FOLATE, FERRITIN, TIBC, IRON, RETICCTPCT in the last 72 hours.    Radiology Studies: I have reviewed all of the imaging during this hospital visit personally     Scheduled Meds: . aspirin EC  81 mg Oral Daily  .  enoxaparin (LOVENOX) injection  40 mg Subcutaneous Q24H  . furosemide  20 mg Oral Daily  . lisinopril  20 mg Oral Daily  . pantoprazole  40 mg Oral Daily   Continuous Infusions: . cefTRIAXone (ROCEPHIN)  IV       LOS: 1 day        Tiffiney Sparrow Gerome Apley, MD

## 2020-12-02 NOTE — Telephone Encounter (Signed)
FYI

## 2020-12-03 LAB — CBC WITH DIFFERENTIAL/PLATELET
Abs Immature Granulocytes: 0.01 10*3/uL (ref 0.00–0.07)
Basophils Absolute: 0 10*3/uL (ref 0.0–0.1)
Basophils Relative: 0 %
Eosinophils Absolute: 0.1 10*3/uL (ref 0.0–0.5)
Eosinophils Relative: 3 %
HCT: 34.4 % — ABNORMAL LOW (ref 36.0–46.0)
Hemoglobin: 11.2 g/dL — ABNORMAL LOW (ref 12.0–15.0)
Immature Granulocytes: 0 %
Lymphocytes Relative: 22 %
Lymphs Abs: 0.9 10*3/uL (ref 0.7–4.0)
MCH: 30.7 pg (ref 26.0–34.0)
MCHC: 32.6 g/dL (ref 30.0–36.0)
MCV: 94.2 fL (ref 80.0–100.0)
Monocytes Absolute: 0.5 10*3/uL (ref 0.1–1.0)
Monocytes Relative: 12 %
Neutro Abs: 2.5 10*3/uL (ref 1.7–7.7)
Neutrophils Relative %: 63 %
Platelets: 144 10*3/uL — ABNORMAL LOW (ref 150–400)
RBC: 3.65 MIL/uL — ABNORMAL LOW (ref 3.87–5.11)
RDW: 12.5 % (ref 11.5–15.5)
WBC: 4 10*3/uL (ref 4.0–10.5)
nRBC: 0 % (ref 0.0–0.2)

## 2020-12-03 LAB — URINE CULTURE: Culture: >=100000 — AB

## 2020-12-03 LAB — CULTURE, BLOOD (ROUTINE X 2)
Special Requests: ADEQUATE
Special Requests: ADEQUATE

## 2020-12-03 LAB — BASIC METABOLIC PANEL
Anion gap: 4 — ABNORMAL LOW (ref 5–15)
BUN: 16 mg/dL (ref 8–23)
CO2: 27 mmol/L (ref 22–32)
Calcium: 9 mg/dL (ref 8.9–10.3)
Chloride: 111 mmol/L (ref 98–111)
Creatinine, Ser: 1.1 mg/dL — ABNORMAL HIGH (ref 0.44–1.00)
GFR, Estimated: 50 mL/min — ABNORMAL LOW (ref >60)
Glucose, Bld: 102 mg/dL — ABNORMAL HIGH (ref 70–99)
Potassium: 3 mmol/L — ABNORMAL LOW (ref 3.5–5.1)
Sodium: 142 mmol/L (ref 135–145)

## 2020-12-03 MED ORDER — POTASSIUM CHLORIDE CRYS ER 20 MEQ PO TBCR
40.0000 meq | EXTENDED_RELEASE_TABLET | Freq: Once | ORAL | Status: AC
  Administered 2020-12-03: 40 meq via ORAL
  Filled 2020-12-03: qty 2

## 2020-12-03 MED ORDER — CEFDINIR 300 MG PO CAPS: 300.0000 mg | ORAL_CAPSULE | Freq: Two times a day (BID) | ORAL | 0 refills | Status: AC

## 2020-12-03 NOTE — Discharge Summary (Addendum)
Physician Discharge Summary  Michelle [name] [id number] DOB: February 28, 1941 DOA: 12/01/2020  PCP: Ronnald Nian, DO  Admit date: 12/01/2020 Discharge date: 12/03/2020  Admitted From: Home  Disposition:  Home   Recommendations for Outpatient Follow-up and new medication changes:  1. Follow up with Dr. Bryan Lemma in 7 to 10 days.  2. Continue Cefdinir until 12/12/20. 3. Outpatient referral to Neurology for confusion.   Home Health: no   Equipment/Devices: na    Discharge Condition: stable  CODE STATUS: full   Diet recommendation:  Heart healthy   Brief/Interim Summary: Michelle [name] was admitted to the hospital working diagnosis of urinary tract infection complicated with E. coli bacteremia  80 year old female past medical history for hypertension, history of fibromuscular dysplasia of the renal artery, bilateral macular degeneration, chronic hallucinations/ confusion who presented with acute onset of weakness, slurred speech and presyncope episode.  Initially evaluated in the ED, diagnosed with urinary tract infection, received intravenous fluids and IV antibiotics.  She felt better and was discharged home. Her urine culture became positive for E. coli and she was called back to come to the hospital.  On her initial physical examination blood pressure 148/65, heart rate 67, respiratory rate 18, temperature 100.15F, oxygen saturation 98%, her lungs were clear to auscultation bilaterally, heart S1-S2, present rhythmic, soft abdomen, no lower extremity edema.  Neurologically she was nonfocal.  Sodium 138, potassium 3.0, chloride 105, bicarb 26, glucose 145, BUN 23, creatinine 1.0, white count 6.8, hemoglobin 12.2, hematocrit 35.2, platelets 142. SARS COVID-19 negative.  Urinalysis specific gravity 1.009, 0-5 red cells,> 50 white cells.  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  No acute cervical spine changes. Chest radiograph no infiltrates.  EKG 69 bpm, left  axis deviation, left anterior fascicular block, sinus rhythm, no ST segment or T wave changes.  1.  Gram-negative bacteremia due to E. coli urine infection, present on admission.  No sepsis.  Patient was admitted to the medical ward, she received intravenous antibiotic therapy with ceftriaxone, repeat blood cultures are no growth.  E. coli was resistant to ampicillin, ampicillin sulbactam, sensitive to cephalosporins and fluoroquinolones. Patient will continue oral cefdinir for 9 more days, complete Dec 13, 2019.  2.  Macular degeneration, chronic hallucinations and confusion.  Patient need frequent reorientation during her hospitalization.  She had frequent neurochecks. 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  Mild focal mass-effect.  Patient will referred for outpatient neurology evaluation. Continue as needed clonazepam.  3.  Hypertension.  Continue blood pressure control with lisinopril, considering her advanced age we will hold on diuretic therapy that may trigger dehydration and electrolyte disturbances.  4.  Acute kidney injury/hypokalemia.  Patient received supportive medical therapy including intravenous fluids, her discharge creatinine is 1.1, sodium 142, and potassium 3.0. She will receive oral potassium chloride before discharge, discontinue furosemide.    Discharge Diagnoses:  Principal Problem:   Bacteremia Active Problems:   Essential hypertension, benign   Macular degeneration of both eyes   Hypokalemia    Discharge Instructions  Discharge Instructions    Ambulatory referral to Neurology   Complete by: As directed    An appointment is requested in approximately: 1 week. Post hospitalization follow up, confusion and disorientation.   Diet - low sodium heart healthy   Complete by: As  directed    Increase activity slowly   Complete by: As directed      Allergies as of 12/03/2020      Reactions   Iodinated Diagnostic Agents Hives   Patient believes this was an older form of iodinated contrast.  Tolerated OMNIPAQUE IOHEXOL 11/19/14 for renal arteriogram/angioplasty without prep. Larina EarthlyJ. Lohr, RN   [facility]    Diphenhydramine Hcl Hives      Medication List    STOP taking these medications   furosemide 20 MG tablet Commonly known as: LASIX     TAKE these medications   aspirin EC 81 MG tablet Take 81 mg by mouth daily.   beta carotene w/minerals tablet Take 1 tablet by mouth daily.   Bevacizumab 100 MG/4ML Soln Commonly known as: AVASTIN Inject 1-10 mg into the eye See admin instructions. For eyes One every 6 weeks 1/10 mg   cefdinir 300 MG capsule Commonly known as: OMNICEF Take 1 capsule (300 mg total) by mouth 2 (two) times daily for 8 days. Start date 12/04/20 and complete on 12/12/20. Start taking on: Dec 04, 2020 What changed: additional instructions   cetirizine 10 MG tablet Commonly known as: ZYRTEC Take 10 mg by mouth daily as needed for allergies.   clonazePAM 0.5 MG tablet Commonly known as: KLONOPIN Take 1 tablet (0.5 mg total) by mouth 2 (two) times daily as needed for anxiety. 1/2 to 1 tab po BID PRN What changed: additional instructions   lisinopril 20 MG tablet Commonly known as: ZESTRIL Take 20 mg by mouth daily.   omeprazole 20 MG capsule Commonly known as: PRILOSEC Take 20 mg by mouth daily before breakfast.       Follow-up Information    Luana ShuCirigliano, Mary K, DO Follow up in 1 week(s).   Specialty: Family Medicine Contact information: [address] [place] [address] [phone]              Allergies  Allergen Reactions  . Iodinated Diagnostic Agents Hives    Patient believes this was an older form of iodinated contrast.  Tolerated OMNIPAQUE IOHEXOL 11/19/14 for renal arteriogram/angioplasty without  prep. Larina EarthlyJ. Lohr, RN  . [facility]   . Diphenhydramine Hcl Hives        Procedures/Studies: CT Head Wo Contrast  Result Date: 11/30/2020 CLINICAL DATA:  Fall versus pre syncopal episode earlier today. EXAM: CT HEAD WITHOUT CONTRAST CT CERVICAL SPINE WITHOUT CONTRAST TECHNIQUE: Multidetector CT imaging of the head and cervical spine was performed following the standard protocol without intravenous contrast. Multiplanar CT image reconstructions of the cervical spine were also generated. COMPARISON:  None. 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 This exerts minimal local mass effect on the surrounding structures. No evidence of acute infarction, hemorrhage, hydrocephalus, extra-axial collection, or midline shift. Vascular: No hyperdense vessel or unexpected calcification. Skull: Normal. Negative for fracture or focal lesion. Sinuses/Orbits: No acute finding. Other: None. CT CERVICAL SPINE FINDINGS Alignment: Normal. Skull base and vertebrae: No acute fracture. No primary bone lesion or focal pathologic process. Soft tissues and spinal canal: No prevertebral fluid or swelling. No visible canal hematoma. Disc levels:  Multilevel degenerative disc and joint disease. Upper chest: Negative. Other: None. IMPRESSION: 1. 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 Recommend MR without and with contrast for further evaluation. 2. No acute osseous injury in the cervical spine. Electronically Signed  By: Zerita Boers M.D.   On: 11/30/2020 18:32   CT Cervical Spine Wo Contrast  Result Date: 11/30/2020 CLINICAL DATA:  Fall versus pre syncopal episode earlier today. EXAM: CT HEAD WITHOUT CONTRAST CT CERVICAL SPINE WITHOUT CONTRAST TECHNIQUE: Multidetector CT imaging of the head and cervical spine was performed following the standard protocol without intravenous contrast. Multiplanar CT image  reconstructions of the cervical spine were also generated. COMPARISON:  None. 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 This exerts minimal local mass effect on the surrounding structures. No evidence of acute infarction, hemorrhage, hydrocephalus, extra-axial collection, or midline shift. Vascular: No hyperdense vessel or unexpected calcification. Skull: Normal. Negative for fracture or focal lesion. Sinuses/Orbits: No acute finding. Other: None. CT CERVICAL SPINE FINDINGS Alignment: Normal. Skull base and vertebrae: No acute fracture. No primary bone lesion or focal pathologic process. Soft tissues and spinal canal: No prevertebral fluid or swelling. No visible canal hematoma. Disc levels:  Multilevel degenerative disc and joint disease. Upper chest: Negative. Other: None. IMPRESSION: 1. 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 Recommend MR without and with contrast for further evaluation. 2. No acute osseous injury in the cervical spine. Electronically Signed   By: Zerita Boers M.D.   On: 11/30/2020 18:32   MR BRAIN WO CONTRAST  Result Date: 12/02/2020 CLINICAL DATA:  Calcified pineal region mass on CT. EXAM: MRI HEAD WITHOUT CONTRAST TECHNIQUE: Multiplanar, multiecho pulse sequences of the brain and surrounding structures were obtained without intravenous contrast. COMPARISON:  Head CT 11/30/2020 FINDINGS: Brain: There is no evidence of an acute infarct, intracranial hemorrhage, midline shift, or extra-axial fluid collection. T2 hyperintensities in the cerebral white matter bilaterally are nonspecific but compatible with mild-to-moderate chronic small vessel ischemic disease. The ventricles and sulci are within normal limits for age. As noted on the recent CT, there is a calcified, heterogeneously T2 hypointense extra-axial mass located posterior and superior to the pineal gland  which measures 1.5 x 1.0 cm. There is mild mass effect on the splenium of the corpus callosum without other regional mass effect or brain edema. A 1.1 cm cyst is noted in the pineal gland itself. Vascular: Major intracranial vascular flow voids are preserved. Skull and upper cervical spine: Unremarkable bone marrow signal. Sinuses/Orbits: Bilateral cataract extraction. Paranasal sinuses and mastoid air cells are clear. Other: None. IMPRESSION: 1. 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 Mild local mass effect without brain edema. 2. Mild-to-moderate chronic small vessel ischemic disease. Electronically Signed   By: Logan Bores M.D.   On: 12/02/2020 19:30   DG Chest Port 1 View  Result Date: 11/30/2020 CLINICAL DATA:  Fall versus pre syncopal episode. Concern for infection. EXAM: PORTABLE CHEST 1 VIEW COMPARISON:  None. FINDINGS: The heart size and mediastinal contours are within normal limits. Both lungs are clear. The visualized skeletal structures are unremarkable. IMPRESSION: No active disease. Electronically Signed   By: Zerita Boers M.D.   On: 11/30/2020 17:58        Subjective: Patient is feeling better, last night poor sleep and this am is somnolent but easy to arouse. No agitation.   Discharge Exam: Vitals:   12/02/20 2142 12/03/20 0623  BP: (!) 141/56 (!) 144/68  Pulse: (!) 44 (!) 55  Resp: 17 17  Temp: 98.5 F (36.9 C) 99.2 F (37.3 C)  SpO2: 96% 97%   Vitals:   12/02/20 0932 12/02/20 1421 12/02/20 2142 12/03/20 0623  BP: (!) 141/61 (!) 142/60 (!) 141/56 (!) 144/68  Pulse: (!) 54 (!) 48 (!) 44 (!) 55  Resp: 19 18 17 17   Temp: 97.8 F (36.6 C) 97.8 F (36.6 C) 98.5 F (36.9 C) 99.2 F (37.3 C)  TempSrc: Oral Oral Oral Oral  SpO2: 99% 99% 96% 97%  Weight:    73.9 kg  Height:        General: Not in pain or dyspnea.   Neurology: somnolent but easy to arouse.  E ENT: no pallor, no icterus, oral mucosa moist Cardiovascular: No JVD. S1-S2 present, rhythmic, no gallops, rubs, or murmurs. No lower extremity edema. Pulmonary: vesicular breath sounds bilaterally, adequate air movement, no wheezing, rhonchi or rales. Gastrointestinal. Abdomen soft and non tender Skin. No rashes Musculoskeletal: no joint deformities   The results of significant diagnostics from this hospitalization (including imaging, microbiology, ancillary and laboratory) are listed below for reference.     Microbiology: Recent Results (from the past 240 hour(s))  Urine culture     Status: Abnormal   Collection Time: 11/30/20  5:23 PM   Specimen: Urine, Random  Result Value Ref Range Status   Specimen Description   Final    URINE, RANDOM Performed at [facility] [address], [place], [place] [address]    Special Requests   Final    NONE Performed at [facility], [place] [address] [place], [place] [address]    Culture >=100,000 COLONIES/mL ESCHERICHIA COLI (A)  Final   Report Status 12/03/2020 FINAL  Final   Organism ID, Bacteria ESCHERICHIA COLI (A)  Final      Susceptibility   Escherichia coli - MIC*    AMPICILLIN >=32 RESISTANT Resistant     CEFAZOLIN <=4 SENSITIVE Sensitive     CEFEPIME <=0.12 SENSITIVE Sensitive     CEFTRIAXONE <=0.25 SENSITIVE Sensitive     CIPROFLOXACIN <=0.25 SENSITIVE Sensitive     GENTAMICIN <=1 SENSITIVE Sensitive     IMIPENEM <=0.25 SENSITIVE Sensitive     NITROFURANTOIN <=16 SENSITIVE Sensitive     TRIMETH/SULFA <=20 SENSITIVE Sensitive     AMPICILLIN/SULBACTAM >=32 RESISTANT Resistant     PIP/TAZO <=4 SENSITIVE Sensitive     * >=100,000 COLONIES/mL ESCHERICHIA COLI  Culture, blood (routine x 2)     Status: Abnormal   Collection Time: 11/30/20  5:29 PM   Specimen: BLOOD  Result Value Ref Range Status   Specimen Description   Final    BLOOD LEFT  ANTECUBITAL Performed at [facility] [address], [place], [place] [address]    Special Requests   Final    BOTTLES DRAWN AEROBIC AND ANAEROBIC Blood Culture adequate volume Performed at [facility] [address], [place], [place] [address]    Culture  Setup Time   Final    GRAM NEGATIVE RODS AEROBIC BOTTLE ONLY CRITICAL VALUE NOTED.  VALUE IS CONSISTENT WITH PREVIOUSLY REPORTED AND CALLED VALUE.    Culture (A)  Final    ESCHERICHIA COLI SUSCEPTIBILITIES PERFORMED ON PREVIOUS CULTURE WITHIN THE LAST 5 DAYS. Performed at [facility], [place] [address], [place], [place] [address]    Report Status 12/03/2020 FINAL  Final  Culture, blood (routine x 2)     Status: Abnormal   Collection Time: 11/30/20  6:01 PM   Specimen:  BLOOD  Result Value Ref Range Status   Specimen Description   Final    BLOOD LEFT ANTECUBITAL Performed at [facility] [address], [place], [place] [address]    Special Requests   Final    BOTTLES DRAWN AEROBIC AND ANAEROBIC Blood Culture adequate volume Performed at [facility] [address] [place], [place] [address]    Culture  Setup Time   Final    GRAM NEGATIVE RODS AEROBIC BOTTLE ONLY CRITICAL RESULT CALLED TO, READ BACK BY AND VERIFIED WITH: Mickie Hillier RN @1308  12/01/20 EB Performed at [facility] [address], [place], [place] [address]    Culture ESCHERICHIA COLI (A)  Final   Report Status 12/03/2020 FINAL  Final   Organism ID, Bacteria ESCHERICHIA COLI  Final      Susceptibility   Escherichia coli - MIC*    AMPICILLIN >=32 RESISTANT Resistant     CEFAZOLIN <=4 SENSITIVE Sensitive     CEFEPIME <=0.12 SENSITIVE Sensitive     CEFTAZIDIME <=1 SENSITIVE Sensitive     CEFTRIAXONE <=0.25 SENSITIVE Sensitive     CIPROFLOXACIN <=0.25 SENSITIVE Sensitive     GENTAMICIN <=1 SENSITIVE Sensitive     IMIPENEM <=0.25 SENSITIVE Sensitive     TRIMETH/SULFA  <=20 SENSITIVE Sensitive     AMPICILLIN/SULBACTAM >=32 RESISTANT Resistant     PIP/TAZO <=4 SENSITIVE Sensitive     * ESCHERICHIA COLI  Blood Culture ID Panel (Reflexed)     Status: Abnormal   Collection Time: 11/30/20  6:01 PM  Result Value Ref Range Status   Enterococcus faecalis NOT DETECTED NOT DETECTED Final   Enterococcus Faecium NOT DETECTED NOT DETECTED Final   Listeria monocytogenes NOT DETECTED NOT DETECTED Final   Staphylococcus species NOT DETECTED NOT DETECTED Final   Staphylococcus aureus (BCID) NOT DETECTED NOT DETECTED Final   Staphylococcus epidermidis NOT DETECTED NOT DETECTED Final   Staphylococcus lugdunensis NOT DETECTED NOT DETECTED Final   Streptococcus species NOT DETECTED NOT DETECTED Final   Streptococcus agalactiae NOT DETECTED NOT DETECTED Final   Streptococcus pneumoniae NOT DETECTED NOT DETECTED Final   Streptococcus pyogenes NOT DETECTED NOT DETECTED Final   A.calcoaceticus-baumannii NOT DETECTED NOT DETECTED Final   Bacteroides fragilis NOT DETECTED NOT DETECTED Final   Enterobacterales DETECTED (A) NOT DETECTED Final    Comment: Enterobacterales represent a large order of gram negative bacteria, not a single organism. CRITICAL RESULT CALLED TO, READ BACK BY AND VERIFIED WITH: [place] RN @1308  12/01/20 EB    Enterobacter cloacae complex NOT DETECTED NOT DETECTED Final   Escherichia coli DETECTED (A) NOT DETECTED Final    Comment: CRITICAL RESULT CALLED TO, READ BACK BY AND VERIFIED WITH: [place] RN @1308  12/01/20 EB    Klebsiella aerogenes NOT DETECTED NOT DETECTED Final   Klebsiella oxytoca NOT DETECTED NOT DETECTED Final   Klebsiella pneumoniae NOT DETECTED NOT DETECTED Final   Proteus species NOT DETECTED NOT DETECTED Final   Salmonella species NOT DETECTED NOT DETECTED Final   Serratia marcescens NOT DETECTED NOT DETECTED Final   Haemophilus influenzae NOT DETECTED NOT DETECTED Final   Neisseria meningitidis NOT DETECTED NOT DETECTED  Final   Pseudomonas aeruginosa NOT DETECTED NOT DETECTED Final   Stenotrophomonas maltophilia NOT DETECTED NOT DETECTED Final   Candida albicans NOT DETECTED NOT DETECTED Final   Candida auris NOT DETECTED NOT DETECTED Final   Candida glabrata NOT DETECTED NOT DETECTED Final   Candida krusei NOT DETECTED NOT DETECTED Final  Candida parapsilosis NOT DETECTED NOT DETECTED Final   Candida tropicalis NOT DETECTED NOT DETECTED Final   Cryptococcus neoformans/gattii NOT DETECTED NOT DETECTED Final   CTX-M ESBL NOT DETECTED NOT DETECTED Final   Carbapenem resistance IMP NOT DETECTED NOT DETECTED Final   Carbapenem resistance KPC NOT DETECTED NOT DETECTED Final   Carbapenem resistance NDM NOT DETECTED NOT DETECTED Final   Carbapenem resist OXA 48 LIKE NOT DETECTED NOT DETECTED Final   Carbapenem resistance VIM NOT DETECTED NOT DETECTED Final    Comment: Performed at [facility], [place] [address], [place], [place] [address]  Resp Panel by RT-PCR (Flu A&B, Covid) Nasopharyngeal Swab     Status: None   Collection Time: 11/30/20  6:36 PM   Specimen: Nasopharyngeal Swab; Nasopharyngeal(NP) swabs in vial transport medium  Result Value Ref Range Status   SARS Coronavirus 2 by RT PCR NEGATIVE NEGATIVE Final    Comment: (NOTE) SARS-CoV-2 target nucleic acids are NOT DETECTED.  The SARS-CoV-2 RNA is generally detectable in upper respiratory specimens during the acute phase of infection. The lowest concentration of SARS-CoV-2 viral copies this assay can detect is 138 copies/mL. A negative result does not preclude SARS-Cov-2 infection and should not be used as the sole basis for treatment or other patient management decisions. A negative result may occur with  improper specimen collection/handling, submission of specimen other than nasopharyngeal swab, presence of viral mutation(s) within the areas targeted by this assay, and inadequate number of viral copies(<138 copies/mL). A negative result  must be combined with clinical observations, patient history, and epidemiological information. The expected result is Negative.  Fact Sheet for Patients:  EntrepreneurPulse.com.au  Fact Sheet for Healthcare Providers:  IncredibleEmployment.be  This test is no t yet approved or cleared by the [place] FDA and  has been authorized for detection and/or diagnosis of SARS-CoV-2 by FDA under an Emergency Use Authorization (EUA). This EUA will remain  in effect (meaning this test can be used) for the duration of the COVID-19 declaration under Section 564(b)(1) of the Act, 21 U.S.C.section 360bbb-3(b)(1), unless the authorization is terminated  or revoked sooner.       Influenza A by PCR NEGATIVE NEGATIVE Final   Influenza B by PCR NEGATIVE NEGATIVE Final    Comment: (NOTE) The Xpert Xpress SARS-CoV-2/FLU/RSV plus assay is intended as an aid in the diagnosis of influenza from Nasopharyngeal swab specimens and should not be used as a sole basis for treatment. Nasal washings and aspirates are unacceptable for Xpert Xpress SARS-CoV-2/FLU/RSV testing.  Fact Sheet for Patients: EntrepreneurPulse.com.au  Fact Sheet for Healthcare Providers: IncredibleEmployment.be  This test is not yet approved or cleared by the [place] FDA and has been authorized for detection and/or diagnosis of SARS-CoV-2 by FDA under an Emergency Use Authorization (EUA). This EUA will remain in effect (meaning this test can be used) for the duration of the COVID-19 declaration under Section 564(b)(1) of the Act, 21 U.S.C. section 360bbb-3(b)(1), unless the authorization is terminated or revoked.  Performed at [facility], [place] [address] [place], [place] [address]   Urine culture     Status: Abnormal   Collection Time: 12/01/20  5:45 PM   Specimen: In/Out Cath Urine  Result Value Ref Range Status   Specimen  Description   Final    IN/OUT CATH URINE Performed at [facility] [address], [place],  [address]    Special Requests   Final    NONE Performed at [facility]  [place] [address], [place], [place] [address]    Culture MULTIPLE SPECIES PRESENT, SUGGEST RECOLLECTION (A)  Final   Report Status 12/03/2020 FINAL  Final  Blood Culture (routine x 2)     Status: None (Preliminary result)   Collection Time: 12/01/20  6:09 PM   Specimen: BLOOD  Result Value Ref Range Status   Specimen Description   Final    BLOOD BLOOD LEFT FOREARM Performed at [facility] [address], [place], [place] [address]    Special Requests   Final    BOTTLES DRAWN AEROBIC AND ANAEROBIC Blood Culture adequate volume Performed at [facility] [address] [place], [place] [address]    Culture   Final    NO GROWTH 2 DAYS Performed at [facility] [address], [place], [place] [address]    Report Status PENDING  Incomplete  Blood Culture (routine x 2)     Status: None (Preliminary result)   Collection Time: 12/01/20  6:09 PM   Specimen: BLOOD  Result Value Ref Range Status   Specimen Description   Final    BLOOD RIGHT ANTECUBITAL Performed at [facility] [address], [place], [place] [address]    Special Requests   Final    BOTTLES DRAWN AEROBIC AND ANAEROBIC Blood Culture adequate volume Performed at [facility] [address] [place], [place] [address]    Culture   Final    NO GROWTH 2 DAYS Performed at [facility] [address] [place], [name] [address]    Report Status PENDING  Incomplete     Labs: BNP (last 3 results) No results for input(s): BNP in the last 8760 hours. Basic Metabolic Panel: Recent Labs  Lab 11/30/20 1801 12/01/20 1731 12/01/20 1809 12/02/20 0527 12/03/20 0500  NA 135  --  138 140 142  K 3.1*  --  3.0* 3.4* 3.0*   CL 105  --  105 110 111  CO2 20*  --  26 29 27   GLUCOSE 166*  --  145* 112* 102*  BUN 25*  --  23 19 16   CREATININE 1.32*  --  1.07* 1.19* 1.10*  CALCIUM 9.2  --  9.0 8.5* 9.0  MG  --  2.3  --   --   --    Liver Function Tests: Recent Labs  Lab 11/30/20 1801 12/01/20 1809  AST 51* 32  ALT 59* 49*  ALKPHOS 78 86  BILITOT 1.8* 1.2  PROT 6.7 6.3*  ALBUMIN 3.8 3.4*   No results for input(s): LIPASE, AMYLASE in the last 168 hours. No results for input(s): AMMONIA in the last 168 hours. CBC: Recent Labs  Lab 11/30/20 1801 12/01/20 1809 12/02/20 0527 12/03/20 0500  WBC 9.8 6.8 4.9 4.0  NEUTROABS 8.4* 5.7  --  2.5  HGB 13.1 12.2 10.6* 11.2*  HCT 37.4 35.2* 31.3* 34.4*  MCV 88.0 90.5 92.1 94.2  PLT 149* 142* 138* 144*   Cardiac Enzymes: No results for input(s): CKTOTAL, CKMB, CKMBINDEX, TROPONINI in the last 168 hours. BNP: Invalid input(s): POCBNP CBG: No results for input(s): GLUCAP in the last 168 hours. D-Dimer No results for input(s): DDIMER in the last 72 hours. Hgb A1c No results for input(s): HGBA1C in the last 72 hours. Lipid Profile No results for input(s): CHOL, HDL, LDLCALC, TRIG, CHOLHDL, LDLDIRECT in the last 72 hours. Thyroid function studies No results for input(s): TSH, T4TOTAL, T3FREE,  THYROIDAB in the last 72 hours.  Invalid input(s): FREET3 Anemia work up No results for input(s): VITAMINB12, FOLATE, FERRITIN, TIBC, IRON, RETICCTPCT in the last 72 hours. Urinalysis    Component Value Date/Time   COLORURINE YELLOW 12/01/2020 1745   APPEARANCEUR HAZY (A) 12/01/2020 1745   LABSPEC 1.009 12/01/2020 1745   PHURINE 6.0 12/01/2020 1745   GLUCOSEU NEGATIVE 12/01/2020 1745   HGBUR MODERATE (A) 12/01/2020 1745   HGBUR negative 10/24/2009 0818   BILIRUBINUR NEGATIVE 12/01/2020 1745   BILIRUBINUR n 11/01/2013 1113   KETONESUR NEGATIVE 12/01/2020 1745   PROTEINUR 30 (A) 12/01/2020 1745   UROBILINOGEN 0.2 11/01/2013 1113   UROBILINOGEN 0.2 10/24/2009  0818   NITRITE NEGATIVE 12/01/2020 1745   LEUKOCYTESUR LARGE (A) 12/01/2020 1745   Sepsis Labs Invalid input(s): PROCALCITONIN,  WBC,  LACTICIDVEN Microbiology Recent Results (from the past 240 hour(s))  Urine culture     Status: Abnormal   Collection Time: 11/30/20  5:23 PM   Specimen: Urine, Random  Result Value Ref Range Status   Specimen Description   Final    URINE, RANDOM Performed at [facility], [place] [address], [place], [place] [address]    Special Requests   Final    NONE Performed at [facility], [place] [address], [place], [place] [address]    Culture >=100,000 COLONIES/mL ESCHERICHIA COLI (A)  Final   Report Status 12/03/2020 FINAL  Final   Organism ID, Bacteria ESCHERICHIA COLI (A)  Final      Susceptibility   Escherichia coli - MIC*    AMPICILLIN >=32 RESISTANT Resistant     CEFAZOLIN <=4 SENSITIVE Sensitive     CEFEPIME <=0.12 SENSITIVE Sensitive     CEFTRIAXONE <=0.25 SENSITIVE Sensitive     CIPROFLOXACIN <=0.25 SENSITIVE Sensitive     GENTAMICIN <=1 SENSITIVE Sensitive     IMIPENEM <=0.25 SENSITIVE Sensitive     NITROFURANTOIN <=16 SENSITIVE Sensitive     TRIMETH/SULFA <=20 SENSITIVE Sensitive     AMPICILLIN/SULBACTAM >=32 RESISTANT Resistant     PIP/TAZO <=4 SENSITIVE Sensitive     * >=100,000 COLONIES/mL ESCHERICHIA COLI  Culture, blood (routine x 2)     Status: Abnormal   Collection Time: 11/30/20  5:29 PM   Specimen: BLOOD  Result Value Ref Range Status   Specimen Description   Final    BLOOD LEFT ANTECUBITAL Performed at [facility] [address] [place], [place] [address]    Special Requests   Final    BOTTLES DRAWN AEROBIC AND ANAEROBIC Blood Culture adequate volume Performed at [facility] [address], [place], [place] [address]    Culture  Setup Time   Final    GRAM NEGATIVE RODS AEROBIC BOTTLE ONLY CRITICAL VALUE NOTED.  VALUE IS CONSISTENT WITH PREVIOUSLY  REPORTED AND CALLED VALUE.    Culture (A)  Final    ESCHERICHIA COLI SUSCEPTIBILITIES PERFORMED ON PREVIOUS CULTURE WITHIN THE LAST 5 DAYS. Performed at [facility], [place] [address] [place], [place] [address]    Report Status 12/03/2020 FINAL  Final  Culture, blood (routine x 2)     Status: Abnormal   Collection Time: 11/30/20  6:01 PM   Specimen: BLOOD  Result Value Ref Range Status   Specimen Description   Final    BLOOD LEFT ANTECUBITAL Performed at [facility] [address] [place], [place] [address]    Special Requests   Final    BOTTLES DRAWN AEROBIC  AND ANAEROBIC Blood Culture adequate volume Performed at [facility] [address], [place], [place] [address]    Culture  Setup Time   Final    GRAM NEGATIVE RODS AEROBIC BOTTLE ONLY CRITICAL RESULT CALLED TO, READ BACK BY AND VERIFIED WITH: Mickie Hillier RN @1308  12/01/20 EB Performed at [facility] [address] [place], [place] [address]    Culture ESCHERICHIA COLI (A)  Final   Report Status 12/03/2020 FINAL  Final   Organism ID, Bacteria ESCHERICHIA COLI  Final      Susceptibility   Escherichia coli - MIC*    AMPICILLIN >=32 RESISTANT Resistant     CEFAZOLIN <=4 SENSITIVE Sensitive     CEFEPIME <=0.12 SENSITIVE Sensitive     CEFTAZIDIME <=1 SENSITIVE Sensitive     CEFTRIAXONE <=0.25 SENSITIVE Sensitive     CIPROFLOXACIN <=0.25 SENSITIVE Sensitive     GENTAMICIN <=1 SENSITIVE Sensitive     IMIPENEM <=0.25 SENSITIVE Sensitive     TRIMETH/SULFA <=20 SENSITIVE Sensitive     AMPICILLIN/SULBACTAM >=32 RESISTANT Resistant     PIP/TAZO <=4 SENSITIVE Sensitive     * ESCHERICHIA COLI  Blood Culture ID Panel (Reflexed)     Status: Abnormal   Collection Time: 11/30/20  6:01 PM  Result Value Ref Range Status   Enterococcus faecalis NOT DETECTED NOT DETECTED Final   Enterococcus Faecium NOT DETECTED NOT DETECTED Final   Listeria monocytogenes NOT DETECTED NOT  DETECTED Final   Staphylococcus species NOT DETECTED NOT DETECTED Final   Staphylococcus aureus (BCID) NOT DETECTED NOT DETECTED Final   Staphylococcus epidermidis NOT DETECTED NOT DETECTED Final   Staphylococcus lugdunensis NOT DETECTED NOT DETECTED Final   Streptococcus species NOT DETECTED NOT DETECTED Final   Streptococcus agalactiae NOT DETECTED NOT DETECTED Final   Streptococcus pneumoniae NOT DETECTED NOT DETECTED Final   Streptococcus pyogenes NOT DETECTED NOT DETECTED Final   A.calcoaceticus-baumannii NOT DETECTED NOT DETECTED Final   Bacteroides fragilis NOT DETECTED NOT DETECTED Final   Enterobacterales DETECTED (A) NOT DETECTED Final    Comment: Enterobacterales represent a large order of gram negative bacteria, not a single organism. CRITICAL RESULT CALLED TO, READ BACK BY AND VERIFIED WITH: [place] RN @1308  12/01/20 EB    Enterobacter cloacae complex NOT DETECTED NOT DETECTED Final   Escherichia coli DETECTED (A) NOT DETECTED Final    Comment: CRITICAL RESULT CALLED TO, READ BACK BY AND VERIFIED WITH: [place] RN @1308  12/01/20 EB    Klebsiella aerogenes NOT DETECTED NOT DETECTED Final   Klebsiella oxytoca NOT DETECTED NOT DETECTED Final   Klebsiella pneumoniae NOT DETECTED NOT DETECTED Final   Proteus species NOT DETECTED NOT DETECTED Final   Salmonella species NOT DETECTED NOT DETECTED Final   Serratia marcescens NOT DETECTED NOT DETECTED Final   Haemophilus influenzae NOT DETECTED NOT DETECTED Final   Neisseria meningitidis NOT DETECTED NOT DETECTED Final   Pseudomonas aeruginosa NOT DETECTED NOT DETECTED Final   Stenotrophomonas maltophilia NOT DETECTED NOT DETECTED Final   Candida albicans NOT DETECTED NOT DETECTED Final   Candida auris NOT DETECTED NOT DETECTED Final   Candida glabrata NOT DETECTED NOT DETECTED Final   Candida krusei NOT DETECTED NOT DETECTED Final   Candida parapsilosis NOT DETECTED NOT DETECTED Final   Candida tropicalis NOT DETECTED NOT  DETECTED Final   Cryptococcus neoformans/gattii NOT DETECTED NOT DETECTED Final   CTX-M ESBL NOT DETECTED NOT DETECTED Final   Carbapenem resistance IMP NOT DETECTED NOT DETECTED Final  Carbapenem resistance KPC NOT DETECTED NOT DETECTED Final   Carbapenem resistance NDM NOT DETECTED NOT DETECTED Final   Carbapenem resist OXA 48 LIKE NOT DETECTED NOT DETECTED Final   Carbapenem resistance VIM NOT DETECTED NOT DETECTED Final    Comment: Performed at [facility], [place] [address] [place], [place] [address]  Resp Panel by RT-PCR (Flu A&B, Covid) Nasopharyngeal Swab     Status: None   Collection Time: 11/30/20  6:36 PM   Specimen: Nasopharyngeal Swab; Nasopharyngeal(NP) swabs in vial transport medium  Result Value Ref Range Status   SARS Coronavirus 2 by RT PCR NEGATIVE NEGATIVE Final    Comment: (NOTE) SARS-CoV-2 target nucleic acids are NOT DETECTED.  The SARS-CoV-2 RNA is generally detectable in upper respiratory specimens during the acute phase of infection. The lowest concentration of SARS-CoV-2 viral copies this assay can detect is 138 copies/mL. A negative result does not preclude SARS-Cov-2 infection and should not be used as the sole basis for treatment or other patient management decisions. A negative result may occur with  improper specimen collection/handling, submission of specimen other than nasopharyngeal swab, presence of viral mutation(s) within the areas targeted by this assay, and inadequate number of viral copies(<138 copies/mL). A negative result must be combined with clinical observations, patient history, and epidemiological information. The expected result is Negative.  Fact Sheet for Patients:  EntrepreneurPulse.com.au  Fact Sheet for Healthcare Providers:  IncredibleEmployment.be  This test is no t yet approved or cleared by the [place] FDA and  has been authorized for detection and/or diagnosis of SARS-CoV-2  by FDA under an Emergency Use Authorization (EUA). This EUA will remain  in effect (meaning this test can be used) for the duration of the COVID-19 declaration under Section 564(b)(1) of the Act, 21 U.S.C.section 360bbb-3(b)(1), unless the authorization is terminated  or revoked sooner.       Influenza A by PCR NEGATIVE NEGATIVE Final   Influenza B by PCR NEGATIVE NEGATIVE Final    Comment: (NOTE) The Xpert Xpress SARS-CoV-2/FLU/RSV plus assay is intended as an aid in the diagnosis of influenza from Nasopharyngeal swab specimens and should not be used as a sole basis for treatment. Nasal washings and aspirates are unacceptable for Xpert Xpress SARS-CoV-2/FLU/RSV testing.  Fact Sheet for Patients: EntrepreneurPulse.com.au  Fact Sheet for Healthcare Providers: IncredibleEmployment.be  This test is not yet approved or cleared by the [place] FDA and has been authorized for detection and/or diagnosis of SARS-CoV-2 by FDA under an Emergency Use Authorization (EUA). This EUA will remain in effect (meaning this test can be used) for the duration of the COVID-19 declaration under Section 564(b)(1) of the Act, 21 U.S.C. section 360bbb-3(b)(1), unless the authorization is terminated or revoked.  Performed at [facility], [place] [address] [place], [place] [address]   Urine culture     Status: Abnormal   Collection Time: 12/01/20  5:45 PM   Specimen: In/Out Cath Urine  Result Value Ref Range Status   Specimen Description   Final    IN/OUT CATH URINE Performed at [facility] [address] [place], [place] [address]    Special Requests   Final    NONE Performed at [facility], [place] [address] [place], [place] [address]    Culture MULTIPLE SPECIES PRESENT, SUGGEST RECOLLECTION (A)  Final   Report Status 12/03/2020 FINAL  Final  Blood Culture (routine x 2)     Status: None  (Preliminary result)  Collection Time: 12/01/20  6:09 PM   Specimen: BLOOD  Result Value Ref Range Status   Specimen Description   Final    BLOOD BLOOD LEFT FOREARM Performed at [facility] [address] [place], [place] [address]    Special Requests   Final    BOTTLES DRAWN AEROBIC AND ANAEROBIC Blood Culture adequate volume Performed at [facility] [address], [place], [place] [address]    Culture   Final    NO GROWTH 2 DAYS Performed at [facility] [address], [place], [place] [address]    Report Status PENDING  Incomplete  Blood Culture (routine x 2)     Status: None (Preliminary result)   Collection Time: 12/01/20  6:09 PM   Specimen: BLOOD  Result Value Ref Range Status   Specimen Description   Final    BLOOD RIGHT ANTECUBITAL Performed at [facility] [address] [place], [place] [address]    Special Requests   Final    BOTTLES DRAWN AEROBIC AND ANAEROBIC Blood Culture adequate volume Performed at [facility] [address], [place], [place] [address]    Culture   Final    NO GROWTH 2 DAYS Performed at [facility] [address] [place], [place] [address]    Report Status PENDING  Incomplete     Time coordinating discharge: 45 minutes  SIGNED:   Tawni Millers, MD  [facility] 12/03/2020, 10:59 AM

## 2020-12-04 ENCOUNTER — Telehealth: Payer: Self-pay | Admitting: Emergency Medicine

## 2020-12-04 ENCOUNTER — Telehealth: Payer: Self-pay

## 2020-12-04 NOTE — Telephone Encounter (Signed)
Post ED Visit - Positive Culture Follow-up  Culture report reviewed by antimicrobial stewardship pharmacist: [facility] Team []  Elenor Quinones, Pharm.D. []  Heide Guile, Pharm.D., BCPS AQ-ID []  Parks Neptune, Pharm.D., BCPS []  Alycia Rossetti, Pharm.D., BCPS []  [place], Pharm.D., BCPS, AAHIVP []  Legrand Como, Pharm.D., BCPS, AAHIVP []  Salome Arnt, PharmD, BCPS []  Johnnette Gourd, PharmD, BCPS []  Hughes Better, PharmD, BCPS []  Leeroy Cha, PharmD []  Laqueta Linden, PharmD, BCPS []  Albertina Parr, PharmD  [employer] Team []  Leodis Sias, PharmD []  Lindell Spar, PharmD []  Royetta Asal, PharmD []  Graylin Shiver, Rph []  Rema Fendt) Glennon Mac, PharmD []  Arlyn Dunning, PharmD []  Netta Cedars, PharmD []  Dia Sitter, PharmD []  Leone Haven, PharmD []  Gretta Arab, PharmD []  Theodis Shove, PharmD []  Peggyann Juba, PharmD []  Reuel Boom, PharmD   Positive urine culture Treated with cefdinir, organism sensitive to the same and no further patient follow-up is required at this time.  Hazle Nordmann 12/04/2020, 11:05 AM

## 2020-12-04 NOTE — Telephone Encounter (Signed)
Transition Care Management Follow-up Telephone Call  Date of discharge and from where: 12/03/20-[facility]  How have you been since you were released from the hospital? Per husband, doing ok. just a little tired.  Any questions or concerns? No  Items Reviewed:  Did the pt receive and understand the discharge instructions provided? Yes   Medications obtained and verified? Yes   Other? Yes   Any new allergies since your discharge? No   Dietary orders reviewed? Yes  Do you have support at home? Yes   Home Care and Equipment/Supplies: Were home health services ordered? no If so, what is the name of the agency? n/a  Has the agency set up a time to come to the patient's home? not applicable Were any new equipment or medical supplies ordered?  No What is the name of the medical supply agency? n/a Were you able to get the supplies/equipment? not applicable Do you have any questions related to the use of the equipment or supplies? n/a  Functional Questionnaire: (I = Independent and D = Dependent) ADLs: I  Bathing/Dressing- I with assistance  Meal Prep- D  Eating- I  Maintaining continence- I  Transferring/Ambulation- I  Managing Meds- D  Follow up appointments reviewed:   PCP Hospital f/u appt confirmed? Yes  Scheduled to see Dr. Bryan Lemma on 12/12/20 @ 11:00.  [facility] f/u appt confirmed? No  Patient is to have a neurology referral from PCP  Are transportation arrangements needed? No   If their condition worsens, is the pt aware to call PCP or go to the Emergency Dept.? Yes  Was the patient provided with contact information for the PCP's office or ED? Yes  Was to pt encouraged to call back with questions or concerns? Yes

## 2020-12-06 LAB — CULTURE, BLOOD (ROUTINE X 2)
Culture: NO GROWTH
Culture: NO GROWTH
Special Requests: ADEQUATE
Special Requests: ADEQUATE

## 2020-12-09 ENCOUNTER — Encounter: Payer: Self-pay | Admitting: Neurology

## 2020-12-09 ENCOUNTER — Other Ambulatory Visit: Payer: Self-pay | Admitting: Neurology

## 2020-12-09 ENCOUNTER — Ambulatory Visit (INDEPENDENT_AMBULATORY_CARE_PROVIDER_SITE_OTHER): Payer: Medicare HMO | Admitting: Neurology

## 2020-12-09 VITALS — BP 190/83 | HR 76 | Ht 63.0 in | Wt 159.4 lb

## 2020-12-09 DIAGNOSIS — G9389 Other specified disorders of brain: Secondary | ICD-10-CM

## 2020-12-09 DIAGNOSIS — R441 Visual hallucinations: Secondary | ICD-10-CM

## 2020-12-09 DIAGNOSIS — G3184 Mild cognitive impairment, so stated: Secondary | ICD-10-CM | POA: Diagnosis not present

## 2020-12-09 DIAGNOSIS — R413 Other amnesia: Secondary | ICD-10-CM

## 2020-12-09 MED ORDER — DONEPEZIL HCL 5 MG PO TABS: 5.0000 mg | ORAL_TABLET | Freq: Every day | ORAL | 3 refills | Status: DC

## 2020-12-09 NOTE — Patient Instructions (Signed)
I had a long discussion with the patient and her husband regarding her recent episode of confusion and disorientation as well as short-term memory difficulties which likely represent mild cognitive impairment.  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  I do not believe doing an MRI with contrast is likely going to change treatment plan and she will defer this for now.  Check dementia panel labs, EEG.  Trial of Aricept 5 mg daily for a month to increase if tolerated to 10 mg if no side effects.  Patient was also advised to increase participation in cognitively challenging activities like solving crossword puzzles, playing bridge and sodoku.  We also discussed memory compensation strategies.  She will return for follow-up in the future in 3 months or call earlier if necessary. Memory Compensation Strategies  1. Use "WARM" strategy.  W= write it down  A= associate it  R= repeat it  M= make a mental note  2.   You can keep a Social worker.  Use a 3-ring notebook with sections for the following: calendar, important names and phone numbers,  medications, doctors' names/phone numbers, lists/reminders, and a section to journal what you did  each day.   3.    Use a calendar to write appointments down.  4.    Write yourself a schedule for the day.  This can be placed on the calendar or in a separate section of the Memory Notebook.  Keeping a  regular schedule can help memory.  5.    Use medication organizer with sections for each day or morning/evening pills.  You may need help loading it  6.    Keep a basket, or pegboard by the door.  Place items that you need to take out with you in the basket or on the pegboard.  You may also want to   include a message board for reminders.  7.    Use sticky notes.  Place sticky notes with reminders in a place where the task is performed.  For example: " turn off the  stove" placed by the stove, "lock the door" placed on the door at eye level, " take your medications" on  the bathroom mirror or by the place where you normally take your medications.  8.    Use alarms/timers.  Use while cooking to remind yourself to check on food or as a reminder to take your medicine, or as a  reminder to make a call, or as a reminder to perform another task, etc.

## 2020-12-09 NOTE — Progress Notes (Signed)
[facility] [address] [place]. [place] [address] [phone]       OFFICE CONSULT NOTE  Ms. Michelle [name] Date of Birth:  1941-07-01 Medical Record Number:  [id number]   Referring MD: Michelle [name]  Reason for Referral: Confusion  HPI: Michelle [name] is a 80 year old Caucasian lady seen today for initial office consultation visit.  History is obtained from the patient and her husband who is accompanying her today as well as review of electronic medical records and I personally reviewed pertinent available imaging films in PACS.  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  She was recently admitted to [facility] on 12/01/2020 with E. coli bacteremia and at that time was found to have altered mental status with confusion disorientation.  She also had transient weakness and slurred speech and presyncopal symptoms.  She was treated with IV Rocephin and discharged on cefdinir.  CT scan of the head showed incidental finding of a calcified mass in the pineal region.  She felt better on antibiotics and was discharged home.  When her urine culture came back positive for E. coli she was called back to come to the hospital.  She was referred to neurology for evaluation for abnormal CT scan.  An MRI scan of the brain was obtained without contrast on 12/02/2020 which I personally reviewed and shows a heterogeneous calcified T2 hypointense extra-axial mass located posterior and superior to the pineal gland measuring 1.5 into 1 cm with mild mass-effect on the splenium of corpus callosum with a 1.1 cm cyst in the pineal gland itself.  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  The patient and her  husband's main complaint today is about her short-term memory loss which she has had for about a year.  She has trouble remembering recent information and forgets how to write things down.  She is aware of functional quite independent and manages her own affairs well.  The patient is also had formed fixed visual hallucinations and sees people's in the room.  This has been attributed to her severe macular degeneration and she has seen ophthalmologist Dr. Zigmund [name] who is diagnosed her with Michelle [name] syndrome.  Patient is functionally blind and can barely see objects at 2 to 3 feet distance.  She knows the hallucinations are not real and does get a little anxious.  She has been started on Klonopin which seems to help her anxiety.  She denies any headache episodes of prior stroke, TIA, seizures or loss of consciousness.  There is no family history of dementia.  Her cognitive impairment testing was limited today due to her severe visual impairment but the head 3 word recall was diminished at 0/3 and she was able to name only 7 animals which can walk on 4 legs.  ROS:   14 system review of systems is positive for poor vision, visual hallucinations, memory loss, gait difficulties, bruising and all other systems negative  PMH:  Past Medical History:  Diagnosis Date  . Allergic rhinitis   . Cystocele   . Fibromuscular dysplasia of renal artery ([place])   . Hypertension    fibromuscular dysplasia  . Osteoarthritis   . Osteoporosis   . PMS (premenstrual syndrome)   . Retinal hemorrhage, right    with  macular degeneration    Social History:  Social History   Socioeconomic History  . Marital status: Married    Spouse name: Michelle [name]  . Number of children: Not on file  . Years of education: Not on file  . Highest education level: Not on file  Occupational History  . Not on file  Tobacco Use  . Smoking status: Former Research scientist (life sciences)  . Smokeless tobacco: Former Systems developer    Quit date: 10/27/1983  Substance and  Sexual Activity  . Alcohol use: No  . Drug use: No  . Sexual activity: Not on file  Other Topics Concern  . Not on file  Social History Narrative   Lives with husband at home   Right Handed   Drinks 4-6 cups caffeine daily   Social Determinants of Health   Financial Resource Strain: Low Risk   . Difficulty of Paying Living Expenses: Not hard at all  Food Insecurity: No Food Insecurity  . Worried About Charity fundraiser in the Last Year: Never true  . Ran Out of Food in the Last Year: Never true  Transportation [name]: No Transportation [name]  . Lack of Transportation (Medical): No  . Lack of Transportation (Non-Medical): No  Physical Activity: Sufficiently Active  . Days of Exercise per Week: 7 days  . Minutes of Exercise per Session: 30 min  Stress: No Stress Concern Present  . Feeling of Stress : Not at all  Social Connections: Moderately Isolated  . Frequency of Communication with Friends and Family: More than three times a week  . Frequency of Social Gatherings with Friends and Family: Once a week  . Attends Religious Services: Never  . Active Member of Clubs or Organizations: No  . Attends Archivist Meetings: Never  . Marital Status: Married  Human resources officer Violence: Not At Risk  . Fear of Current or Ex-Partner: No  . Emotionally Abused: No  . Physically Abused: No  . Sexually Abused: No    Medications:   Current Outpatient Medications on File Prior to Visit  Medication Sig Dispense Refill  . aspirin EC 81 MG tablet Take 81 mg by mouth daily.    . beta carotene w/minerals (OCUVITE) tablet Take 1 tablet by mouth daily.    . Bevacizumab (AVASTIN) 100 MG/4ML SOLN Inject 1-10 mg into the eye See admin instructions. For eyes One every 6 weeks 1/10 mg    . cefdinir (OMNICEF) 300 MG capsule Take 1 capsule (300 mg total) by mouth 2 (two) times daily for 8 days. Start date 12/04/20 and complete on 12/12/20. 16 capsule 0  . cetirizine (ZYRTEC) 10 MG tablet  Take 10 mg by mouth daily as needed for allergies.    . clonazePAM (KLONOPIN) 0.5 MG tablet Take 1 tablet (0.5 mg total) by mouth 2 (two) times daily as needed for anxiety. 1/2 to 1 tab po BID PRN (Patient taking differently: Take 0.5 mg by mouth 2 (two) times daily as needed for anxiety.) 60 tablet 2  . lisinopril (PRINIVIL,ZESTRIL) 20 MG tablet Take 20 mg by mouth daily.    Marland Kitchen omeprazole (PRILOSEC) 20 MG capsule Take 20 mg by mouth daily before breakfast.     No current facility-administered medications on file prior to visit.    Allergies:   Allergies  Allergen Reactions  . Iodinated Diagnostic Agents Hives    Patient believes this was an older form of iodinated contrast.  Tolerated OMNIPAQUE IOHEXOL 11/19/14 for renal arteriogram/angioplasty without prep. Michelle Lore, RN  .  [place]   . Diphenhydramine Hcl Hives    Physical Exam General: Frail elderly Caucasian lady, seated, in no evident distress Head: head normocephalic and atraumatic.   Neck: supple with no carotid or supraclavicular bruits Cardiovascular: regular rate and rhythm, no murmurs Musculoskeletal: no deformity Skin:  no rash/petichiae Vascular:  Normal pulses all extremities  Neurologic Exam Mental Status: Awake and fully alert. Oriented to place and time. Recent and remote memory intact. Attention span, concentration and fund of knowledge appropriate. Mood and affect appropriate.  Diminished recall 0/3.  Able to name only 7 animals which can walk on 4 legs.  Mini-Mental status exam 20 but  limited due to patient's poor vision.  Diminished 3 word recall 0/3.  Able to name only 7 animals which can walk on 4 legs.  Unable to do clock drawing. Cranial Nerves: Fundoscopic exam difficult through undilated pupils.. Pupils equal, sluggishly reactive to light.  Diminished vision acuity bilaterally in finger counting limited to only 2 to 3 feet on both sides.  Extraocular movements full without nystagmus. Visual fields cannot be tested  due to poor vision. Hearing slightly diminished bilaterally facial sensation intact. Face, tongue, palate moves normally and symmetrically.  Motor: Normal bulk and tone. Normal strength in all tested extremity muscles. Sensory.: intact to touch , pinprick , position and vibratory sensation.  Coordination: Rapid alternating movements normal in all extremities. Finger-to-nose and heel-to-shin performed accurately bilaterally. Gait and Station: Arises from chair without difficulty. Stance is broad-based. Gait demonstrates mild ataxia.  Unsteady while standing on a narrow base and on either foot unsupported..  Not able to heel, toe and tandem walk .  Reflexes: 1+ and symmetric. Toes downgoing.     MMSE - Mini Mental State Exam 12/09/2020  Orientation to time 4  Orientation to Place 5  Registration 3  Attention/ Calculation 1  Recall 0  Language- name 2 objects 2  Language- repeat 0  Language- follow 3 step command 3  Language- read & follow direction 1  Write a sentence 1  Copy design 0  Copy design-comments unable to do/visual limitations  Total score 20     ASSESSMENT: 80 year old Caucasian lady with recent episode of confusion and disorientation in the setting of E. coli urinary tract infection who likely has baseline mild cognitive impairment. 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     PLAN: I had a long discussion with the patient and her husband regarding her recent episode of confusion and disorientation as well as short-term memory difficulties which likely represent mild cognitive impairment.  She does have history of Michelle [name] syndrome and visual hallucinations related to her severe bilateral macular degeneration and MRI scan shows a calcified pineal region mass likely a benign calcified pineal tumor/cyst which is unlikely to be of clinical significance and likely [name] no further follow-up.  I do not  believe doing an MRI with contrast is likely going to change treatment plan as the calcified midline pineal region tumor is likely a meningioma which is asymptomatic and hence we  will defer this for now.  Check dementia panel labs, EEG.  Trial of Aricept 5 mg daily for a month to increase if tolerated to 10 mg if no side effects.  Patient was also advised to increase participation in cognitively challenging activities like solving crossword puzzles, playing bridge and sodoku.  We also discussed memory compensation strategies.  She will return for follow-up in the  future in 3 months or call earlier if necessary.  Greater than 50% time during this 45-minute consultation visit were spent on counseling and coordination of care about her episode of confusion, short-term memory difficulties abnormal MRI scan showing calcified pineal lesion and answering questions. Antony Contras, MD Note: This document was prepared with digital dictation and possible smart phrase technology. Any transcriptional errors that result from this process are unintentional.

## 2020-12-10 NOTE — Telephone Encounter (Signed)
Called and spoke to [facility]  They will not need a PA for the Aricept and are filling the first 30 days at 5 mg for the starter pack, if the patient tolerates well, then a 10 mg prescription can be sent in.   Patient denied further questions, verbalized understanding and expressed appreciation for the phone call.

## 2020-12-11 ENCOUNTER — Encounter: Payer: Self-pay | Admitting: *Deleted

## 2020-12-11 ENCOUNTER — Other Ambulatory Visit: Payer: Self-pay | Admitting: Emergency Medicine

## 2020-12-11 ENCOUNTER — Ambulatory Visit (INDEPENDENT_AMBULATORY_CARE_PROVIDER_SITE_OTHER): Payer: Medicare HMO | Admitting: Neurology

## 2020-12-11 ENCOUNTER — Telehealth: Payer: Self-pay | Admitting: Neurology

## 2020-12-11 DIAGNOSIS — R413 Other amnesia: Secondary | ICD-10-CM

## 2020-12-11 DIAGNOSIS — R410 Disorientation, unspecified: Secondary | ICD-10-CM

## 2020-12-11 LAB — DEMENTIA PANEL
Homocysteine: 13 umol/L (ref 0.0–21.3)
RPR Ser Ql: NONREACTIVE
TSH: 2.52 u[IU]/mL (ref 0.450–4.500)
Vitamin B-12: 695 pg/mL (ref 232–1245)

## 2020-12-11 MED ORDER — DONEPEZIL HCL 5 MG PO TABS: 5.0000 mg | ORAL_TABLET | Freq: Every day | ORAL | 3 refills | Status: DC

## 2020-12-11 NOTE — Telephone Encounter (Signed)
Patient cannot afford Aricept. Would like a call back regarding prescribing something more affordable. Best call back [phone]. Also change of pharmacy.

## 2020-12-11 NOTE — Progress Notes (Signed)
Kindly inform the patient that lab work for reversible causes of memory loss was all satisfactory

## 2020-12-12 ENCOUNTER — Inpatient Hospital Stay: Payer: Medicare HMO | Admitting: Family Medicine

## 2020-12-13 ENCOUNTER — Other Ambulatory Visit: Payer: Self-pay

## 2020-12-13 ENCOUNTER — Encounter: Payer: Self-pay | Admitting: Family Medicine

## 2020-12-13 ENCOUNTER — Ambulatory Visit (INDEPENDENT_AMBULATORY_CARE_PROVIDER_SITE_OTHER): Payer: Medicare HMO | Admitting: Family Medicine

## 2020-12-13 VITALS — BP 200/110 | HR 63 | Temp 97.3°F | Ht 63.0 in | Wt 154.0 lb

## 2020-12-13 DIAGNOSIS — E876 Hypokalemia: Secondary | ICD-10-CM | POA: Diagnosis not present

## 2020-12-13 DIAGNOSIS — R4189 Other symptoms and signs involving cognitive functions and awareness: Secondary | ICD-10-CM | POA: Diagnosis not present

## 2020-12-13 DIAGNOSIS — I10 Essential (primary) hypertension: Secondary | ICD-10-CM

## 2020-12-13 DIAGNOSIS — G3184 Mild cognitive impairment, so stated: Secondary | ICD-10-CM | POA: Insufficient documentation

## 2020-12-13 DIAGNOSIS — D329 Benign neoplasm of meninges, unspecified: Secondary | ICD-10-CM

## 2020-12-13 DIAGNOSIS — Z09 Encounter for follow-up examination after completed treatment for conditions other than malignant neoplasm: Secondary | ICD-10-CM

## 2020-12-13 LAB — BASIC METABOLIC PANEL
BUN: 12 mg/dL (ref 6–23)
CO2: 24 mEq/L (ref 19–32)
Calcium: 10 mg/dL (ref 8.4–10.5)
Chloride: 108 mEq/L (ref 96–112)
Creatinine, Ser: 1.03 mg/dL (ref 0.40–1.20)
GFR: 50.83 mL/min — ABNORMAL LOW (ref >60.00)
Glucose, Bld: 95 mg/dL (ref 70–99)
Potassium: 3.4 mEq/L — ABNORMAL LOW (ref 3.5–5.1)
Sodium: 142 mEq/L (ref 135–145)

## 2020-12-13 MED ORDER — LISINOPRIL 20 MG PO TABS: ORAL_TABLET | ORAL | 3 refills | Status: DC

## 2020-12-13 MED ORDER — CLONIDINE HCL 0.1 MG PO TABS: 0.1000 mg | ORAL_TABLET | Freq: Two times a day (BID) | ORAL | 3 refills | Status: DC

## 2020-12-13 NOTE — Progress Notes (Signed)
Established Patient Office Visit  Subjective:  Patient ID: Michelle [name], female    DOB: June 19, 1941  Age: 80 y.o. MRN: [id number]  CC:  Chief Complaint  Patient presents with  . Hospitalization Follow-up    HPI Michelle [name] presents for hospital discharge follow-up status post urosepsis with E. coli.  Mild cognitive decline was noted.  MRI of brain did show small vessel disease and what is believed to be a benign meningioma posterior to the pineal gland.  Aricept at 5 mg was started that will be increased to 10 mg after 4 weeks.  Follow-up is planned with neurology in 3 months.  Furosemide was discontinued secondary to dehydration and hypokalemia.  She is here today feeling better from the sepsis without headache.  Furosemide has been discontinued pressure is high today.  She has been taking no supplemental potassium.  Past Medical History:  Diagnosis Date  . Allergic rhinitis   . Cystocele   . Fibromuscular dysplasia of renal artery ([place])   . Hypertension    fibromuscular dysplasia  . Osteoarthritis   . Osteoporosis   . PMS (premenstrual syndrome)   . Retinal hemorrhage, right    with macular degeneration    Past Surgical History:  Procedure Laterality Date  . ANGIOPLASTY     right renal 1981  . BTL    . cb x 3    . HEMORRHOID SURGERY    . IR PTA RENAL VISCERAL  05/11/2017  . IR RADIOLOGIST EVAL & MGMT  04/21/2017  . IR RENAL BILAT S&I MOD SED  05/11/2017  . IR US GUIDE VASC ACCESS RIGHT  05/11/2017  . Rt ovarian cyst removed    . TONSILLECTOMY AND ADENOIDECTOMY      History reviewed. No pertinent family history.  Social History   Socioeconomic History  . Marital status: Married    Spouse name: [name]  . Number of children: Not on file  . Years of education: Not on file  . Highest education level: Not on file  Occupational History  . Not on file  Tobacco Use  . Smoking status: Former Research scientist (life sciences)  . Smokeless tobacco: Former Systems developer    Quit date: 10/27/1983   Substance and Sexual Activity  . Alcohol use: No  . Drug use: No  . Sexual activity: Not on file  Other Topics Concern  . Not on file  Social History Narrative   Lives with husband at home   Right Handed   Drinks 4-6 cups caffeine daily   Social Determinants of Health   Financial Resource Strain: Low Risk   . Difficulty of Paying Living Expenses: Not hard at all  Food Insecurity: No Food Insecurity  . Worried About Charity fundraiser in the Last Year: Never true  . Ran Out of Food in the Last Year: Never true  Transportation Needs: No Transportation Needs  . Lack of Transportation (Medical): No  . Lack of Transportation (Non-Medical): No  Physical Activity: Sufficiently Active  . Days of Exercise per Week: 7 days  . Minutes of Exercise per Session: 30 min  Stress: No Stress Concern Present  . Feeling of Stress : Not at all  Social Connections: Moderately Isolated  . Frequency of Communication with Friends and Family: More than three times a week  . Frequency of Social Gatherings with Friends and Family: Once a week  . Attends Religious Services: Never  . Active Member of Clubs or Organizations: No  . Attends Club  or Organization Meetings: Never  . Marital Status: Married  Human resources officer Violence: Not At Risk  . Fear of Current or Ex-Partner: No  . Emotionally Abused: No  . Physically Abused: No  . Sexually Abused: No    Outpatient Medications Prior to Visit  Medication Sig Dispense Refill  . aspirin EC 81 MG tablet Take 81 mg by mouth daily.    . beta carotene w/minerals (OCUVITE) tablet Take 1 tablet by mouth daily.    . Bevacizumab (AVASTIN) 100 MG/4ML SOLN Inject 1-10 mg into the eye See admin instructions. For eyes One every 6 weeks 1/10 mg    . cetirizine (ZYRTEC) 10 MG tablet Take 10 mg by mouth daily as needed for allergies.    Marland Kitchen donepezil (ARICEPT) 5 MG tablet Take 1 tablet (5 mg total) by mouth at bedtime. Start 1 tablet daily x 4 weeks and then 2 tablets  daily 60 tablet 3  . omeprazole (PRILOSEC) 20 MG capsule Take 20 mg by mouth daily before breakfast.    . lisinopril (PRINIVIL,ZESTRIL) 20 MG tablet Take 20 mg by mouth daily.    . clonazePAM (KLONOPIN) 0.5 MG tablet Take 1 tablet (0.5 mg total) by mouth 2 (two) times daily as needed for anxiety. 1/2 to 1 tab po BID PRN (Patient not taking: Reported on 12/13/2020) 60 tablet 2   No facility-administered medications prior to visit.    Allergies  Allergen Reactions  . Iodinated Diagnostic Agents Hives    Patient believes this was an older form of iodinated contrast.  Tolerated OMNIPAQUE IOHEXOL 11/19/14 for renal arteriogram/angioplasty without prep. [name], RN  . [place]   . Diphenhydramine Hcl Hives    ROS Review of Systems  Constitutional: Negative.  Negative for chills, fatigue and fever.  HENT: Negative.   Eyes: Positive for visual disturbance.  Respiratory: Negative.  Negative for chest tightness and shortness of breath.   Cardiovascular: Negative for chest pain and palpitations.  Gastrointestinal: Negative.   Genitourinary: Negative.   Neurological: Negative for light-headedness and headaches.  Psychiatric/Behavioral: Positive for confusion.      Objective:    Physical Exam Constitutional:      General: She is not in acute distress.    Appearance: Normal appearance. She is not ill-appearing, toxic-appearing or diaphoretic.  HENT:     Head: Normocephalic and atraumatic.     Right Ear: Tympanic membrane, ear canal and external ear normal.     Left Ear: Tympanic membrane, ear canal and external ear normal.     Mouth/Throat:     Mouth: Mucous membranes are moist.     Pharynx: Oropharynx is clear. No oropharyngeal exudate or posterior oropharyngeal erythema.  Eyes:     General: No scleral icterus.       Right eye: No discharge.        Left eye: No discharge.     Conjunctiva/sclera: Conjunctivae normal.  Cardiovascular:     Rate and Rhythm: Normal rate and regular rhythm.   Pulmonary:     Effort: Pulmonary effort is normal.     Breath sounds: Normal breath sounds.  Skin:    General: Skin is warm and dry.  Neurological:     Mental Status: She is alert and oriented to person, place, and time.  Psychiatric:        Mood and Affect: Mood normal.        Behavior: Behavior normal.     BP (!) 200/110 (BP Location: Left Arm, Patient Position: Sitting, Cuff  Size: Normal)   Pulse 63   Temp (!) 97.3 F (36.3 C) (Temporal)   Ht 5\' 3"  (1.6 m)   Wt 154 lb (69.9 kg)   SpO2 97%   BMI 27.28 kg/m  Wt Readings from Last 3 Encounters:  12/13/20 154 lb (69.9 kg)  12/09/20 159 lb 6.4 oz (72.3 kg)  12/03/20 163 lb (73.9 kg)     Health Maintenance Due  Topic Date Due  . COVID-19 Vaccine (4 - Booster for Pfizer series) 08/07/2020    There are no preventive care reminders to display for this patient.  Lab Results  Component Value Date   TSH 2.520 12/09/2020   Lab Results  Component Value Date   WBC 4.0 12/03/2020   HGB 11.2 (L) 12/03/2020   HCT 34.4 (L) 12/03/2020   MCV 94.2 12/03/2020   PLT 144 (L) 12/03/2020   Lab Results  Component Value Date   NA 142 12/03/2020   K 3.0 (L) 12/03/2020   CO2 27 12/03/2020   GLUCOSE 102 (H) 12/03/2020   BUN 16 12/03/2020   CREATININE 1.10 (H) 12/03/2020   BILITOT 1.2 12/01/2020   ALKPHOS 86 12/01/2020   AST 32 12/01/2020   ALT 49 (H) 12/01/2020   PROT 6.3 (L) 12/01/2020   ALBUMIN 3.4 (L) 12/01/2020   CALCIUM 9.0 12/03/2020   ANIONGAP 4 (L) 12/03/2020   GFR 41.11 (L) 03/15/2020   Lab Results  Component Value Date   CHOL 174 03/15/2020   Lab Results  Component Value Date   HDL 51.30 03/15/2020   Lab Results  Component Value Date   LDLCALC 95 03/15/2020   Lab Results  Component Value Date   TRIG 141.0 03/15/2020   Lab Results  Component Value Date   CHOLHDL 3 03/15/2020   Lab Results  Component Value Date   HGBA1C 5.3 09/14/2006      Assessment & Plan:   Problem List Items Addressed  This Visit      Cardiovascular and Mediastinum   Primary hypertension - Primary   Relevant Medications   cloNIDine (CATAPRES) 0.1 MG tablet   lisinopril (ZESTRIL) 20 MG tablet   Other Relevant Orders   Basic metabolic panel     Nervous and Auditory   Meningioma (HCC)     Other   Hypokalemia   Relevant Orders   Basic metabolic panel   Cognitive decline    Other Visit Diagnoses    Hospital discharge follow-up          Meds ordered this encounter  Medications  . cloNIDine (CATAPRES) 0.1 MG tablet    Sig: Take 1 tablet (0.1 mg total) by mouth 2 (two) times daily.    Dispense:  60 tablet    Refill:  3  . lisinopril (ZESTRIL) 20 MG tablet    Sig: Take one tablet twice daily    Dispense:  60 tablet    Refill:  3    Follow-up: Return in about 1 week (around 12/20/2020), or eat one banana each day..  Will increase Zestril to 20 twice daily and add clonidine 0.1 twice daily.  She will eat 1 banana daily.  Follow-up in 1 week for recheck.  Rechecking potassium today.  Have not ruled out using furosemide in the future but will need supplemental potassium with this medication.  Follow-up with neurology is planned for 3 months.  See also neurologist note from hospitalization.   Libby Maw, MD

## 2020-12-13 NOTE — Progress Notes (Signed)
Kindly inform the patient that EEG study shows mild slowing of the brainwave activities throughout which is a nonspecific finding seen in a variety of conditions including those with memory loss.  No definite seizure activity was noted during the study

## 2020-12-17 ENCOUNTER — Telehealth: Payer: Self-pay | Admitting: Emergency Medicine

## 2020-12-17 NOTE — Telephone Encounter (Signed)
-----   Message from Garvin Fila, MD sent at 12/13/2020  9:01 AM EDT ----- Michelle [name] inform the patient that EEG study shows mild slowing of the brainwave activities throughout which is a nonspecific finding seen in a variety of conditions including those with memory loss.  No definite seizure activity was noted during the study

## 2020-12-19 ENCOUNTER — Telehealth: Payer: Self-pay | Admitting: Family Medicine

## 2020-12-19 NOTE — Telephone Encounter (Signed)
Michelle [name] w/[facility]) [phone]   Received call from [place] stating pt husband reached out to [facility] 2x now in regards to pts worsening dementia. [name] does want pt to stay on Aricept and Danae Chen noted it takes a month to start working. Requesting that PCP reach out to pt husband in regard to possible [facility] services or other alternatives for help.  Pt insurance does not cover [place] aides

## 2020-12-19 NOTE — Telephone Encounter (Signed)
Called and spoke to patient's husband to encourage keeping Michelle [name] on the Aricept, explained it can take between 1-2 months before he may see a real difference in her but to not give up and keep her on it.  Patient denied further questions, verbalized understanding and expressed appreciation for the phone call.

## 2020-12-19 NOTE — Telephone Encounter (Signed)
Called Dr, [place] office in regards to possible [facility] or help they can order and assist patient's husband with her.  Spoke to [place], Therapist, sports.   Asked about offering her home health, aids, programs or anything they can order to assist her husband and patient with diease process.  They are going to reach out to patient's husband and see what they can do to best assist him.

## 2020-12-20 NOTE — Telephone Encounter (Signed)
I'm not sure what the options are, other than private pay, if insurance will not cover a [place] aide. Husband could Theatre manager to see if they have any programs, respite care, etc. Other option would be moving pt to a memory care facility.

## 2020-12-24 NOTE — Telephone Encounter (Signed)
I called Michelle [name] and informed him of message from Dr. Loletha Grayer.

## 2020-12-27 ENCOUNTER — Ambulatory Visit (INDEPENDENT_AMBULATORY_CARE_PROVIDER_SITE_OTHER): Payer: Medicare HMO | Admitting: Family Medicine

## 2020-12-27 ENCOUNTER — Encounter: Payer: Self-pay | Admitting: Family Medicine

## 2020-12-27 ENCOUNTER — Other Ambulatory Visit: Payer: Self-pay

## 2020-12-27 VITALS — BP 130/70 | HR 62 | Temp 97.4°F | Ht 63.0 in | Wt 148.4 lb

## 2020-12-27 DIAGNOSIS — I10 Essential (primary) hypertension: Secondary | ICD-10-CM | POA: Diagnosis not present

## 2020-12-27 MED ORDER — LISINOPRIL 20 MG PO TABS: ORAL_TABLET | ORAL | 3 refills | Status: DC

## 2020-12-27 MED ORDER — CLONIDINE HCL 0.1 MG PO TABS
0.1000 mg | ORAL_TABLET | Freq: Two times a day (BID) | ORAL | 3 refills | Status: DC
Start: 2020-12-27 — End: 2021-03-10

## 2020-12-27 NOTE — Progress Notes (Signed)
Chief Complaint  Patient presents with  . Follow-up    F/u HTN.  BP averaging 128/60,  134/70    HPI: Michelle [name] is a 80 y.o. female here for HTN follow-up and is accompanied by her husband [name]. She was seen by my colleague Dr. Ethelene Hal on 12/13/20 for HTN and hospital f/u. At that time, pt was started on clonidine 0.1mg  BID and lisinopril was increased to 20mg  BID from daily. Today pts husband reports home BP readings 128-134/60-70.  No issues with lightheadedness, dizziness, vision changes.   BP Readings from Last 3 Encounters:  12/27/20 130/70  12/13/20 (!) 200/110  12/09/20 (!) 190/83   Lab Results  Component Value Date   CREATININE 1.03 12/13/2020   BUN 12 12/13/2020   NA 142 12/13/2020   K 3.4 (L) 12/13/2020   CL 108 12/13/2020   CO2 24 12/13/2020    Past Medical History:  Diagnosis Date  . Allergic rhinitis   . Cystocele   . Fibromuscular dysplasia of renal artery ([place])   . Hypertension    fibromuscular dysplasia  . Osteoarthritis   . Osteoporosis   . PMS (premenstrual syndrome)   . Retinal hemorrhage, right    with macular degeneration    Past Surgical History:  Procedure Laterality Date  . ANGIOPLASTY     right renal 1981  . BTL    . cb x 3    . HEMORRHOID SURGERY    . IR PTA RENAL VISCERAL  05/11/2017  . IR RADIOLOGIST EVAL & MGMT  04/21/2017  . IR RENAL BILAT S&I MOD SED  05/11/2017  . IR US GUIDE VASC ACCESS RIGHT  05/11/2017  . Rt ovarian cyst removed    . TONSILLECTOMY AND ADENOIDECTOMY      Social History   Socioeconomic History  . Marital status: Married    Spouse name: [name]  . Number of children: Not on file  . Years of education: Not on file  . Highest education level: Not on file  Occupational History  . Not on file  Tobacco Use  . Smoking status: Former Research scientist (life sciences)  . Smokeless tobacco: Former Systems developer    Quit date: 10/27/1983  Substance and Sexual Activity  . Alcohol use: No  . Drug use: No  . Sexual activity: Not on  file  Other Topics Concern  . Not on file  Social History Narrative   Lives with husband at home   Right Handed   Drinks 4-6 cups caffeine daily   Social Determinants of Health   Financial Resource Strain: Low Risk   . Difficulty of Paying Living Expenses: Not hard at all  Food Insecurity: No Food Insecurity  . Worried About Charity fundraiser in the Last Year: Never true  . Ran Out of Food in the Last Year: Never true  Transportation Needs: No Transportation Needs  . Lack of Transportation (Medical): No  . Lack of Transportation (Non-Medical): No  Physical Activity: Sufficiently Active  . Days of Exercise per Week: 7 days  . Minutes of Exercise per Session: 30 min  Stress: No Stress Concern Present  . Feeling of Stress : Not at all  Social Connections: Moderately Isolated  . Frequency of Communication with Friends and Family: More than three times a week  . Frequency of Social Gatherings with Friends and Family: Once a week  . Attends Religious Services: Never  . Active Member of Clubs or Organizations: No  . Attends Archivist  Meetings: Never  . Marital Status: Married  Human resources officer Violence: Not At Risk  . Fear of Current or Ex-Partner: No  . Emotionally Abused: No  . Physically Abused: No  . Sexually Abused: No    No family history on file.   Immunization History  Administered Date(s) Administered  . Fluad Quad(high Dose 65+) 04/05/2019  . Influenza Split 05/08/2013  . Influenza Whole 04/26/2009  . Influenza, High Dose Seasonal PF 05/21/2014  . Influenza-Unspecified 05/07/2020  . PFIZER(Purple Top)SARS-COV-2 Vaccination 08/17/2019, 09/07/2019, 05/07/2020  . Pneumococcal Conjugate-13 11/01/2013  . Pneumococcal Polysaccharide-23 07/28/2003, 10/23/2008  . Td 07/27/2002  . Tdap 01/23/2013  . Zoster, Live 01/23/2013    Outpatient Encounter Medications as of 12/27/2020  Medication Sig Note  . aspirin EC 81 MG tablet Take 81 mg by mouth daily.   .  beta carotene w/minerals (OCUVITE) tablet Take 1 tablet by mouth daily.   . Bevacizumab (AVASTIN) 100 MG/4ML SOLN Inject 1-10 mg into the eye See admin instructions. For eyes One every 6 weeks 1/10 mg 05/04/2017: Next dose:06/27/17  . cetirizine (ZYRTEC) 10 MG tablet Take 10 mg by mouth daily as needed for allergies.   . cloNIDine (CATAPRES) 0.1 MG tablet Take 1 tablet (0.1 mg total) by mouth 2 (two) times daily.   Marland Kitchen donepezil (ARICEPT) 5 MG tablet Take 1 tablet (5 mg total) by mouth at bedtime. Start 1 tablet daily x 4 weeks and then 2 tablets daily   . lisinopril (ZESTRIL) 20 MG tablet Take one tablet twice daily   . omeprazole (PRILOSEC) 20 MG capsule Take 20 mg by mouth daily before breakfast.    No facility-administered encounter medications on file as of 12/27/2020.     ROS: Pertinent positives and negatives noted in HPI. Remainder of ROS non-contributory    Allergies  Allergen Reactions  . Iodinated Diagnostic Agents Hives    Patient believes this was an older form of iodinated contrast.  Tolerated OMNIPAQUE IOHEXOL 11/19/14 for renal arteriogram/angioplasty without prep. [name], RN  . [place]   . Diphenhydramine Hcl Hives    BP 130/70   Pulse 62   Temp (!) 97.4 F (36.3 C) (Temporal)   Ht 5\' 3"  (1.6 m)   Wt 148 lb 6.4 oz (67.3 kg)   SpO2 97%   BMI 26.29 kg/m   Wt Readings from Last 3 Encounters:  12/27/20 148 lb 6.4 oz (67.3 kg)  12/13/20 154 lb (69.9 kg)  12/09/20 159 lb 6.4 oz (72.3 kg)   Temp Readings from Last 3 Encounters:  12/27/20 (!) 97.4 F (36.3 C) (Temporal)  12/13/20 (!) 97.3 F (36.3 C) (Temporal)  12/03/20 99.2 F (37.3 C) (Oral)   BP Readings from Last 3 Encounters:  12/27/20 130/70  12/13/20 (!) 200/110  12/09/20 (!) 190/83   Pulse Readings from Last 3 Encounters:  12/27/20 62  12/13/20 63  12/09/20 76     Physical Exam Constitutional:      General: She is not in acute distress. Cardiovascular:     Rate and Rhythm: Normal rate and  regular rhythm.     Pulses: Normal pulses.  Pulmonary:     Effort: Pulmonary effort is normal. No respiratory distress.     Breath sounds: Normal breath sounds.  Musculoskeletal:     Right lower leg: No edema.     Left lower leg: No edema.  Neurological:     Mental Status: She is alert. Mental status is at baseline.     A/P:  1. Primary hypertension - much improved and at goal - cont both meds and will send refill w/ 90 day supply to [place] pharm as requested by pt and husband Refill: - cloNIDine (CATAPRES) 0.1 MG tablet; Take 1 tablet (0.1 mg total) by mouth 2 (two) times daily.  Dispense: 180 tablet; Refill: 3 - lisinopril (ZESTRIL) 20 MG tablet; Take one tablet twice daily  Dispense: 180 tablet; Refill: 3 - f/u in 6 mo or sooner PRN   This visit occurred during the SARS-CoV-2 public health emergency.  Safety protocols were in place, including screening questions prior to the visit, additional usage of staff PPE, and extensive cleaning of exam room while observing appropriate contact time as indicated for disinfecting solutions.

## 2021-01-22 ENCOUNTER — Telehealth: Payer: Self-pay | Admitting: Neurology

## 2021-01-22 NOTE — Telephone Encounter (Signed)
Pt's husband on DPR called requesting refill for wife's donepezil (ARICEPT) 5 MG tablet. From now on they would like the prescriptions to be called into [facility] mail order pharmacy, phone number is [phone].

## 2021-01-23 ENCOUNTER — Other Ambulatory Visit: Payer: Self-pay | Admitting: Emergency Medicine

## 2021-01-23 MED ORDER — DONEPEZIL HCL 5 MG PO TABS: 5.0000 mg | ORAL_TABLET | Freq: Every day | ORAL | 3 refills | Status: DC

## 2021-01-24 ENCOUNTER — Other Ambulatory Visit: Payer: Self-pay | Admitting: Neurology

## 2021-01-29 ENCOUNTER — Encounter: Payer: Self-pay | Admitting: Family Medicine

## 2021-01-30 ENCOUNTER — Other Ambulatory Visit: Payer: Self-pay | Admitting: *Deleted

## 2021-01-30 ENCOUNTER — Encounter: Payer: Self-pay | Admitting: Family Medicine

## 2021-01-30 ENCOUNTER — Telehealth: Payer: Self-pay | Admitting: *Deleted

## 2021-01-30 MED ORDER — DONEPEZIL HCL 5 MG PO TABS: 5.0000 mg | ORAL_TABLET | Freq: Every day | ORAL | 3 refills | Status: DC

## 2021-01-30 NOTE — Telephone Encounter (Signed)
error 

## 2021-02-24 ENCOUNTER — Telehealth: Payer: Self-pay | Admitting: *Deleted

## 2021-02-24 NOTE — Telephone Encounter (Signed)
Pt [facility] form in nurse Pod.

## 2021-03-03 ENCOUNTER — Telehealth: Payer: Self-pay | Admitting: Neurology

## 2021-03-03 NOTE — Telephone Encounter (Signed)
Pt's husband [name] called wanting to know if the forms he dropped off for [facility] have been completed. Pt's husband requesting a call back.

## 2021-03-04 ENCOUNTER — Telehealth: Payer: Self-pay | Admitting: Family Medicine

## 2021-03-04 NOTE — Telephone Encounter (Signed)
Pt's husband has brought paperwork in to be filled out [facility] [place] Exam Report. When ever paperwork is complete please fax to [phone]. I have placed paperwork in [name]'s folder up front.

## 2021-03-10 ENCOUNTER — Other Ambulatory Visit: Payer: Self-pay | Admitting: Family Medicine

## 2021-03-10 DIAGNOSIS — I10 Essential (primary) hypertension: Secondary | ICD-10-CM

## 2021-03-10 NOTE — Telephone Encounter (Signed)
Pts husband called to get update on this, please advise. He requested a call back

## 2021-03-11 ENCOUNTER — Ambulatory Visit (INDEPENDENT_AMBULATORY_CARE_PROVIDER_SITE_OTHER): Payer: Medicare HMO | Admitting: Family Medicine

## 2021-03-11 ENCOUNTER — Other Ambulatory Visit: Payer: Self-pay

## 2021-03-11 VITALS — BP 128/68 | HR 58 | Temp 96.8°F | Ht 63.0 in | Wt 143.8 lb

## 2021-03-11 DIAGNOSIS — H5316 Psychophysical visual disturbances: Secondary | ICD-10-CM

## 2021-03-11 DIAGNOSIS — G3184 Mild cognitive impairment, so stated: Secondary | ICD-10-CM | POA: Diagnosis not present

## 2021-03-11 DIAGNOSIS — K219 Gastro-esophageal reflux disease without esophagitis: Secondary | ICD-10-CM | POA: Insufficient documentation

## 2021-03-11 DIAGNOSIS — N1831 Chronic kidney disease, stage 3a: Secondary | ICD-10-CM | POA: Insufficient documentation

## 2021-03-11 DIAGNOSIS — N183 Chronic kidney disease, stage 3 unspecified: Secondary | ICD-10-CM | POA: Insufficient documentation

## 2021-03-11 NOTE — Progress Notes (Signed)
[facility] [address] [place] [address] Dept: [phone] Dept Fax: [phone]  Office Visit  Subjective:    Patient ID: Michelle [name], female    DOB: 03-28-1941, 80 y.o..   MRN: [id number]  Chief Complaint  Patient presents with   Follow-up    Paperwork need filling out.     History of Present Illness:  Patient is in today for completion of paperwork related to her seeking services through [facility].  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 In May, Michelle [name] was evaluated by Dr. Leonie [name] (neurology) and noted ot have mild cognitive impairment. He started her on Aricept. She feels that this has actually helped to calm anxiety feelings she had been having.   Past Medical History: Patient Active Problem List   Diagnosis Date Noted   Stage 3 chronic kidney disease ([place]) 03/11/2021   Gastroesophageal reflux disease 03/11/2021   Dementia ([place]) 12/13/2020   Meningioma ([place]) 12/13/2020   Bacteremia 12/01/2020   Hypokalemia 12/01/2020   [name] 09/17/2019   Hyperplasia, renal artery fibromuscular ([place]) 08/11/2018   Macular degeneration of both eyes 08/11/2018   Essential hypertension, malignant 07/03/2014   History of renal artery stenosis 06/14/2014   Squamous cell carcinoma in situ of skin 02/08/2013   Dysplastic nevus of trunk 01/23/2013   Contact dermatitis and eczema 10/31/2012   Macular degeneration (senile) of retina, OD 10/23/2008   Osteoporosis 09/29/2007   Allergic rhinitis 02/24/2007   Generalized OA 02/24/2007   Past Surgical History:  Procedure Laterality Date   ANGIOPLASTY     right renal 1981   BTL     cb x 3     HEMORRHOID SURGERY     IR PTA RENAL VISCERAL   05/11/2017   IR RADIOLOGIST EVAL & MGMT  04/21/2017   IR RENAL BILAT S&I MOD SED  05/11/2017   IR US GUIDE VASC ACCESS RIGHT  05/11/2017   Rt ovarian cyst removed     TONSILLECTOMY AND ADENOIDECTOMY     No family history on file.  Outpatient Medications Prior to Visit  Medication Sig Dispense Refill   aspirin EC 81 MG tablet Take 81 mg by mouth daily.     beta carotene w/minerals (OCUVITE) tablet Take 1 tablet by mouth daily.     Bevacizumab (AVASTIN) 100 MG/4ML SOLN Inject 1-10 mg into the eye See admin instructions. For eyes One every 6 weeks 1/10 mg     cetirizine (ZYRTEC) 10 MG tablet Take 10 mg by mouth daily as needed for allergies.     cloNIDine (CATAPRES) 0.1 MG tablet TAKE 1 TABLET BY MOUTH 2 TIMES DAILY. 180 tablet 1   donepezil (ARICEPT) 5 MG tablet Take 1 tablet (5 mg total) by mouth at bedtime. 90 tablet 3   lisinopril (ZESTRIL) 20 MG tablet Take one tablet twice daily 180 tablet 3   omeprazole (PRILOSEC) 20 MG capsule Take 20 mg by mouth daily before breakfast.     No facility-administered medications prior to visit.   Allergies  Allergen Reactions   Iodinated Diagnostic Agents Hives    Patient believes this was an older form of iodinated contrast.  Tolerated OMNIPAQUE IOHEXOL 11/19/14 for renal arteriogram/angioplasty without prep. Michelle Lore, RN   [place]    Diphenhydramine Hcl Hives    Objective:  Today's Vitals   03/11/21 1127  BP: 128/68  Pulse: (!) 58  Temp: (!) 96.8 F (36 C)  TempSrc: Temporal  SpO2: 97%  Weight: 143 lb 12.8 oz (65.2 kg)  Height: '5\' 3"'$  (1.6 m)   Body mass index is 25.47 kg/m.   General: Well developed, well nourished. No acute distress. Psych: Alert and oriented x3. Normal mood and affect.  Health Maintenance Due  Topic Date Due   COVID-19 Vaccine (4 - Booster for Pfizer series) 08/07/2020   INFLUENZA VACCINE  02/24/2021     Assessment & Plan:   1. [name] 2. Mild cognitive impairment with memory loss Completed  "[facility] Medical Examination Report". We did discuss Michelle [name] moving her Aricept dose to the morning from later in the evening. She has a pending appointment to establish care.   Haydee Salter, MD

## 2021-03-17 ENCOUNTER — Ambulatory Visit: Payer: Medicare HMO | Admitting: Neurology

## 2021-04-11 ENCOUNTER — Other Ambulatory Visit: Payer: Self-pay | Admitting: Family Medicine

## 2021-04-11 DIAGNOSIS — I10 Essential (primary) hypertension: Secondary | ICD-10-CM

## 2021-05-09 ENCOUNTER — Other Ambulatory Visit: Payer: Self-pay

## 2021-05-12 ENCOUNTER — Other Ambulatory Visit: Payer: Self-pay

## 2021-05-12 ENCOUNTER — Encounter: Payer: Self-pay | Admitting: Family Medicine

## 2021-05-12 ENCOUNTER — Ambulatory Visit (INDEPENDENT_AMBULATORY_CARE_PROVIDER_SITE_OTHER): Payer: Medicare HMO | Admitting: Family Medicine

## 2021-05-12 VITALS — BP 124/68 | HR 56 | Temp 97.8°F | Ht 63.0 in | Wt 145.4 lb

## 2021-05-12 DIAGNOSIS — I10 Essential (primary) hypertension: Secondary | ICD-10-CM | POA: Diagnosis not present

## 2021-05-12 DIAGNOSIS — H3530 Unspecified macular degeneration: Secondary | ICD-10-CM

## 2021-05-12 DIAGNOSIS — K219 Gastro-esophageal reflux disease without esophagitis: Secondary | ICD-10-CM | POA: Diagnosis not present

## 2021-05-12 DIAGNOSIS — N1831 Chronic kidney disease, stage 3a: Secondary | ICD-10-CM

## 2021-05-12 DIAGNOSIS — D649 Anemia, unspecified: Secondary | ICD-10-CM | POA: Insufficient documentation

## 2021-05-12 DIAGNOSIS — G3184 Mild cognitive impairment, so stated: Secondary | ICD-10-CM | POA: Diagnosis not present

## 2021-05-12 DIAGNOSIS — M85839 Other specified disorders of bone density and structure, unspecified forearm: Secondary | ICD-10-CM

## 2021-05-12 MED ORDER — CALCIUM CARBONATE-VITAMIN D 500-200 MG-UNIT PO TABS: 1.0000 | ORAL_TABLET | Freq: Every day | ORAL | 3 refills | Status: DC

## 2021-05-12 NOTE — Patient Instructions (Signed)
Clonidine is not a diuretic. Home blood pressure cuff is reading accurately Scheduled follow-up with [name] (neurology)

## 2021-05-12 NOTE — Progress Notes (Signed)
[facility] [address] [place] [address] Dept: [phone] Dept Fax: [phone]  Transfer of Care Office Visit  Subjective:    Patient ID: Michelle [name], female    DOB: 06-29-1941, 80 y.o..   MRN: [id number]  Chief Complaint  Patient presents with   Establish Care    [facility]- establish care.   ? Rather she should get the new covid shots.     History of Present Illness:  Patient is in today to establish care. Michelle [name] is originally from [place]. She lived for 2-3 years in [place] when her husband was int he Therapist, art. Otherwise, she has lived here locally. She has been married Michelle [name]) for 3 yrs. She had 5 children, but only 3 that are living (lost an infant at 8 hrs. old and a son in a MVA in his early 26s). 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 She quit tobacco use 25-30 years ago. She denies alcohol or drug use.  Michelle [name] has a history of hypertension. She notes it had been difficult to find a medication that she would respond to. She is currently managed on lisinopril and clonidine. She also has a history of Stage 3 a chronic kidney disease and renal artery stenosis/fibromuscular hyperplasia. She follows with Michelle [name].  Michelle [name] has a history of allergic rhinitis, managed with Zyrtec.  Michelle [name] has a history of GERD, managed with Prilosec.  Michelle [name] has a history of macular degeneration with visual loss. She has received bevacizumab (Avastin) injections for this. 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 She notes she can discern actual images from the hallucinations, as the actual images are blurry. She cannot drive. She notes she can see letters, but that she cannot determine what they mean. She is able to navigate in her home well, but admits to trouble outside of this setting. He husbnad notes that she has some anxiety at times associated with the  CBS.  Michelle [name] has a history of mild cognitive impairment. She was evaluated earlier this year by Michelle [name]. She is currently on Aricept. 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 Michelle [name] did not recommend additional scans at this point.  Past Medical History: Patient Active Problem List   Diagnosis Date Noted   Normocytic anemia 05/12/2021   Stage 3 chronic kidney disease ([place]) 03/11/2021   Gastroesophageal reflux disease 03/11/2021   Mild cognitive impairment with memory loss 12/13/2020   Meningioma ([place]) 12/13/2020   Michelle [name] syndrome 09/17/2019   Hyperplasia, renal artery fibromuscular ([place]) 08/11/2018   Macular degeneration of both eyes 08/11/2018   Essential hypertension 07/03/2014   History of renal artery stenosis 06/14/2014   Squamous cell carcinoma in situ of skin 02/08/2013   Dysplastic nevus of trunk 01/23/2013   Contact dermatitis and eczema 10/31/2012   Osteopenia 09/29/2007   Allergic rhinitis 02/24/2007   Generalized OA 02/24/2007   Past Surgical History:  Procedure Laterality Date   ANGIOPLASTY     right renal 1981   BTL     cb x 3     HEMORRHOID SURGERY     IR PTA RENAL VISCERAL  05/11/2017   IR RADIOLOGIST EVAL & MGMT  04/21/2017   IR RENAL BILAT S&I MOD SED  05/11/2017   IR US GUIDE  VASC ACCESS RIGHT  05/11/2017   Rt ovarian cyst removed     TONSILLECTOMY AND ADENOIDECTOMY     Family History  Problem Relation Age of Onset   Diabetes Mother    Hypertension Mother    Stroke Brother    Diabetes Maternal Aunt    Outpatient Medications Prior to Visit  Medication Sig Dispense Refill   aspirin EC 81 MG tablet Take 81 mg by mouth daily.     beta carotene w/minerals (OCUVITE) tablet Take 1 tablet by mouth daily.     Bevacizumab (AVASTIN) 100 MG/4ML SOLN Inject 1-10 mg into the eye See admin instructions. For eyes One every 6 weeks 1/10 mg      cetirizine (ZYRTEC) 10 MG tablet Take 10 mg by mouth daily as needed for allergies.     cloNIDine (CATAPRES) 0.1 MG tablet TAKE 1 TABLET BY MOUTH 2 TIMES DAILY. 180 tablet 1   donepezil (ARICEPT) 5 MG tablet Take 1 tablet (5 mg total) by mouth at bedtime. 90 tablet 3   ibuprofen (ADVIL) 200 MG tablet Take 200 mg by mouth every 6 (six) hours as needed.     lisinopril (ZESTRIL) 20 MG tablet TAKE 1 TABLET BY MOUTH TWICE A DAY 180 tablet 0   omeprazole (PRILOSEC) 20 MG capsule Take 20 mg by mouth daily before breakfast.     No facility-administered medications prior to visit.   Allergies  Allergen Reactions   Iodinated Diagnostic Agents Hives    Patient believes this was an older form of iodinated contrast.  Tolerated OMNIPAQUE IOHEXOL 11/19/14 for renal arteriogram/angioplasty without prep. Michelle Lore, Michelle [name]   [place]    Diphenhydramine Hcl Hives   Objective:   Today's Vitals   05/12/21 1007  BP: 124/68  Pulse: (!) 56  Temp: 97.8 F (36.6 C)  TempSrc: Temporal  SpO2: 98%  Weight: 145 lb 6.4 oz (66 kg)  Height: 5\' 3"  (1.6 m)   Body mass index is 25.76 kg/m.   General: Well developed, well nourished. No acute distress. Psych: Alert and oriented. Normal mood and affect.  Health Maintenance Due  Topic Date Due   COVID-19 Vaccine (4 - Booster for Pfizer series) 07/30/2020   Zoster Vaccines- Shingrix (2 of 2) 03/24/2021   Lab Results BMP Latest Ref Rng & Units 12/13/2020 12/03/2020 12/02/2020  Glucose 70 - 99 mg/dL 95 102(H) 112(H)  BUN 6 - 23 mg/dL 12 16 19   Creatinine 0.40 - 1.20 mg/dL 1.03 1.10(H) 1.19(H)  Sodium 135 - 145 mEq/L 142 142 140  Potassium 3.5 - 5.1 mEq/L 3.4(L) 3.0(L) 3.4(L)  Chloride 96 - 112 mEq/L 108 111 110  CO2 19 - 32 mEq/L 24 27 29   Calcium 8.4 - 10.5 mg/dL 10.0 9.0 8.5(L)   CBC Latest Ref Rng & Units 12/03/2020 12/02/2020 12/01/2020  WBC 4.0 - 10.5 K/uL 4.0 4.9 6.8  Hemoglobin 12.0 - 15.0 g/dL 11.2(L) 10.6(L) 12.2  Hematocrit 36.0 - 46.0 % 34.4(L) 31.3(L) 35.2(L)   Platelets 150 - 400 K/uL 144(L) 138(L) 142(L)     Imaging: DXA scan (09/05/2020) ASSESSMENT: The BMD measured at Forearm Radius 33% is 0.689 g/cm2 with a T-score of -2.2. This patient is considered osteopenic according to [facility]) criteria.   The scan quality is good. Lumbar spine was not utilized due to advanced degenerative changes.   Site Region Measured Date Measured Age YA BMD Significant CHANGE T-score Left Forearm Radius 33% 09/05/2020 81.6 -2.2 0.689 g/cm2   DualFemur Neck Left 09/05/2020 81.6 -  1.9 0.772 g/cm2   DualFemur Total Mean 09/05/2020 81.6 -1.5 0.821 g/cm2 Assessment & Plan:   1. Essential hypertension Blood pressure is at goal. Continue clonidine and lisinopril.  2. Stage 3a chronic kidney disease (HCC) Stable. Blood pressure at goal. Continue to follow with nephrology.  3. Gastroesophageal reflux disease without esophagitis Stable on Prilosec.  4. Mild cognitive impairment with memory loss Due for follow-up with Michelle [name]. Ms. Blew notes she will call on her own for an appointment. Continue Aricept pending that follow-up.  5. Osteopenia of forearm, unspecified laterality DXA scan shows osteopenia. I recommend MS. Schul be taking a calcium with Vit D supplement daily.  - calcium-vitamin D (OSCAL WITH D) 500-200 MG-UNIT tablet; Take 1 tablet by mouth daily with breakfast.  Dispense: 90 tablet; Refill: 3  6. Macular degeneration of both eyes, unspecified type Followed by Dr. Zigmund Daniel.   Haydee Salter, MD

## 2021-05-13 ENCOUNTER — Encounter: Payer: Self-pay | Admitting: Family Medicine

## 2021-05-27 ENCOUNTER — Ambulatory Visit (INDEPENDENT_AMBULATORY_CARE_PROVIDER_SITE_OTHER): Payer: Medicare HMO | Admitting: *Deleted

## 2021-05-27 DIAGNOSIS — Z0000 Encounter for general adult medical examination without abnormal findings: Secondary | ICD-10-CM | POA: Diagnosis not present

## 2021-05-27 NOTE — Progress Notes (Signed)
Subjective:   Michelle [name] is a 80 y.o. female who presents for Medicare Annual (Subsequent) preventive examination.  I connected with  Michelle [name] on 05/27/21 by a telephone enabled telemedicine application and verified that I am speaking with the correct person using two identifiers.   I discussed the limitations of evaluation and management by telemedicine. The patient expressed understanding and agreed to proceed.    Review of Systems     Cardiac Risk Factors include: advanced age (>5men, >70 women);hypertension     Objective:    Today's Vitals   There is no height or weight on file to calculate BMI.  Advanced Directives 05/27/2021 12/01/2020 11/30/2020 05/21/2020 05/11/2017 11/19/2014 10/15/2014  Does Patient Have a Medical Advance Directive? No No No Yes Yes Yes Yes  Type of Advance Directive - - Public librarian;Living will [place];Living will [place];Living will [place];Living will  Does patient want to make changes to medical advance directive? - - - - - No - Patient declined No - Patient declined  Copy of [place] in Chart? - - - No - copy requested No - copy requested No - copy requested No - copy requested  Would patient like information on creating a medical advance directive? No - Patient declined No - Patient declined No - Patient declined - - - -    Current Medications (verified) Outpatient Encounter Medications as of 05/27/2021  Medication Sig   aspirin EC 81 MG tablet Take 81 mg by mouth daily.   beta carotene w/minerals (OCUVITE) tablet Take 1 tablet by mouth daily.   Bevacizumab (AVASTIN) 100 MG/4ML SOLN Inject 1-10 mg into the eye See admin instructions. For eyes One every 6 weeks 1/10 mg   calcium-vitamin D (OSCAL WITH D) 500-200 MG-UNIT tablet Take 1 tablet by mouth daily with breakfast.   cetirizine (ZYRTEC) 10 MG tablet Take 10 mg by mouth daily as  needed for allergies.   cloNIDine (CATAPRES) 0.1 MG tablet TAKE 1 TABLET BY MOUTH 2 TIMES DAILY.   donepezil (ARICEPT) 5 MG tablet Take 1 tablet (5 mg total) by mouth at bedtime.   ibuprofen (ADVIL) 200 MG tablet Take 200 mg by mouth every 6 (six) hours as needed.   lisinopril (ZESTRIL) 20 MG tablet TAKE 1 TABLET BY MOUTH TWICE A DAY   omeprazole (PRILOSEC) 20 MG capsule Take 20 mg by mouth daily before breakfast.   No facility-administered encounter medications on file as of 05/27/2021.    Allergies (verified) Iodinated diagnostic agents, [place], and Diphenhydramine hcl   History: Past Medical History:  Diagnosis Date   Allergic rhinitis    Cystocele    Fibromuscular dysplasia of renal artery (HCC)    Hypertension    fibromuscular dysplasia   Osteoarthritis    Osteoporosis    PMS (premenstrual syndrome)    Retinal hemorrhage, right    with macular degeneration   Past Surgical History:  Procedure Laterality Date   ANGIOPLASTY     right renal 1981   BTL     cb x 3     HEMORRHOID SURGERY     IR PTA RENAL VISCERAL  05/11/2017   IR RADIOLOGIST EVAL & MGMT  04/21/2017   IR RENAL BILAT S&I MOD SED  05/11/2017   IR US GUIDE VASC ACCESS RIGHT  05/11/2017   Rt ovarian cyst removed     TONSILLECTOMY AND ADENOIDECTOMY  Family History  Problem Relation Age of Onset   Diabetes Mother    Hypertension Mother    Stroke Brother    Diabetes Maternal Aunt    Social History   Socioeconomic History   Marital status: Married    Spouse name: [name]   Number of children: 5   Years of education: Not on file   Highest education level: Not on file  Occupational History   Not on file  Tobacco Use   Smoking status: Former   Smokeless tobacco: Former    Quit date: 10/27/1983  Vaping Use   Vaping Use: Never used  Substance and Sexual Activity   Alcohol use: No   Drug use: No   Sexual activity: Yes  Other Topics Concern   Not on file  Social History Narrative   Lives with  husband at home   Right Handed   Drinks 4-6 cups caffeine daily      Three living children (lost one child in infancy and another in a MVA in early 41s)   Social Determinants of Health   Financial Resource Strain: Low Risk    Difficulty of Paying Living Expenses: Not hard at all  Food Insecurity: No Food Insecurity   Worried About Charity fundraiser in the Last Year: Never true   Arboriculturist in the Last Year: Never true  Transportation Needs: No Transportation Needs   Lack of Transportation (Medical): No   Lack of Transportation (Non-Medical): No  Physical Activity: Insufficiently Active   Days of Exercise per Week: 3 days   Minutes of Exercise per Session: 30 min  Stress: No Stress Concern Present   Feeling of Stress : Not at all  Social Connections: Moderately Isolated   Frequency of Communication with Friends and Family: More than three times a week   Frequency of Social Gatherings with Friends and Family: Three times a week   Attends Religious Services: Never   Active Member of Clubs or Organizations: No   Attends Music therapist: Never   Marital Status: Married    Tobacco Counseling Counseling given: Not Answered   Clinical Intake:  Pre-visit preparation completed: Yes  Pain : No/denies pain     Nutritional Risks: None Diabetes: No  How often do you need to have someone help you when you read instructions, pamphlets, or other written materials from your doctor or pharmacy?: 1 - Never  Diabetic?no  Interpreter Needed?: No  Information entered by :: Leroy Kennedy LPN   Activities of Daily Living In your present state of health, do you have any difficulty performing the following activities: 05/27/2021 12/01/2020  Hearing? N N  Vision? Y Y  Difficulty concentrating or making decisions? N N  Walking or climbing stairs? N N  Dressing or bathing? N N  Doing errands, shopping? N Y  Conservation officer, nature and eating ? N -  Using the Toilet? N -  In  the past six months, have you accidently leaked urine? N -  Do you have problems with loss of bowel control? N -  Managing your Medications? N -  Comment husband helps with medication visually impaired -  Managing your Finances? N -  Housekeeping or managing your Housekeeping? N -  Some recent data might be hidden    Patient Care Team: Haydee Salter, MD as PCP - General (Family Medicine) Donato Heinz, MD as Consulting Physician (Nephrology) Hayden Pedro, MD as Consulting Physician (Ophthalmology) Garvin Fila, MD as  Consulting Physician (Neurology)  Indicate any recent Medical Services you may have received from other than [facility] providers in the past year (date may be approximate).     Assessment:   This is a routine wellness examination for Taylah.  Hearing/Vision screen Hearing Screening - Comments:: No trouble hearing Vision Screening - Comments:: Up to date Dr. Zigmund Daniel Visual impaired  Dietary issues and exercise activities discussed: Current Exercise Habits: Home exercise routine, Type of exercise: walking, Time (Minutes): 40, Frequency (Times/Week): 4, Weekly Exercise (Minutes/Week): 160, Intensity: Mild   Goals Addressed             This Visit's Progress    Patient Stated   On track    Increase activity     Patient Stated       Continue current lifestyle       Depression Screen PHQ 2/9 Scores 05/27/2021 03/11/2021 05/21/2020 03/15/2020 08/11/2018 11/01/2013  PHQ - 2 Score 0 4 0 0 0 0  PHQ- 9 Score - 14 - - - -    Fall Risk Fall Risk  05/27/2021 05/21/2020 03/15/2020 08/11/2018 11/01/2013  Falls in the past year? 0 0 0 0 No  Number falls in past yr: 0 0 0 - -  Injury with Fall? 0 0 0 - -  Follow up Falls evaluation completed;Falls prevention discussed Falls prevention discussed - - -    FALL RISK PREVENTION PERTAINING TO THE HOME:  Any stairs in or around the home? Yes  If so, are there any without handrails? No  Home free of loose throw rugs  in walkways, pet beds, electrical cords, etc? Yes  Adequate lighting in your home to reduce risk of falls? Yes   ASSISTIVE DEVICES UTILIZED TO PREVENT FALLS:  Life alert? No  Use of a cane, walker or w/c? Yes  Grab bars in the bathroom? Yes  Shower chair or bench in shower? No  Elevated toilet seat or a handicapped toilet? Yes   TIMED UP AND GO:  Was the test performed? No .    Cognitive Function:  Normal cognitive status assessed by direct observation by this Nurse Health Advisor. No abnormalities found.   MMSE - Mini Mental State Exam 12/09/2020  Orientation to time 4  Orientation to Place 5  Registration 3  Attention/ Calculation 1  Recall 0  Language- name 2 objects 2  Language- repeat 0  Language- follow 3 step command 3  Language- read & follow direction 1  Write a sentence 1  Copy design 0  Copy design-comments unable to do/visual limitations  Total score 20     6CIT Screen 05/21/2020  What Year? 4 points  What month? 0 points  What time? 0 points  Count back from 20 0 points  Months in reverse 0 points  Repeat phrase 0 points  Total Score 4    Immunizations Immunization History  Administered Date(s) Administered   DTaP 05/09/2014   Fluad Quad(high Dose 65+) 04/05/2019   Influenza Split 05/08/2013   Influenza Whole 04/26/2009   Influenza, High Dose Seasonal PF 05/21/2014, 03/24/2016, 03/26/2021   Influenza, Quadrivalent, Recombinant, Inj, Pf 05/04/2017, 05/10/2018   Influenza-Unspecified 05/07/2020   PFIZER(Purple Top)SARS-COV-2 Vaccination 08/17/2019, 09/07/2019, 05/07/2020   Pneumococcal Conjugate-13 11/01/2013, 05/10/2018   Pneumococcal Polysaccharide-23 07/28/2003, 10/23/2008, 08/08/2013   Td 07/27/2002   Tdap 01/23/2013   Zoster Recombinat (Shingrix) 01/27/2021   Zoster, Live 01/23/2013, 05/08/2014    TDAP status: Up to date  Flu Vaccine status: Up to date  Pneumococcal vaccine  status: Up to date  Covid-19 vaccine status: Information  provided on how to obtain vaccines.   Qualifies for Shingles Vaccine? No   Zostavax completed Yes   Shingrix Completed?: Yes  Screening Tests Health Maintenance  Topic Date Due   COVID-19 Vaccine (4 - Booster for Pfizer series) 07/02/2020   Zoster Vaccines- Shingrix (2 of 2) 03/24/2021   TETANUS/TDAP  01/24/2023   Pneumonia Vaccine 63+ Years old  Completed   INFLUENZA VACCINE  Completed   DEXA SCAN  Completed   HPV VACCINES  Aged Out    Health Maintenance  Health Maintenance Due  Topic Date Due   COVID-19 Vaccine (4 - Booster for Pfizer series) 07/02/2020   Zoster Vaccines- Shingrix (2 of 2) 03/24/2021    Colorectal cancer screening: No longer required.   Mammogram status: Completed  . Repeat every year  Bone Density Osteopenia every 2 years 2022 completed  Lung Cancer Screening: (Low Dose CT Chest recommended if Age 44-80 years, 30 pack-year currently smoking OR have quit w/in 15years.) does not qualify.   Lung Cancer Screening Referral:   Additional Screening:  Hepatitis C Screening: does not qualify;   Vision Screening: Recommended annual ophthalmology exams for early detection of glaucoma and other disorders of the eye. Is the patient up to date with their annual eye exam?  Yes  Who is the provider or what is the name of the office in which the patient attends annual eye exams? Dr. Zigmund Daniel If pt is not established with a provider, would they like to be referred to a provider to establish care? No .   Dental Screening: Recommended annual dental exams for proper oral hygiene  Community Resource Referral / Chronic Care Management: CRR required this visit?  No   CCM required this visit?  No      Plan:     I have personally reviewed and noted the following in the patient's chart:   Medical and social history Use of alcohol, tobacco or illicit drugs  Current medications and supplements including opioid prescriptions.  Functional ability and  status Nutritional status Physical activity Advanced directives List of other physicians Hospitalizations, surgeries, and ER visits in previous 12 months Vitals Screenings to include cognitive, depression, and falls Referrals and appointments  In addition, I have reviewed and discussed with patient certain preventive protocols, quality metrics, and best practice recommendations. A written personalized care plan for preventive services as well as general preventive health recommendations were provided to patient.     Leroy Kennedy, LPN   [id number]/1007   Nurse Notes:

## 2021-05-27 NOTE — Patient Instructions (Signed)
Michelle [name] , Thank you for taking time to come for your Medicare Wellness Visit. I appreciate your ongoing commitment to your health goals. Please review the following plan we discussed and let me know if I can assist you in the future.   Screening recommendations/referrals: Colonoscopy: No longer indicated Mammogram: up to date Bone Density: up to date Recommended yearly ophthalmology/optometry visit for glaucoma screening and checkup Recommended yearly dental visit for hygiene and checkup  Vaccinations: Influenza vaccine: up to date Pneumococcal vaccine: up to date Tdap vaccine: up to date Shingles vaccine: Education provided     Advanced directives: Education provided  Conditions/risks identified:   Next appointment: 08-13-2021 @ 10:00 Dr. Gena Fray   Preventive Care 82 Years and Older, Female Preventive care refers to lifestyle choices and visits with your health care provider that can promote health and wellness. What does preventive care include? A yearly physical exam. This is also called an annual well check. Dental exams once or twice a year. Routine eye exams. Ask your health care provider how often you should have your eyes checked. Personal lifestyle choices, including: Daily care of your teeth and gums. Regular physical activity. Eating a healthy diet. Avoiding tobacco and drug use. Limiting alcohol use. Practicing safe sex. Taking low-dose aspirin every day. Taking vitamin and mineral supplements as recommended by your health care provider. What happens during an annual well check? The services and screenings done by your health care provider during your annual well check will depend on your age, overall health, lifestyle risk factors, and family history of disease. Counseling  Your health care provider may ask you questions about your: Alcohol use. Tobacco use. Drug use. Emotional well-being. Home and relationship well-being. Sexual activity. Eating  habits. History of falls. Memory and ability to understand (cognition). Work and work Statistician. Reproductive health. Screening  You may have the following tests or measurements: Height, weight, and BMI. Blood pressure. Lipid and cholesterol levels. These may be checked every 5 years, or more frequently if you are over 82 years old. Skin check. Lung cancer screening. You may have this screening every year starting at age 82 if you have a 30-pack-year history of smoking and currently smoke or have quit within the past 15 years. Fecal occult blood test (FOBT) of the stool. You may have this test every year starting at age 82. Flexible sigmoidoscopy or colonoscopy. You may have a sigmoidoscopy every 5 years or a colonoscopy every 10 years starting at age 82. Hepatitis C blood test. Hepatitis B blood test. Sexually transmitted disease (STD) testing. Diabetes screening. This is done by checking your blood sugar (glucose) after you have not eaten for a while (fasting). You may have this done every 1-3 years. Bone density scan. This is done to screen for osteoporosis. You may have this done starting at age 82. Mammogram. This may be done every 1-2 years. Talk to your health care provider about how often you should have regular mammograms. Talk with your health care provider about your test results, treatment options, and if necessary, the need for more tests. Vaccines  Your health care provider may recommend certain vaccines, such as: Influenza vaccine. This is recommended every year. Tetanus, diphtheria, and acellular pertussis (Tdap, Td) vaccine. You may need a Td booster every 10 years. Zoster vaccine. You may need this after age 82. Pneumococcal 13-valent conjugate (PCV13) vaccine. One dose is recommended after age 82. Pneumococcal polysaccharide (PPSV23) vaccine. One dose is recommended after age 82. Talk to your health  care provider about which screenings and vaccines you need and how  often you need them. This information is not intended to replace advice given to you by your health care provider. Make sure you discuss any questions you have with your health care provider. Document Released: 08/09/2015 Document Revised: 04/01/2016 Document Reviewed: 05/14/2015 Elsevier Interactive Patient Education  2017 [place] Prevention in the Home Falls can cause injuries. They can happen to people of all ages. There are many things you can do to make your home safe and to help prevent falls. What can I do on the outside of my home? Regularly fix the edges of walkways and driveways and fix any cracks. Remove anything that might make you trip as you walk through a door, such as a raised step or threshold. Trim any bushes or trees on the path to your home. Use bright outdoor lighting. Clear any walking paths of anything that might make someone trip, such as rocks or tools. Regularly check to see if handrails are loose or broken. Make sure that both sides of any steps have handrails. Any raised decks and porches should have guardrails on the edges. Have any leaves, snow, or ice cleared regularly. Use sand or salt on walking paths during winter. Clean up any spills in your garage right away. This includes oil or grease spills. What can I do in the bathroom? Use night lights. Install grab bars by the toilet and in the tub and shower. Do not use towel bars as grab bars. Use non-skid mats or decals in the tub or shower. If you need to sit down in the shower, use a plastic, non-slip stool. Keep the floor dry. Clean up any water that spills on the floor as soon as it happens. Remove soap buildup in the tub or shower regularly. Attach bath mats securely with double-sided non-slip rug tape. Do not have throw rugs and other things on the floor that can make you trip. What can I do in the bedroom? Use night lights. Make sure that you have a light by your bed that is easy to  reach. Do not use any sheets or blankets that are too big for your bed. They should not hang down onto the floor. Have a firm chair that has side arms. You can use this for support while you get dressed. Do not have throw rugs and other things on the floor that can make you trip. What can I do in the kitchen? Clean up any spills right away. Avoid walking on wet floors. Keep items that you use a lot in easy-to-reach places. If you need to reach something above you, use a strong step stool that has a grab bar. Keep electrical cords out of the way. Do not use floor polish or wax that makes floors slippery. If you must use wax, use non-skid floor wax. Do not have throw rugs and other things on the floor that can make you trip. What can I do with my stairs? Do not leave any items on the stairs. Make sure that there are handrails on both sides of the stairs and use them. Fix handrails that are broken or loose. Make sure that handrails are as long as the stairways. Check any carpeting to make sure that it is firmly attached to the stairs. Fix any carpet that is loose or worn. Avoid having throw rugs at the top or bottom of the stairs. If you do have throw rugs, attach them to  the floor with carpet tape. Make sure that you have a light switch at the top of the stairs and the bottom of the stairs. If you do not have them, ask someone to add them for you. What else can I do to help prevent falls? Wear shoes that: Do not have high heels. Have rubber bottoms. Are comfortable and fit you well. Are closed at the toe. Do not wear sandals. If you use a stepladder: Make sure that it is fully opened. Do not climb a closed stepladder. Make sure that both sides of the stepladder are locked into place. Ask someone to hold it for you, if possible. Clearly mark and make sure that you can see: Any grab bars or handrails. First and last steps. Where the edge of each step is. Use tools that help you move  around (mobility aids) if they are needed. These include: Canes. Walkers. Scooters. Crutches. Turn on the lights when you go into a dark area. Replace any light bulbs as soon as they burn out. Set up your furniture so you have a clear path. Avoid moving your furniture around. If any of your floors are uneven, fix them. If there are any pets around you, be aware of where they are. Review your medicines with your doctor. Some medicines can make you feel dizzy. This can increase your chance of falling. Ask your doctor what other things that you can do to help prevent falls. This information is not intended to replace advice given to you by your health care provider. Make sure you discuss any questions you have with your health care provider. Document Released: 05/09/2009 Document Revised: 12/19/2015 Document Reviewed: 08/17/2014 Elsevier Interactive Patient Education  2017 [employer].

## 2021-06-02 ENCOUNTER — Encounter (INDEPENDENT_AMBULATORY_CARE_PROVIDER_SITE_OTHER): Payer: Medicare HMO | Admitting: Ophthalmology

## 2021-06-02 ENCOUNTER — Other Ambulatory Visit: Payer: Self-pay

## 2021-06-02 DIAGNOSIS — H353134 Nonexudative age-related macular degeneration, bilateral, advanced atrophic with subfoveal involvement: Secondary | ICD-10-CM

## 2021-06-02 DIAGNOSIS — I10 Essential (primary) hypertension: Secondary | ICD-10-CM

## 2021-06-02 DIAGNOSIS — H43813 Vitreous degeneration, bilateral: Secondary | ICD-10-CM | POA: Diagnosis not present

## 2021-06-02 DIAGNOSIS — H35033 Hypertensive retinopathy, bilateral: Secondary | ICD-10-CM | POA: Diagnosis not present

## 2021-07-05 ENCOUNTER — Other Ambulatory Visit: Payer: Self-pay | Admitting: Family Medicine

## 2021-07-05 DIAGNOSIS — I10 Essential (primary) hypertension: Secondary | ICD-10-CM

## 2021-07-06 ENCOUNTER — Encounter: Payer: Self-pay | Admitting: Family Medicine

## 2021-07-06 DIAGNOSIS — G3184 Mild cognitive impairment, so stated: Secondary | ICD-10-CM

## 2021-07-07 MED ORDER — DONEPEZIL HCL 5 MG PO TABS
5.0000 mg | ORAL_TABLET | Freq: Every day | ORAL | 3 refills | Status: DC
Start: 2021-07-07 — End: 2021-11-07

## 2021-07-08 ENCOUNTER — Other Ambulatory Visit: Payer: Self-pay | Admitting: Nephrology

## 2021-07-08 DIAGNOSIS — N183 Chronic kidney disease, stage 3 unspecified: Secondary | ICD-10-CM

## 2021-07-08 DIAGNOSIS — I10 Essential (primary) hypertension: Secondary | ICD-10-CM

## 2021-07-14 ENCOUNTER — Ambulatory Visit
Admission: RE | Admit: 2021-07-14 | Discharge: 2021-07-14 | Disposition: A | Discharge status: Home / Self Care | Payer: Medicare HMO | Source: Ambulatory Visit | Attending: Nephrology | Admitting: Nephrology

## 2021-07-14 DIAGNOSIS — I10 Essential (primary) hypertension: Secondary | ICD-10-CM

## 2021-07-14 DIAGNOSIS — N183 Chronic kidney disease, stage 3 unspecified: Secondary | ICD-10-CM

## 2021-07-29 ENCOUNTER — Other Ambulatory Visit: Payer: Self-pay | Admitting: Family Medicine

## 2021-07-29 DIAGNOSIS — I10 Essential (primary) hypertension: Secondary | ICD-10-CM

## 2021-08-06 ENCOUNTER — Emergency Department (HOSPITAL_COMMUNITY): Payer: Medicare HMO

## 2021-08-06 ENCOUNTER — Inpatient Hospital Stay (HOSPITAL_COMMUNITY)
Admission: EM | Admit: 2021-08-06 | Discharge: 2021-08-08 | DRG: 689 | Disposition: A | Discharge status: Home / Self Care | Payer: Medicare HMO | Attending: Internal Medicine | Admitting: Internal Medicine

## 2021-08-06 ENCOUNTER — Other Ambulatory Visit: Payer: Self-pay

## 2021-08-06 ENCOUNTER — Encounter (HOSPITAL_COMMUNITY): Payer: Self-pay | Admitting: *Deleted

## 2021-08-06 DIAGNOSIS — R410 Disorientation, unspecified: Secondary | ICD-10-CM | POA: Diagnosis not present

## 2021-08-06 DIAGNOSIS — I10 Essential (primary) hypertension: Secondary | ICD-10-CM | POA: Diagnosis present

## 2021-08-06 DIAGNOSIS — N390 Urinary tract infection, site not specified: Secondary | ICD-10-CM | POA: Diagnosis not present

## 2021-08-06 DIAGNOSIS — Z833 Family history of diabetes mellitus: Secondary | ICD-10-CM

## 2021-08-06 DIAGNOSIS — K219 Gastro-esophageal reflux disease without esophagitis: Secondary | ICD-10-CM | POA: Diagnosis present

## 2021-08-06 DIAGNOSIS — N183 Chronic kidney disease, stage 3 unspecified: Secondary | ICD-10-CM | POA: Diagnosis present

## 2021-08-06 DIAGNOSIS — Z20822 Contact with and (suspected) exposure to covid-19: Secondary | ICD-10-CM | POA: Diagnosis present

## 2021-08-06 DIAGNOSIS — I129 Hypertensive chronic kidney disease with stage 1 through stage 4 chronic kidney disease, or unspecified chronic kidney disease: Secondary | ICD-10-CM | POA: Diagnosis present

## 2021-08-06 DIAGNOSIS — Z79899 Other long term (current) drug therapy: Secondary | ICD-10-CM

## 2021-08-06 DIAGNOSIS — Z823 Family history of stroke: Secondary | ICD-10-CM

## 2021-08-06 DIAGNOSIS — M810 Age-related osteoporosis without current pathological fracture: Secondary | ICD-10-CM | POA: Diagnosis present

## 2021-08-06 DIAGNOSIS — N3000 Acute cystitis without hematuria: Secondary | ICD-10-CM | POA: Diagnosis not present

## 2021-08-06 DIAGNOSIS — G9341 Metabolic encephalopathy: Secondary | ICD-10-CM | POA: Diagnosis present

## 2021-08-06 DIAGNOSIS — Z8249 Family history of ischemic heart disease and other diseases of the circulatory system: Secondary | ICD-10-CM

## 2021-08-06 DIAGNOSIS — N1831 Chronic kidney disease, stage 3a: Secondary | ICD-10-CM | POA: Diagnosis present

## 2021-08-06 DIAGNOSIS — G3184 Mild cognitive impairment, so stated: Secondary | ICD-10-CM | POA: Diagnosis present

## 2021-08-06 DIAGNOSIS — H3530 Unspecified macular degeneration: Secondary | ICD-10-CM | POA: Diagnosis present

## 2021-08-06 DIAGNOSIS — Z7982 Long term (current) use of aspirin: Secondary | ICD-10-CM

## 2021-08-06 DIAGNOSIS — H548 Legal blindness, as defined in USA: Secondary | ICD-10-CM | POA: Diagnosis present

## 2021-08-06 DIAGNOSIS — B9620 Unspecified Escherichia coli [E. coli] as the cause of diseases classified elsewhere: Secondary | ICD-10-CM | POA: Diagnosis present

## 2021-08-06 DIAGNOSIS — Z87891 Personal history of nicotine dependence: Secondary | ICD-10-CM

## 2021-08-06 DIAGNOSIS — E869 Volume depletion, unspecified: Secondary | ICD-10-CM | POA: Diagnosis present

## 2021-08-06 DIAGNOSIS — F0390 Unspecified dementia without behavioral disturbance: Secondary | ICD-10-CM | POA: Diagnosis present

## 2021-08-06 LAB — RESP PANEL BY RT-PCR (FLU A&B, COVID) ARPGX2
Influenza A by PCR: NEGATIVE
Influenza B by PCR: NEGATIVE
SARS Coronavirus 2 by RT PCR: NEGATIVE

## 2021-08-06 LAB — CBC
HCT: 39.1 % (ref 36.0–46.0)
Hemoglobin: 13.4 g/dL (ref 12.0–15.0)
MCH: 30.9 pg (ref 26.0–34.0)
MCHC: 34.3 g/dL (ref 30.0–36.0)
MCV: 90.1 fL (ref 80.0–100.0)
Platelets: 181 10*3/uL (ref 150–400)
RBC: 4.34 MIL/uL (ref 3.87–5.11)
RDW: 12.3 % (ref 11.5–15.5)
WBC: 11 10*3/uL — ABNORMAL HIGH (ref 4.0–10.5)
nRBC: 0 % (ref 0.0–0.2)

## 2021-08-06 LAB — URINALYSIS, ROUTINE W REFLEX MICROSCOPIC
Bilirubin Urine: NEGATIVE
Glucose, UA: NEGATIVE mg/dL
Ketones, ur: NEGATIVE mg/dL
Nitrite: POSITIVE — AB
Protein, ur: 100 mg/dL — AB
Specific Gravity, Urine: 1.008 (ref 1.005–1.030)
WBC, UA: >50 WBC/hpf — ABNORMAL HIGH (ref 0–5)
pH: 5 (ref 5.0–8.0)

## 2021-08-06 LAB — BASIC METABOLIC PANEL
Anion gap: 8 (ref 5–15)
BUN: 23 mg/dL (ref 8–23)
CO2: 23 mmol/L (ref 22–32)
Calcium: 9.4 mg/dL (ref 8.9–10.3)
Chloride: 106 mmol/L (ref 98–111)
Creatinine, Ser: 1.17 mg/dL — ABNORMAL HIGH (ref 0.44–1.00)
GFR, Estimated: 47 mL/min — ABNORMAL LOW (ref >60)
Glucose, Bld: 104 mg/dL — ABNORMAL HIGH (ref 70–99)
Potassium: 3.9 mmol/L (ref 3.5–5.1)
Sodium: 137 mmol/L (ref 135–145)

## 2021-08-06 LAB — CBG MONITORING, ED: Glucose-Capillary: 93 mg/dL (ref 70–99)

## 2021-08-06 LAB — LACTIC ACID, PLASMA
Lactic Acid, Venous: 1.3 mmol/L (ref 0.5–1.9)
Lactic Acid, Venous: 0.9 mmol/L (ref 0.5–1.9)

## 2021-08-06 MED ORDER — SODIUM CHLORIDE 0.9 % IV BOLUS
500.0000 mL | Freq: Once | INTRAVENOUS | Status: AC
  Administered 2021-08-06: 500 mL via INTRAVENOUS

## 2021-08-06 MED ORDER — CEFTRIAXONE SODIUM 1 G IJ SOLR
1.0000 g | Freq: Once | INTRAMUSCULAR | Status: AC
  Administered 2021-08-06: 1 g via INTRAVENOUS
  Filled 2021-08-06: qty 10

## 2021-08-06 MED ORDER — PANTOPRAZOLE SODIUM 40 MG PO TBEC
40.0000 mg | DELAYED_RELEASE_TABLET | Freq: Every day | ORAL | Status: DC
  Administered 2021-08-07 – 2021-08-08 (×2): 40 mg via ORAL
  Filled 2021-08-06 (×3): qty 1

## 2021-08-06 MED ORDER — HYDRALAZINE HCL 20 MG/ML IJ SOLN: 10.0000 mg | Freq: Four times a day (QID) | INTRAMUSCULAR | Status: DC | PRN

## 2021-08-06 MED ORDER — LISINOPRIL 20 MG PO TABS
20.0000 mg | ORAL_TABLET | Freq: Two times a day (BID) | ORAL | Status: DC
  Administered 2021-08-06 – 2021-08-07 (×2): 20 mg via ORAL
  Filled 2021-08-06: qty 1
  Filled 2021-08-06: qty 2
  Filled 2021-08-06: qty 1

## 2021-08-06 MED ORDER — ACETAMINOPHEN 325 MG PO TABS
650.0000 mg | ORAL_TABLET | Freq: Four times a day (QID) | ORAL | Status: DC | PRN
  Administered 2021-08-06 – 2021-08-07 (×2): 650 mg via ORAL
  Filled 2021-08-06 (×2): qty 2

## 2021-08-06 MED ORDER — SODIUM CHLORIDE 0.9 % IV SOLN
1.0000 g | INTRAVENOUS | Status: DC
  Administered 2021-08-07: 1 g via INTRAVENOUS
  Filled 2021-08-06 (×2): qty 10

## 2021-08-06 MED ORDER — DONEPEZIL HCL 10 MG PO TABS
5.0000 mg | ORAL_TABLET | Freq: Every day | ORAL | Status: DC
  Administered 2021-08-06 – 2021-08-07 (×2): 5 mg via ORAL
  Filled 2021-08-06 (×3): qty 1

## 2021-08-06 MED ORDER — ASPIRIN EC 81 MG PO TBEC: 81.0000 mg | DELAYED_RELEASE_TABLET | Freq: Every day | ORAL | Status: DC

## 2021-08-06 MED ORDER — CLONIDINE HCL 0.1 MG PO TABS
0.1000 mg | ORAL_TABLET | Freq: Two times a day (BID) | ORAL | Status: DC
  Administered 2021-08-06 – 2021-08-07 (×2): 0.1 mg via ORAL
  Filled 2021-08-06 (×3): qty 1

## 2021-08-06 MED ORDER — ONDANSETRON HCL 4 MG PO TABS: 4.0000 mg | ORAL_TABLET | Freq: Four times a day (QID) | ORAL | Status: DC | PRN

## 2021-08-06 MED ORDER — SODIUM CHLORIDE 0.9 % IV SOLN: INTRAVENOUS | Status: AC

## 2021-08-06 MED ORDER — ONDANSETRON HCL 4 MG/2ML IJ SOLN: 4.0000 mg | Freq: Four times a day (QID) | INTRAMUSCULAR | Status: DC | PRN

## 2021-08-06 MED ORDER — SENNOSIDES-DOCUSATE SODIUM 8.6-50 MG PO TABS: 1.0000 | ORAL_TABLET | Freq: Every evening | ORAL | Status: DC | PRN

## 2021-08-06 MED ORDER — ACETAMINOPHEN 650 MG RE SUPP: 650.0000 mg | Freq: Four times a day (QID) | RECTAL | Status: DC | PRN

## 2021-08-06 MED ORDER — ENOXAPARIN SODIUM 40 MG/0.4ML IJ SOSY
40.0000 mg | PREFILLED_SYRINGE | INTRAMUSCULAR | Status: DC
  Administered 2021-08-07 – 2021-08-08 (×2): 40 mg via SUBCUTANEOUS
  Filled 2021-08-06 (×2): qty 0.4

## 2021-08-06 MED ORDER — SODIUM CHLORIDE 0.9% FLUSH
3.0000 mL | Freq: Two times a day (BID) | INTRAVENOUS | Status: DC
  Administered 2021-08-06 – 2021-08-08 (×3): 3 mL via INTRAVENOUS

## 2021-08-06 NOTE — H&P (Signed)
[facility] History and Physical  Michelle [name] JKK:[id number] DOB: [birth year] DOA: 08/06/2021  Referring physician: ED  PCP: Haydee Salter, MD   Patient is coming from: Home  Chief Complaint: Confusion  HPI: Michelle [name] is a 81 y.o. female past medical history of hypertension, mild cognitive impairment, legal blindness, presented to hospital with weakness chills and rigor with altered mental status and confusion from home.  Patient's husband reported that that she did have episode of chills weakness and slurring of the speech with fever yesterday at home but today has been feeling better but has been was concerned that she may have a blood infection so he brought her to the hospital.  Of note patient did have history of bacteremia in the past from previous hospitalization.  Patient denied any shortness of breath, chest pain, dizziness, nausea, vomiting, abdominal pain.  Patient denies any syncopal event.  Denies any urinary urgency frequency or dysuria.  Denies any sick contacts or recent travel.  ED Course: In the ED, patient was afebrile with normal vitals.  Urinalysis was positive for hemoglobin protein nitrite leukocyte and white cells were more than 50.  Lactate was 1.3.  COVID and influenza was negative. WBC elevated at 11.0.  Due to confusion and subjective chills and rigor and history of bacteremia patient was considered for admission to the hospital for further evaluation and treatment.  Review of Systems:  All systems were reviewed and were negative unless otherwise mentioned in the HPI  Past Medical History:  Diagnosis Date   Allergic rhinitis    Cystocele    Fibromuscular dysplasia of renal artery (HCC)    Hypertension    fibromuscular dysplasia   Osteoarthritis    Osteoporosis    PMS (premenstrual syndrome)    Retinal hemorrhage, right    with macular degeneration   Past Surgical History:  Procedure Laterality Date   ANGIOPLASTY     right renal 1981    BTL     cb x 3     HEMORRHOID SURGERY     IR PTA RENAL VISCERAL  05/11/2017   IR RADIOLOGIST EVAL & MGMT  04/21/2017   IR RENAL BILAT S&I MOD SED  05/11/2017   IR US GUIDE VASC ACCESS RIGHT  05/11/2017   Rt ovarian cyst removed     TONSILLECTOMY AND ADENOIDECTOMY      Social History:  reports that she has quit smoking. She quit smokeless tobacco use about 37 years ago. She reports that she does not drink alcohol and does not use drugs.  Allergies  Allergen Reactions   Iodinated Contrast Media Hives    Patient believes this was an older form of iodinated contrast.  Tolerated OMNIPAQUE IOHEXOL 11/19/14 for renal arteriogram/angioplasty without prep. [name], RN   [place]    Diphenhydramine Hcl Hives    Family History  Problem Relation Age of Onset   Diabetes Mother    Hypertension Mother    Stroke Brother    Diabetes Maternal Aunt      Prior to Admission medications   Medication Sig Start Date End Date Taking? Authorizing Provider  aspirin EC 81 MG tablet Take 81 mg by mouth daily.    [provider]  beta carotene w/minerals (OCUVITE) tablet Take 1 tablet by mouth daily.    [provider]  Bevacizumab (AVASTIN) 100 MG/4ML SOLN Inject 1-10 mg into the eye See admin instructions. For eyes One every 6 weeks 1/10 mg    [provider]  calcium-vitamin D (OSCAL WITH D) 500-200 MG-UNIT tablet Take 1 tablet by mouth daily with breakfast. 05/12/21   Haydee Salter, MD  cetirizine (ZYRTEC) 10 MG tablet Take 10 mg by mouth daily as needed for allergies.    [provider]  cloNIDine (CATAPRES) 0.1 MG tablet TAKE 1 TABLET BY MOUTH 2 TIMES DAILY. 03/10/21   Libby Maw, MD  donepezil (ARICEPT) 5 MG tablet Take 1 tablet (5 mg total) by mouth at bedtime. 07/07/21   Haydee Salter, MD  ibuprofen (ADVIL) 200 MG tablet Take 200 mg by mouth every 6 (six) hours as needed.    [provider]  lisinopril (ZESTRIL) 20 MG tablet TAKE 1 TABLET  BY MOUTH TWICE A DAY 07/29/21   Haydee Salter, MD  omeprazole (PRILOSEC) 20 MG capsule Take 20 mg by mouth daily before breakfast.    [provider]    Physical Exam: Vitals:   08/06/21 1630 08/06/21 1636 08/06/21 1640 08/06/21 1731  BP:  (!) 174/71  100/86  Pulse: 77 76 82 65  Resp:    16  Temp:      TempSrc:      SpO2: 97% 100% 97% 98%  Weight:      Height:       Wt Readings from Last 3 Encounters:  08/06/21 65.8 kg  05/12/21 66 kg  03/11/21 65.2 kg   Body mass index is 25.69 kg/m.  General:  Average built, not in obvious distress, HENT: Normocephalic, pupils equally reacting to light and accommodation.  No scleral pallor or icterus noted. Oral mucosa is moist.  Legally blind. Chest:  Clear breath sounds.  Diminished breath sounds bilaterally. No crackles or wheezes.  CVS: S1 &S2 heard. No murmur.  Regular rate and rhythm. Abdomen: Soft, nontender, nondistended.  Bowel sounds are heard.  Extremities: No cyanosis, clubbing or edema.  Peripheral pulses are palpable. Psych: Alert, awake and oriented to place and person.  Disoriented to time, has impaired short-term memory. CNS:  No cranial nerve deficits.  Power equal in all extremities.    Skin: Warm and dry.  No rashes noted.  Mildly diminished skin turgor.  Labs on Admission:   CBC: Recent Labs  Lab 08/06/21 1143  WBC 11.0*  HGB 13.4  HCT 39.1  MCV 90.1  PLT 915    Basic Metabolic Panel: Recent Labs  Lab 08/06/21 1143  NA 137  K 3.9  CL 106  CO2 23  GLUCOSE 104*  BUN 23  CREATININE 1.17*  CALCIUM 9.4    Liver Function Tests: No results for input(s): AST, ALT, ALKPHOS, BILITOT, PROT, ALBUMIN in the last 168 hours. No results for input(s): LIPASE, AMYLASE in the last 168 hours. No results for input(s): AMMONIA in the last 168 hours.  Cardiac Enzymes: No results for input(s): CKTOTAL, CKMB, CKMBINDEX, TROPONINI in the last 168 hours.  BNP (last 3 results) No results for input(s): BNP in  the last 8760 hours.  ProBNP (last 3 results) No results for input(s): PROBNP in the last 8760 hours.  CBG: Recent Labs  Lab 08/06/21 1134  GLUCAP 93    Lipase  No results found for: LIPASE   Urinalysis    Component Value Date/Time   COLORURINE YELLOW 08/06/2021 1558   APPEARANCEUR CLOUDY (A) 08/06/2021 1558   LABSPEC 1.008 08/06/2021 1558   PHURINE 5.0 08/06/2021 1558   GLUCOSEU NEGATIVE 08/06/2021 1558   HGBUR MODERATE (A) 08/06/2021 1558   HGBUR negative 10/24/2009  [place] 08/06/2021 1558   BILIRUBINUR n 11/01/2013 1113   KETONESUR NEGATIVE 08/06/2021 1558   PROTEINUR 100 (A) 08/06/2021 1558   UROBILINOGEN 0.2 11/01/2013 1113   UROBILINOGEN 0.2 10/24/2009 0818   NITRITE POSITIVE (A) 08/06/2021 1558   LEUKOCYTESUR LARGE (A) 08/06/2021 1558     Drugs of Abuse  No results found for: LABOPIA, COCAINSCRNUR, LABBENZ, AMPHETMU, THCU, LABBARB    Radiological Exams on Admission: DG Chest 2 View  Result Date: 08/06/2021 CLINICAL DATA:  Husband states pt has been having chills, weakness and some slurred speech. Symptoms have been going on greater then 24 hours. Pt without deficits in triage - to eval for fever.fever EXAM: CHEST - 2 VIEW COMPARISON:  None. FINDINGS: Normal mediastinum and cardiac silhouette. Normal pulmonary vasculature. No evidence of effusion, infiltrate, or pneumothorax. No acute bony abnormality. IMPRESSION: No acute cardiopulmonary process. Electronically Signed   By: Suzy Bouchard M.D.   On: 08/06/2021 14:42   CT Head Wo Contrast  Result Date: 08/06/2021 CLINICAL DATA:  Mental status change. EXAM: CT HEAD WITHOUT CONTRAST TECHNIQUE: Contiguous axial images were obtained from the base of the skull through the vertex without intravenous contrast. RADIATION DOSE REDUCTION: This exam was performed according to the departmental dose-optimization program which includes automated exposure control, adjustment of the mA and/or kV according to  patient size and/or use of iterative reconstruction technique. COMPARISON:  CT head dated Nov 30, 2020 FINDINGS: Brain: No evidence of acute infarction, hemorrhage, hydrocephalus. 1.5 cm calcified mass in the pineal region, which may represent meningioma as seen on prior examinations is unchanged. No evidence of hydrocephalus or edema. Vascular: No hyperdense vessel or unexpected calcification. Skull: Normal. Negative for fracture or focal lesion. Sinuses/Orbits: Bilateral cataract surgery. Mild mucosal thickening of the ethmoid air cells. Other: None. IMPRESSION: 1.  No acute intracranial abnormality. 2. Stable calcified mass in the pineal region, likely meningioma, unchanged. Electronically Signed   By: Keane Police D.O.   On: 08/06/2021 14:38    EKG: Personally reviewed by me which shows sinus rhythm   Assessment/Plan Principal Problem:   UTI (urinary tract infection) Active Problems:   Essential hypertension   Mild cognitive impairment with memory loss   Stage 3 chronic kidney disease (HCC)   Gastroesophageal reflux disease   Confusion  Confusion likely metabolic encephalopathy from UTI.  Continue to monitor.  Clearing as per the patient's husband.  Influenza and COVID was negative.  UTI.  History of bacteremia.  Blood cultures have been sent including urine cultures.  On empiric IV Rocephin.  We will continue for now.  Follow culture and sensitivity.  stage III chronic kidney disease.  We will continue to monitor closely.  Essential hypertension.  History of renal artery stenosis status post stent.  Continue to monitor closely.  On clonidine and lisinopril from home.   Mild cognitive impairment with memory loss.  History of hallucinations.  Will provide supportive care.  Treat underlying infection.  Continue donepezil  GERD.  Continue PPI  Mild volume depletion.  Continue IV fluids overnight.  Check hydration status in AM.  DVT Prophylaxis: Lovenox subcu  Consultant: None  Code  Status: Full code  Microbiology blood culture and urine cultures sent from the ED  Antibiotics: IV Rocephin  Family Communication:  Patients' condition and plan of care including tests being ordered have been discussed with the patient  who indicate understanding and agree with the plan.   Severity of Illness: The appropriate patient status for  this patient is OBSERVATION. Observation status is judged to be reasonable and necessary in order to provide the required intensity of service to ensure the patient's safety. The patient's presenting symptoms, physical exam findings, and initial radiographic and laboratory data in the context of their medical condition is felt to place them at decreased risk for further clinical deterioration. Furthermore, it is anticipated that the patient will be medically stable for discharge from the hospital within 2 midnights of admission.   Signed, Flora Lipps, MD [facility] 08/06/2021

## 2021-08-06 NOTE — Plan of Care (Signed)

## 2021-08-06 NOTE — ED Provider Notes (Signed)
[facility] DEPT Provider Note   CSN: [id number] Arrival date & time: 08/06/21  1121     History  Chief Complaint  Patient presents with   Weakness   Chills   Altered Mental Status    STEFHANIE [name] is a 81 y.o. female.  81 year old female with prior medical history as detailed below presents for evaluation.  She is accompanied by her husband.  Husband reports that yesterday afternoon the patient had an episode of chills, generalized weakness, and appeared to be slurring her speech.  The symptoms improved within several hours.  Patient was noted to have a subjective fever at the same time.  Today the patient is asymptomatic.  She feels significantly better than yesterday.  The patient's husband is concerned that she may have an infection.  She has a history of bacteremia.  She denies associated chest pain or shortness of breath.  She denies URI symptoms.  She denies cough.  Denies abdominal pain or difficulty urinating.  The history is provided by the patient.  Illness Location:  Myalgia, generalized weakness, chills, subjective fever, transient mental status change Severity:  Mild Onset quality:  Gradual Duration:  1 day Timing:  Constant Progression:  Waxing and waning Chronicity:  New     Home Medications Prior to Admission medications   Medication Sig Start Date End Date Taking? Authorizing Provider  aspirin EC 81 MG tablet Take 81 mg by mouth daily.    [provider]  beta carotene w/minerals (OCUVITE) tablet Take 1 tablet by mouth daily.    [provider]  Bevacizumab (AVASTIN) 100 MG/4ML SOLN Inject 1-10 mg into the eye See admin instructions. For eyes One every 6 weeks 1/10 mg    [provider]  calcium-vitamin D (OSCAL WITH D) 500-200 MG-UNIT tablet Take 1 tablet by mouth daily with breakfast. 05/12/21   Haydee Salter, MD  cetirizine (ZYRTEC) 10 MG tablet Take 10 mg by mouth daily as needed for  allergies.    [provider]  cloNIDine (CATAPRES) 0.1 MG tablet TAKE 1 TABLET BY MOUTH 2 TIMES DAILY. 03/10/21   Libby Maw, MD  donepezil (ARICEPT) 5 MG tablet Take 1 tablet (5 mg total) by mouth at bedtime. 07/07/21   Haydee Salter, MD  ibuprofen (ADVIL) 200 MG tablet Take 200 mg by mouth every 6 (six) hours as needed.    [provider]  lisinopril (ZESTRIL) 20 MG tablet TAKE 1 TABLET BY MOUTH TWICE A DAY 07/29/21   Haydee Salter, MD  omeprazole (PRILOSEC) 20 MG capsule Take 20 mg by mouth daily before breakfast.    [provider]      Allergies    Iodinated contrast media, [place], and Diphenhydramine hcl    Review of Systems   Review of Systems  All other systems reviewed and are negative.  Physical Exam Updated Vital Signs BP (!) 140/59    Pulse (!) 58    Temp 97.8 F (36.6 C) (Oral)    Resp 16    Ht 5\' 3"  (1.6 m)    Wt 65.8 kg    SpO2 98%    BMI 25.69 kg/m  Physical Exam Vitals and nursing note reviewed.  Constitutional:      General: She is not in acute distress.    Appearance: Normal appearance. She is well-developed.  HENT:     Head: Normocephalic and atraumatic.  Eyes:     Conjunctiva/sclera: Conjunctivae normal.  Pupils: Pupils are equal, round, and reactive to light.  Cardiovascular:     Rate and Rhythm: Normal rate and regular rhythm.     Heart sounds: Normal heart sounds.  Pulmonary:     Effort: Pulmonary effort is normal. No respiratory distress.     Breath sounds: Normal breath sounds.  Abdominal:     General: There is no distension.     Palpations: Abdomen is soft.     Tenderness: There is no abdominal tenderness.  Musculoskeletal:        General: No deformity. Normal range of motion.     Cervical back: Normal range of motion and neck supple.  Skin:    General: Skin is warm and dry.  Neurological:     General: No focal deficit present.     Mental Status: She is alert and oriented to person, place, and time.  Mental status is at baseline.    ED Results / Procedures / Treatments   Labs (all labs ordered are listed, but only abnormal results are displayed) Labs Reviewed  BASIC METABOLIC PANEL - Abnormal; Notable for the following components:      Result Value   Glucose, Bld 104 (*)    Creatinine, Ser 1.17 (*)    GFR, Estimated 47 (*)    All other components within normal limits  CBC - Abnormal; Notable for the following components:   WBC 11.0 (*)    All other components within normal limits  RESP PANEL BY RT-PCR (FLU A&B, COVID) ARPGX2  CULTURE, BLOOD (ROUTINE X 2)  CULTURE, BLOOD (ROUTINE X 2)  URINALYSIS, ROUTINE W REFLEX MICROSCOPIC  LACTIC ACID, PLASMA  LACTIC ACID, PLASMA  CBG MONITORING, ED    EKG EKG Interpretation  Date/Time:  Wednesday August 06 2021 11:26:46 EST Ventricular Rate:  63 PR Interval:  193 QRS Duration: 85 QT Interval:  396 QTC Calculation: 406 R Axis:   -51 Text Interpretation: Sinus rhythm Left anterior fascicular block Low voltage, precordial leads Consider anterior infarct Confirmed by Dene Gentry [phone]) on 08/06/2021 1:33:55 PM  Radiology No results found.  Procedures Procedures    Medications Ordered in ED Medications  sodium chloride 0.9 % bolus 500 mL (has no administration in time range)    ED Course/ Medical Decision Making/ A&P                           Medical Decision Making   Medical Screen Complete  This patient presented to the ED with complaint of transient mental status change, generalized weakness, subjective fever.  This complaint involves an extensive number of treatment options. The initial differential diagnosis includes, but is not limited to, infection, metabolic abnormality, CNS event  This presentation is: Acute, Self-Limited, Previously Undiagnosed, Uncertain Prognosis, Complicated, Systemic Symptoms, and Threat to Life/Bodily Function  Patient presented with apparent transient confusion that occurred  yesterday.  Patient's husband provides history that suggest that patient was febrile at the same time.  Patient with prior episode of UTI with suspected concurrent bacteremia.  Patient and patient husband are concerned about possible recurrent bacteremia or other significant infection.  Pending labs (UA) and disposition signed over to Dr. Almyra Free.   Additional history obtained:  Additional history obtained from Spouse External records from outside sources obtained and reviewed including prior ED visits and prior Inpatient records.    Lab Tests:  I ordered and personally interpreted labs.  The pertinent results include: COVID and flu testing is negative.  White count is 11.   Imaging Studies ordered:  I ordered imaging studies including chest x-ray I independently visualized and interpreted obtained imaging which showed no acute disease I agree with the radiologist interpretation.   Cardiac Monitoring:  The patient was maintained on a cardiac monitor.  I personally viewed and interpreted the cardiac monitor which showed an underlying rhythm of: Sinus rhythm     Problem List / ED Course:  Transient mental status change, subjective fever, suspected UTI   Reevaluation:  After the interventions noted above, I reevaluated the patient and found that they have: stayed the same              Final Clinical Impression(s) / ED Diagnoses Final diagnoses:  Urinary tract infection without hematuria, site unspecified    Rx / DC Orders ED Discharge Orders     None         Valarie Merino, MD 08/07/21 1449

## 2021-08-06 NOTE — ED Triage Notes (Signed)
PT IS BLIND  Husband states pt has been having chills, weakness and some slurred speech. Symptoms have been going on greater then 24 hours. Pt without deficits in triage

## 2021-08-07 ENCOUNTER — Other Ambulatory Visit: Payer: Self-pay

## 2021-08-07 DIAGNOSIS — Z823 Family history of stroke: Secondary | ICD-10-CM | POA: Diagnosis not present

## 2021-08-07 DIAGNOSIS — N390 Urinary tract infection, site not specified: Secondary | ICD-10-CM | POA: Diagnosis present

## 2021-08-07 DIAGNOSIS — I10 Essential (primary) hypertension: Secondary | ICD-10-CM | POA: Diagnosis not present

## 2021-08-07 DIAGNOSIS — Z833 Family history of diabetes mellitus: Secondary | ICD-10-CM | POA: Diagnosis not present

## 2021-08-07 DIAGNOSIS — G9341 Metabolic encephalopathy: Secondary | ICD-10-CM

## 2021-08-07 DIAGNOSIS — H3530 Unspecified macular degeneration: Secondary | ICD-10-CM | POA: Diagnosis present

## 2021-08-07 DIAGNOSIS — B9620 Unspecified Escherichia coli [E. coli] as the cause of diseases classified elsewhere: Secondary | ICD-10-CM | POA: Diagnosis present

## 2021-08-07 DIAGNOSIS — Z20822 Contact with and (suspected) exposure to covid-19: Secondary | ICD-10-CM | POA: Diagnosis present

## 2021-08-07 DIAGNOSIS — I129 Hypertensive chronic kidney disease with stage 1 through stage 4 chronic kidney disease, or unspecified chronic kidney disease: Secondary | ICD-10-CM | POA: Diagnosis present

## 2021-08-07 DIAGNOSIS — G3184 Mild cognitive impairment, so stated: Secondary | ICD-10-CM | POA: Diagnosis present

## 2021-08-07 DIAGNOSIS — Z79899 Other long term (current) drug therapy: Secondary | ICD-10-CM | POA: Diagnosis not present

## 2021-08-07 DIAGNOSIS — Z87891 Personal history of nicotine dependence: Secondary | ICD-10-CM | POA: Diagnosis not present

## 2021-08-07 DIAGNOSIS — R410 Disorientation, unspecified: Secondary | ICD-10-CM | POA: Diagnosis not present

## 2021-08-07 DIAGNOSIS — N1831 Chronic kidney disease, stage 3a: Secondary | ICD-10-CM | POA: Diagnosis present

## 2021-08-07 DIAGNOSIS — H548 Legal blindness, as defined in USA: Secondary | ICD-10-CM | POA: Diagnosis present

## 2021-08-07 DIAGNOSIS — M810 Age-related osteoporosis without current pathological fracture: Secondary | ICD-10-CM | POA: Diagnosis present

## 2021-08-07 DIAGNOSIS — Z8249 Family history of ischemic heart disease and other diseases of the circulatory system: Secondary | ICD-10-CM | POA: Diagnosis not present

## 2021-08-07 DIAGNOSIS — Z7982 Long term (current) use of aspirin: Secondary | ICD-10-CM | POA: Diagnosis not present

## 2021-08-07 DIAGNOSIS — E869 Volume depletion, unspecified: Secondary | ICD-10-CM | POA: Diagnosis present

## 2021-08-07 DIAGNOSIS — N3000 Acute cystitis without hematuria: Secondary | ICD-10-CM | POA: Diagnosis not present

## 2021-08-07 DIAGNOSIS — K219 Gastro-esophageal reflux disease without esophagitis: Secondary | ICD-10-CM | POA: Diagnosis present

## 2021-08-07 LAB — CBC
HCT: 38.3 % (ref 36.0–46.0)
Hemoglobin: 13 g/dL (ref 12.0–15.0)
MCH: 31.1 pg (ref 26.0–34.0)
MCHC: 33.9 g/dL (ref 30.0–36.0)
MCV: 91.6 fL (ref 80.0–100.0)
Platelets: 159 10*3/uL (ref 150–400)
RBC: 4.18 MIL/uL (ref 3.87–5.11)
RDW: 12.4 % (ref 11.5–15.5)
WBC: 8.1 10*3/uL (ref 4.0–10.5)
nRBC: 0 % (ref 0.0–0.2)

## 2021-08-07 LAB — COMPREHENSIVE METABOLIC PANEL
ALT: 35 U/L (ref 0–44)
AST: 37 U/L (ref 15–41)
Albumin: 3.5 g/dL (ref 3.5–5.0)
Alkaline Phosphatase: 90 U/L (ref 38–126)
Anion gap: 10 (ref 5–15)
BUN: 26 mg/dL — ABNORMAL HIGH (ref 8–23)
CO2: 25 mmol/L (ref 22–32)
Calcium: 9.3 mg/dL (ref 8.9–10.3)
Chloride: 105 mmol/L (ref 98–111)
Creatinine, Ser: 1.19 mg/dL — ABNORMAL HIGH (ref 0.44–1.00)
GFR, Estimated: 46 mL/min — ABNORMAL LOW (ref >60)
Glucose, Bld: 133 mg/dL — ABNORMAL HIGH (ref 70–99)
Potassium: 4 mmol/L (ref 3.5–5.1)
Sodium: 140 mmol/L (ref 135–145)
Total Bilirubin: 1.1 mg/dL (ref 0.3–1.2)
Total Protein: 6.5 g/dL (ref 6.5–8.1)

## 2021-08-07 LAB — TSH: TSH: 1.325 u[IU]/mL (ref 0.350–4.500)

## 2021-08-07 NOTE — Progress Notes (Signed)
PROGRESS NOTE  Michelle [name]:[id number] DOB: Sep 16, 1940 DOA: 08/06/2021 PCP: Haydee Salter, MD   LOS: 0 days   Brief narrative:  Michelle [name] is a 81 y.o. female past medical history of hypertension, mild cognitive impairment, legal blindness, presented to the hospital with weakness chills and rigor with altered mental status and confusion from home.  Patient's husband reported that that she did have episode of chills weakness and slurring of the speech with fever yesterday at home. In the ED, patient was afebrile with normal vitals.  Urinalysis was positive for hemoglobin protein nitrite leukocyte and white cells were more than 50.  Lactate was 1.3.  COVID and influenza was negative. WBC elevated at 11.0.  Due to confusion and subjective chills and rigor and history of bacteremia, patient was considered for admission to the hospital for further evaluation and treatment.    Assessment/Plan:  Principal Problem:   UTI (urinary tract infection) Active Problems:   Essential hypertension   Mild cognitive impairment with memory loss   Stage 3 chronic kidney disease (HCC)   Gastroesophageal reflux disease   Confusion   Confusion likely metabolic encephalopathy from UTI.    Influenza and COVID was negative.  Continue IV Rocephin.  Patient states that  her mom is still living and appears to be mildly confused.  TSH with 1.3.  No leukocytosis but had a fever of 102.5 F yesterday at 8 PM.   UTI.  History of bacteremia. On empiric IV Rocephin.  Blood cultures negative in less than 24 hours.  Urine culture pending.  stage III chronic kidney disease.  We [name] continue to monitor closely.  Latest creatinine of 1.1.   Essential hypertension.  History of renal artery stenosis status post stent.  Continue clonidine and lisinopril from home.  Latest blood pressure 105/42.    Mild cognitive impairment with memory loss.  History of hallucinations.  [name] provide supportive care.  Treat  underlying infection.  Continue donepezil, has mild confusion.   GERD.  Continue PPI   Mild volume depletion.  [name] encourage oral hydration.  Received IV fluids overnight.  Disposition.  Likely home tomorrow if no fever/negative cultures.  DVT prophylaxis: enoxaparin (LOVENOX) injection 40 mg Start: 08/07/21 1000  Code Status: Full code  Family Communication: I spoke with the patient's spouse on the phone and updated him about the clinical condition of the patient.  He feels like as she has significantly improved compared to yesterday.  Status is: Observation  The patient [name] require care spanning > 2 midnights and should be moved to inpatient because: Fever, UTI, altered mental status, rule out bacteremia:   Consultants: None    Procedures: None  Anti-infectives: Rocephin IV  Anti-infectives (From admission, onward)    Start     Dose/Rate Route Frequency Ordered Stop   08/07/21 1700  cefTRIAXone (ROCEPHIN) 1 g in sodium chloride 0.9 % 100 mL IVPB        1 g 200 mL/hr over 30 Minutes Intravenous Every 24 hours 08/06/21 1813     08/06/21 1645  cefTRIAXone (ROCEPHIN) 1 g in sodium chloride 0.9 % 100 mL IVPB        1 g 200 mL/hr over 30 Minutes Intravenous  Once 08/06/21 1630 08/06/21 1722      Subjective: Today, patient was seen and examined at bedside.  Patient feels better.  Had fever overnight.  Patient denies any urinary urgency frequency or dysuria.  Denies any shortness of breath chest  pain palpitation.  States that she wants to go back to her mom and appears to be little confused.  Objective: Vitals:   08/07/21 0642 08/07/21 1008  BP: (!) 116/44 (!) 105/42  Pulse: (!) 59 62  Resp: 18 18  Temp: 100 F (37.8 C) 98.1 F (36.7 C)  SpO2: 95% 100%    Intake/Output Summary (Last 24 hours) at 08/07/2021 1320 Last data filed at 08/07/2021 1000 Gross per 24 hour  Intake 1633.83 ml  Output 400 ml  Net 1233.83 ml   Filed Weights   08/06/21 1128  Weight: 65.8  kg   Body mass index is 25.69 kg/m.   Physical Exam: GENERAL: Patient is alert awake and communicative, appears to be confused at times but oriented to place and person.. Not in obvious distress. HENT: No scleral pallor or icterus. Pupils equally reactive to light. Oral mucosa is moist NECK: is supple, no gross swelling noted. CHEST: Clear to auscultation. No crackles or wheezes.  Diminished breath sounds bilaterally. CVS: S1 and S2 heard, no murmur. Regular rate and rhythm.  ABDOMEN: Soft, non-tender, bowel sounds are present. EXTREMITIES: No edema. CNS: Cranial nerves are intact. No focal motor deficits.  Impaired short-term memory. SKIN: warm and dry without rashes.  Data Review: I have personally reviewed the following laboratory data and studies,  CBC: Recent Labs  Lab 08/06/21 1143 08/07/21 0348  WBC 11.0* 8.1  HGB 13.4 13.0  HCT 39.1 38.3  MCV 90.1 91.6  PLT 181 086   Basic Metabolic Panel: Recent Labs  Lab 08/06/21 1143 08/07/21 0348  NA 137 140  K 3.9 4.0  CL 106 105  CO2 23 25  GLUCOSE 104* 133*  BUN 23 26*  CREATININE 1.17* 1.19*  CALCIUM 9.4 9.3   Liver Function Tests: Recent Labs  Lab 08/07/21 0348  AST 37  ALT 35  ALKPHOS 90  BILITOT 1.1  PROT 6.5  ALBUMIN 3.5   No results for input(s): LIPASE, AMYLASE in the last 168 hours. No results for input(s): AMMONIA in the last 168 hours. Cardiac Enzymes: No results for input(s): CKTOTAL, CKMB, CKMBINDEX, TROPONINI in the last 168 hours. BNP (last 3 results) No results for input(s): BNP in the last 8760 hours.  ProBNP (last 3 results) No results for input(s): PROBNP in the last 8760 hours.  CBG: Recent Labs  Lab 08/06/21 1134  GLUCAP 93   Recent Results (from the past 240 hour(s))  Resp Panel by RT-PCR (Flu A&B, Covid) Nasopharyngeal Swab     Status: None   Collection Time: 08/06/21  1:43 PM   Specimen: Nasopharyngeal Swab; Nasopharyngeal(NP) swabs in vial transport medium  Result Value  Ref Range Status   SARS Coronavirus 2 by RT PCR NEGATIVE NEGATIVE Final    Comment: (NOTE) SARS-CoV-2 target nucleic acids are NOT DETECTED.  The SARS-CoV-2 RNA is generally detectable in upper respiratory specimens during the acute phase of infection. The lowest concentration of SARS-CoV-2 viral copies this assay can detect is 138 copies/mL. A negative result does not preclude SARS-Cov-2 infection and should not be used as the sole basis for treatment or other patient management decisions. A negative result may occur with  improper specimen collection/handling, submission of specimen other than nasopharyngeal swab, presence of viral mutation(s) within the areas targeted by this assay, and inadequate number of viral copies(<138 copies/mL). A negative result must be combined with clinical observations, patient history, and epidemiological information. The expected result is Negative.  Fact Sheet for Patients:  EntrepreneurPulse.com.au  Fact Sheet for Healthcare Providers:  IncredibleEmployment.be  This test is no t yet approved or cleared by the [place] FDA and  has been authorized for detection and/or diagnosis of SARS-CoV-2 by FDA under an Emergency Use Authorization (EUA). This EUA [name] remain  in effect (meaning this test can be used) for the duration of the COVID-19 declaration under Section 564(b)(1) of the Act, 21 U.S.C.section 360bbb-3(b)(1), unless the authorization is terminated  or revoked sooner.       Influenza A by PCR NEGATIVE NEGATIVE Final   Influenza B by PCR NEGATIVE NEGATIVE Final    Comment: (NOTE) The Xpert Xpress SARS-CoV-2/FLU/RSV plus assay is intended as an aid in the diagnosis of influenza from Nasopharyngeal swab specimens and should not be used as a sole basis for treatment. Nasal washings and aspirates are unacceptable for Xpert Xpress SARS-CoV-2/FLU/RSV testing.  Fact Sheet for  Patients: EntrepreneurPulse.com.au  Fact Sheet for Healthcare Providers: IncredibleEmployment.be  This test is not yet approved or cleared by the [place] FDA and has been authorized for detection and/or diagnosis of SARS-CoV-2 by FDA under an Emergency Use Authorization (EUA). This EUA [name] remain in effect (meaning this test can be used) for the duration of the COVID-19 declaration under Section 564(b)(1) of the Act, 21 U.S.C. section 360bbb-3(b)(1), unless the authorization is terminated or revoked.  Performed at [facility], [place] [address], [place], [place] [address]   Culture, blood (routine x 2)     Status: None (Preliminary result)   Collection Time: 08/06/21  2:15 PM   Specimen: BLOOD RIGHT FOREARM  Result Value Ref Range Status   Specimen Description   Final    BLOOD RIGHT FOREARM Performed at [facility], [place] [address] [place], [place] [address]    Special Requests   Final    BOTTLES DRAWN AEROBIC AND ANAEROBIC Blood Culture adequate volume Performed at [place] [address] [place], [place] [address]    Culture   Final    NO GROWTH < 24 HOURS Performed at [place] [address] [place], [place] [address]    Report Status PENDING  Incomplete  Culture, blood (routine x 2)     Status: None (Preliminary result)   Collection Time: 08/06/21  2:15 PM   Specimen: BLOOD LEFT FOREARM  Result Value Ref Range Status   Specimen Description   Final    BLOOD LEFT FOREARM Performed at [facility], [place] [address], [place], [place] [address]    Special Requests   Final    BOTTLES DRAWN AEROBIC AND ANAEROBIC Blood Culture results may not be optimal due to an inadequate volume of blood received in culture bottles Performed at [place] [address] [place], [place] [address]    Culture   Final    NO GROWTH < 24 HOURS Performed at [place] [address], [place], [place] [address]    Report Status PENDING  Incomplete     Studies: DG Chest 2 View  Result Date: 08/06/2021 CLINICAL DATA:  Husband states pt has been having chills, weakness and some slurred speech. Symptoms have been going on greater then 24 hours. Pt without deficits in triage - to eval for fever.fever EXAM: CHEST - 2 VIEW COMPARISON:  None. FINDINGS: Normal mediastinum and cardiac silhouette. Normal pulmonary vasculature. No evidence of effusion, infiltrate, or pneumothorax. No acute bony abnormality. IMPRESSION: No acute cardiopulmonary process. Electronically Signed  By: Suzy Bouchard M.D.   On: 08/06/2021 14:42   CT Head Wo Contrast  Result Date: 08/06/2021 CLINICAL DATA:  Mental status change. EXAM: CT HEAD WITHOUT CONTRAST TECHNIQUE: Contiguous axial images were obtained from the base of the skull through the vertex without intravenous contrast. RADIATION DOSE REDUCTION: This exam was performed according to the departmental dose-optimization program which includes automated exposure control, adjustment of the mA and/or kV according to patient size and/or use of iterative reconstruction technique. COMPARISON:  CT head dated Nov 30, 2020 FINDINGS: Brain: No evidence of acute infarction, hemorrhage, hydrocephalus. 1.5 cm calcified mass in the pineal region, which may represent meningioma as seen on prior examinations is unchanged. No evidence of hydrocephalus or edema. Vascular: No hyperdense vessel or unexpected calcification. Skull: Normal. Negative for fracture or focal lesion. Sinuses/Orbits: Bilateral cataract surgery. Mild mucosal thickening of the ethmoid air cells. Other: None. IMPRESSION: 1.  No acute intracranial abnormality. 2. Stable calcified mass in the pineal region, likely meningioma, unchanged. Electronically Signed   By: Keane Police D.O.   On: 08/06/2021 14:38      Flora Lipps, MD  [facility] 08/07/2021  If 7PM-7AM,  please contact night-coverage

## 2021-08-08 DIAGNOSIS — I10 Essential (primary) hypertension: Secondary | ICD-10-CM | POA: Diagnosis not present

## 2021-08-08 DIAGNOSIS — K219 Gastro-esophageal reflux disease without esophagitis: Secondary | ICD-10-CM | POA: Diagnosis not present

## 2021-08-08 DIAGNOSIS — R410 Disorientation, unspecified: Secondary | ICD-10-CM | POA: Diagnosis not present

## 2021-08-08 DIAGNOSIS — N3000 Acute cystitis without hematuria: Secondary | ICD-10-CM | POA: Diagnosis not present

## 2021-08-08 LAB — CBC
HCT: 34.5 % — ABNORMAL LOW (ref 36.0–46.0)
Hemoglobin: 11.4 g/dL — ABNORMAL LOW (ref 12.0–15.0)
MCH: 30.6 pg (ref 26.0–34.0)
MCHC: 33 g/dL (ref 30.0–36.0)
MCV: 92.7 fL (ref 80.0–100.0)
Platelets: 150 10*3/uL (ref 150–400)
RBC: 3.72 MIL/uL — ABNORMAL LOW (ref 3.87–5.11)
RDW: 12.2 % (ref 11.5–15.5)
WBC: 5 10*3/uL (ref 4.0–10.5)
nRBC: 0 % (ref 0.0–0.2)

## 2021-08-08 LAB — BASIC METABOLIC PANEL
Anion gap: 5 (ref 5–15)
BUN: 21 mg/dL (ref 8–23)
CO2: 25 mmol/L (ref 22–32)
Calcium: 8.9 mg/dL (ref 8.9–10.3)
Chloride: 110 mmol/L (ref 98–111)
Creatinine, Ser: 1.02 mg/dL — ABNORMAL HIGH (ref 0.44–1.00)
GFR, Estimated: 55 mL/min — ABNORMAL LOW (ref >60)
Glucose, Bld: 122 mg/dL — ABNORMAL HIGH (ref 70–99)
Potassium: 3.5 mmol/L (ref 3.5–5.1)
Sodium: 140 mmol/L (ref 135–145)

## 2021-08-08 LAB — URINE CULTURE: Culture: >=100000 — AB

## 2021-08-08 MED ORDER — CLONIDINE HCL 0.1 MG PO TABS: 0.1000 mg | ORAL_TABLET | Freq: Every day | ORAL | Status: DC

## 2021-08-08 MED ORDER — CEPHALEXIN 500 MG PO CAPS: 500.0000 mg | ORAL_CAPSULE | Freq: Two times a day (BID) | ORAL | 0 refills | Status: AC

## 2021-08-08 MED ORDER — CEFDINIR 300 MG PO CAPS: 300.0000 mg | ORAL_CAPSULE | Freq: Two times a day (BID) | ORAL | 0 refills | Status: DC

## 2021-08-08 NOTE — Evaluation (Signed)
Physical Therapy Evaluation Patient Details Name: Michelle [name] MRN: [id number] DOB: 29-May-1941 Today's Date: 08/08/2021  History of Present Illness  81 y.o. female past medical history of hypertension, mild cognitive impairment, legal blindness, presented to hospital with weakness chills and rigor with altered mental status and confusion from home. Dx of UTI, metabolic encephalopathy.  Clinical Impression  Pt is mobilizing well. She ambulated 280' with RW, no loss of balance. She is ready to DC home from a PT standpoint. Her husband is able to provide needed assistance due to pt's visual deficits. Rollator recommended for home. No further acute PT indicated, will sign off.        Recommendations for follow up therapy are one component of a multi-disciplinary discharge planning process, led by the attending physician.  Recommendations may be updated based on patient status, additional functional criteria and insurance authorization.  Follow Up Recommendations No PT follow up    Assistance Recommended at Discharge Set up Supervision/Assistance (due to visual deficits)  Patient can return home with the following  Assist for transportation;Direct supervision/assist for medications management (due to visual deficits)    Equipment Recommendations Rollator (4 wheels)  Recommendations for Other Services       Functional Status Assessment Patient has not had a recent decline in their functional status     Precautions / Restrictions Precautions Precautions: Other (comment) Precaution Comments: legally blind, can make out objects but they are blurry; denies falls in past 6 months Restrictions Weight Bearing Restrictions: No      Mobility  Bed Mobility Overal bed mobility: Independent                  Transfers Overall transfer level: Independent                      Ambulation/Gait Ambulation/Gait assistance: Modified independent (Device/Increase time) Gait  Distance (Feet): 280 Feet Assistive device: Rolling walker (2 wheels) Gait Pattern/deviations: WFL(Within Functional Limits) Gait velocity: WFL     General Gait Details: steady with RW, no loss of balance  Stairs            Wheelchair Mobility    Modified Rankin (Stroke Patients Only)       Balance Overall balance assessment: Modified Independent                                           Pertinent Vitals/Pain Pain Assessment: No/denies pain    Home Living Family/patient expects to be discharged to:: Private residence Living Arrangements: Spouse/significant other Available Help at Discharge: Family;Available 24 hours/day Type of Home: House         Home Layout: Two level;Able to live on main level with bedroom/bathroom Home Equipment: Cane - single point      Prior Function Prior Level of Function : Independent/Modified Independent             Mobility Comments: uses SPC, denies falls in past 6 months ADLs Comments: independent, stands to shower     Hand Dominance        Extremity/Trunk Assessment   Upper Extremity Assessment Upper Extremity Assessment: Overall WFL for tasks assessed    Lower Extremity Assessment Lower Extremity Assessment: Overall WFL for tasks assessed    Cervical / Trunk Assessment Cervical / Trunk Assessment: Normal  Communication   Communication: No difficulties  Cognition Arousal/Alertness: Awake/alert Behavior  During Therapy: WFL for tasks assessed/performed Overall Cognitive Status: Within Functional Limits for tasks assessed                                          General Comments      Exercises     Assessment/Plan    PT Assessment Patient does not need any further PT services  PT Problem List         PT Treatment Interventions      PT Goals (Current goals can be found in the Care Plan section)  Acute Rehab PT Goals PT Goal Formulation: All assessment and  education complete, DC therapy    Frequency       Co-evaluation               AM-PAC PT "6 Clicks" Mobility  Outcome Measure Help needed turning from your back to your side while in a flat bed without using bedrails?: None Help needed moving from lying on your back to sitting on the side of a flat bed without using bedrails?: None Help needed moving to and from a bed to a chair (including a wheelchair)?: None Help needed standing up from a chair using your arms (e.g., wheelchair or bedside chair)?: None Help needed to walk in hospital room?: None Help needed climbing 3-5 steps with a railing? : None 6 Click Score: 24    End of Session Equipment Utilized During Treatment: Gait belt Activity Tolerance: Patient tolerated treatment well Patient left: in chair;with call bell/phone within reach;with family/visitor present Nurse Communication: Mobility status      Time: 1694-5038 PT Time Calculation (min) (ACUTE ONLY): 17 min   Charges:   PT Evaluation $PT Eval Low Complexity: 1 Low         Philomena Doheny PT 08/08/2021  Acute Rehabilitation Services Pager [phone] Office [phone]

## 2021-08-08 NOTE — Progress Notes (Signed)
Discharge instructions given. Patient verbalized understanding and all questions were answered.  ?

## 2021-08-08 NOTE — TOC Initial Note (Signed)
Transition of Care [facility]) - Initial/Assessment Note    Patient Details  Name: Michelle [name] MRN: [id number] Date of Birth: 1941-02-26  Transition of Care [facility]) CM/SW Contact:    Leeroy Cha, RN Phone Number: 08/08/2021, 8:17 AM  Clinical Narrative:                  Transition of Care [facility]) Screening Note   Patient Details  Name: Michelle [name] Date of Birth: 06/18/1941   Transition of Care [facility]) CM/SW Contact:    Leeroy Cha, RN Phone Number: 08/08/2021, 8:17 AM    Transition of Care Department [facility]) has reviewed patient and no TOC needs have been identified at this time. We will continue to monitor patient advancement through interdisciplinary progression rounds. If new patient transition needs arise, please place a TOC consult.    Expected Discharge Plan: Home/Self Care Barriers to Discharge: Continued Medical Work up   Patient Goals and CMS Choice Patient states their goals for this hospitalization and ongoing recovery are:: to go home CMS Medicare.gov Compare Post Acute Care list provided to:: Patient    Expected Discharge Plan and Services Expected Discharge Plan: Home/Self Care   Discharge Planning Services: CM Consult   Living arrangements for the past 2 months: Single Family Home                                      Prior Living Arrangements/Services Living arrangements for the past 2 months: Single Family Home Lives with:: Spouse Patient language and need for interpreter reviewed:: Yes Do you feel safe going back to the place where you live?: Yes               Activities of Daily Living Home Assistive Devices/Equipment: Cane (specify quad or straight) ADL Screening (condition at time of admission) Patient's cognitive ability adequate to safely complete daily activities?: No Is the patient deaf or have difficulty hearing?: No Does the patient have difficulty seeing, even when wearing glasses/contacts?: Yes Does the  patient have difficulty concentrating, remembering, or making decisions?: Yes Patient able to express need for assistance with ADLs?: Yes Does the patient have difficulty dressing or bathing?: No Independently performs ADLs?: No Communication: Independent Dressing (OT): Independent Grooming: Independent Feeding: Independent Bathing: Independent Toileting: Independent In/Out Bed: Independent Walks in Home: Independent Does the patient have difficulty walking or climbing stairs?: Yes Weakness of Legs: Both Weakness of Arms/Hands: None  Permission Sought/Granted                  Emotional Assessment Appearance:: Appears stated age Attitude/Demeanor/Rapport: Engaged Affect (typically observed): Calm Orientation: : Oriented to Place, Oriented to Self, Oriented to  Time, Oriented to Situation Alcohol / Substance Use: Not Applicable Psych Involvement: No (comment)  Admission diagnosis:  UTI (urinary tract infection) [N39.0] Patient Active Problem List   Diagnosis Date Noted   Confusion 08/06/2021   UTI (urinary tract infection) 08/06/2021   Normocytic anemia 05/12/2021   Stage 3 chronic kidney disease (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   Macular degeneration of both eyes 08/11/2018   Essential hypertension 07/03/2014   History of renal artery stenosis 06/14/2014   Squamous cell carcinoma in situ of skin 02/08/2013  Dysplastic nevus of trunk 01/23/2013   Contact dermatitis and eczema 10/31/2012   Osteopenia 09/29/2007   Allergic rhinitis 02/24/2007   Generalized OA 02/24/2007   PCP:  Haydee Salter, MD Pharmacy:   [id number] - [place], [place] [address] Phone: [phone] Fax:  [phone]     Social Determinants of Health (SDOH) Interventions    Readmission Risk Interventions No flowsheet data found.

## 2021-08-08 NOTE — Discharge Summary (Signed)
Physician Discharge Summary  Michelle [name] IOE:[id number] DOB: 19-Mar-1941 DOA: 08/06/2021  PCP: Haydee Salter, MD  Admit date: 08/06/2021 Discharge date: 08/08/2021  Admitted From: Home  Discharge disposition: Home   Recommendations for Outpatient Follow-Up:   Follow up with your primary care provider in one week.  Check CBC, BMP, magnesium in the next visit  Discharge Diagnosis:   Principal Problem:   UTI (urinary tract infection) Active Problems:   Essential hypertension   Mild cognitive impairment with memory loss   Stage 3 chronic kidney disease (HCC)   Gastroesophageal reflux disease   Confusion  Discharge Condition: Improved.  Diet recommendation: Low sodium, heart healthy.    Wound care: None.  Code status: Full.   History of Present Illness:   Michelle [name] is a 81 y.o. female past medical history of hypertension, mild cognitive impairment, legal blindness, presented to the hospital with weakness chills and rigor with altered mental status and confusion from home.  Patient's husband reported that that she did have episode of chills weakness and slurring of the speech with fever at home. In the ED, patient was afebrile with normal vitals.  Urinalysis was positive for hemoglobin protein nitrite leukocyte and white cells were more than 50.  Lactate was 1.3.  COVID and influenza was negative. WBC elevated at 11.0.  Due to confusion and subjective chills and rigor and history of bacteremia, patient was considered for admission to the hospital for further evaluation and treatment.  Hospital Course:   Following conditions were addressed during hospitalization as listed below,  Confusion likely metabolic encephalopathy from UTI.    Influenza and COVID was negative. TSH with 1.3.  No leukocytosis but had a fever of 102.5 F during hospitalization.  Patient responded well with IV Rocephin.  This will be changed to oral Keflex on discharge.   E. coli UTI.     Urine culture noted sensitive to Keflex.  We will continue Keflex on discharge for 3 more days to complete the course.  stage IIIa chronic kidney disease.  Latest creatinine of 1.1.  Currently stable.   Essential hypertension.  History of renal artery stenosis status post stent.  Continue clonidine and lisinopril from home.      Mild cognitive impairment with memory loss.  History of hallucinations.  Patient is fully oriented today.  Continue donepezil,    GERD.  Continue PPI from home.   Mild volume depletion.  Will encourage oral hydration.  Received IV fluids during hospitalization   Disposition.  At this time, patient is stable for disposition home with outpatient PCP follow-up.  Spoke with the patient's husband at bedside.  Medical Consultants:   None.  Procedures:    None Subjective:   Today, patient was seen and examined at bedside.  Denies any urinary urgency, frequency or dysuria.  Fully alert awake and oriented.  Denies any pain fever chills.  Wishes to go home  Discharge Exam:   Vitals:   08/07/21 2106 08/08/21 0542  BP: 138/65 (!) 122/49  Pulse: (!) 49 (!) 46  Resp: 16 18  Temp: 98.6 F (37 C) 98.5 F (36.9 C)  SpO2: 99% 95%   Vitals:   08/07/21 1352 08/07/21 2106 08/08/21 0542 08/08/21 0628  BP: (!) 159/54 138/65 (!) 122/49   Pulse: (!) 51 (!) 49 (!) 46   Resp: 18 16 18    Temp: 98.2 F (36.8 C) 98.6 F (37 C) 98.5 F (36.9 C)   TempSrc: Oral Oral  Oral   SpO2: 100% 99% 95%   Weight:    68 kg  Height:       General: Alert awake, not in obvious distress HENT: pupils equally reacting to light,  No scleral pallor or icterus noted. Oral mucosa is moist.  Chest:  Clear breath sounds.  Diminished breath sounds bilaterally. No crackles or wheezes.  CVS: S1 &S2 heard. No murmur.  Regular rate and rhythm. Abdomen: Soft, nontender, nondistended.  Bowel sounds are heard.   Extremities: No cyanosis, clubbing or edema.  Peripheral pulses are palpable. Psych:  Alert, awake and oriented, normal mood, impaired memory. CNS:  No cranial nerve deficits.  Power equal in all extremities.   Skin: Warm and dry.  No rashes noted.  The results of significant diagnostics from this hospitalization (including imaging, microbiology, ancillary and laboratory) are listed below for reference.     Diagnostic Studies:   DG Chest 2 View  Result Date: 08/06/2021 CLINICAL DATA:  Husband states pt has been having chills, weakness and some slurred speech. Symptoms have been going on greater then 24 hours. Pt without deficits in triage - to eval for fever.fever EXAM: CHEST - 2 VIEW COMPARISON:  None. FINDINGS: Normal mediastinum and cardiac silhouette. Normal pulmonary vasculature. No evidence of effusion, infiltrate, or pneumothorax. No acute bony abnormality. IMPRESSION: No acute cardiopulmonary process. Electronically Signed   By: Suzy Bouchard M.D.   On: 08/06/2021 14:42   CT Head Wo Contrast  Result Date: 08/06/2021 CLINICAL DATA:  Mental status change. EXAM: CT HEAD WITHOUT CONTRAST TECHNIQUE: Contiguous axial images were obtained from the base of the skull through the vertex without intravenous contrast. RADIATION DOSE REDUCTION: This exam was performed according to the departmental dose-optimization program which includes automated exposure control, adjustment of the mA and/or kV according to patient size and/or use of iterative reconstruction technique. COMPARISON:  CT head dated Nov 30, 2020 FINDINGS: Brain: No evidence of acute infarction, hemorrhage, hydrocephalus. 1.5 cm calcified mass in the pineal region, which may represent meningioma as seen on prior examinations is unchanged. No evidence of hydrocephalus or edema. Vascular: No hyperdense vessel or unexpected calcification. Skull: Normal. Negative for fracture or focal lesion. Sinuses/Orbits: Bilateral cataract surgery. Mild mucosal thickening of the ethmoid air cells. Other: None. IMPRESSION: 1.  No acute  intracranial abnormality. 2. Stable calcified mass in the pineal region, likely meningioma, unchanged. Electronically Signed   By: Keane Police D.O.   On: 08/06/2021 14:38     Labs:   Basic Metabolic Panel: Recent Labs  Lab 08/06/21 1143 08/07/21 0348 08/08/21 0349  NA 137 140 140  K 3.9 4.0 3.5  CL 106 105 110  CO2 23 25 25   GLUCOSE 104* 133* 122*  BUN 23 26* 21  CREATININE 1.17* 1.19* 1.02*  CALCIUM 9.4 9.3 8.9   GFR Estimated Creatinine Clearance: 39.3 mL/min (A) (by C-G formula based on SCr of 1.02 mg/dL (H)). Liver Function Tests: Recent Labs  Lab 08/07/21 0348  AST 37  ALT 35  ALKPHOS 90  BILITOT 1.1  PROT 6.5  ALBUMIN 3.5   No results for input(s): LIPASE, AMYLASE in the last 168 hours. No results for input(s): AMMONIA in the last 168 hours. Coagulation profile No results for input(s): INR, PROTIME in the last 168 hours.  CBC: Recent Labs  Lab 08/06/21 1143 08/07/21 0348 08/08/21 0349  WBC 11.0* 8.1 5.0  HGB 13.4 13.0 11.4*  HCT 39.1 38.3 34.5*  MCV 90.1 91.6 92.7  PLT 181 159  150   Cardiac Enzymes: No results for input(s): CKTOTAL, CKMB, CKMBINDEX, TROPONINI in the last 168 hours. BNP: Invalid input(s): POCBNP CBG: Recent Labs  Lab 08/06/21 1134  GLUCAP 93   D-Dimer No results for input(s): DDIMER in the last 72 hours. Hgb A1c No results for input(s): HGBA1C in the last 72 hours. Lipid Profile No results for input(s): CHOL, HDL, LDLCALC, TRIG, CHOLHDL, LDLDIRECT in the last 72 hours. Thyroid function studies Recent Labs    08/07/21 0348  TSH 1.325   Anemia work up No results for input(s): VITAMINB12, FOLATE, FERRITIN, TIBC, IRON, RETICCTPCT in the last 72 hours. Microbiology Recent Results (from the past 240 hour(s))  Resp Panel by RT-PCR (Flu A&B, Covid) Nasopharyngeal Swab     Status: None   Collection Time: 08/06/21  1:43 PM   Specimen: Nasopharyngeal Swab; Nasopharyngeal(NP) swabs in vial transport medium  Result Value Ref  Range Status   SARS Coronavirus 2 by RT PCR NEGATIVE NEGATIVE Final    Comment: (NOTE) SARS-CoV-2 target nucleic acids are NOT DETECTED.  The SARS-CoV-2 RNA is generally detectable in upper respiratory specimens during the acute phase of infection. The lowest concentration of SARS-CoV-2 viral copies this assay can detect is 138 copies/mL. A negative result does not preclude SARS-Cov-2 infection and should not be used as the sole basis for treatment or other patient management decisions. A negative result may occur with  improper specimen collection/handling, submission of specimen other than nasopharyngeal swab, presence of viral mutation(s) within the areas targeted by this assay, and inadequate number of viral copies(<138 copies/mL). A negative result must be combined with clinical observations, patient history, and epidemiological information. The expected result is Negative.  Fact Sheet for Patients:  EntrepreneurPulse.com.au  Fact Sheet for Healthcare Providers:  IncredibleEmployment.be  This test is no t yet approved or cleared by the [place] FDA and  has been authorized for detection and/or diagnosis of SARS-CoV-2 by FDA under an Emergency Use Authorization (EUA). This EUA will remain  in effect (meaning this test can be used) for the duration of the COVID-19 declaration under Section 564(b)(1) of the Act, 21 U.S.C.section 360bbb-3(b)(1), unless the authorization is terminated  or revoked sooner.       Influenza A by PCR NEGATIVE NEGATIVE Final   Influenza B by PCR NEGATIVE NEGATIVE Final    Comment: (NOTE) The Xpert Xpress SARS-CoV-2/FLU/RSV plus assay is intended as an aid in the diagnosis of influenza from Nasopharyngeal swab specimens and should not be used as a sole basis for treatment. Nasal washings and aspirates are unacceptable for Xpert Xpress SARS-CoV-2/FLU/RSV testing.  Fact Sheet for  Patients: EntrepreneurPulse.com.au  Fact Sheet for Healthcare Providers: IncredibleEmployment.be  This test is not yet approved or cleared by the [place] FDA and has been authorized for detection and/or diagnosis of SARS-CoV-2 by FDA under an Emergency Use Authorization (EUA). This EUA will remain in effect (meaning this test can be used) for the duration of the COVID-19 declaration under Section 564(b)(1) of the Act, 21 U.S.C. section 360bbb-3(b)(1), unless the authorization is terminated or revoked.  Performed at [facility], [place] [address] [place], [place] [address]   Culture, blood (routine x 2)     Status: None (Preliminary result)   Collection Time: 08/06/21  2:15 PM   Specimen: BLOOD RIGHT FOREARM  Result Value Ref Range Status   Specimen Description   Final    BLOOD RIGHT FOREARM Performed at [facility], [place],  [place] [address]    Special Requests   Final    BOTTLES DRAWN AEROBIC AND ANAEROBIC Blood Culture adequate volume Performed at [facility] [address] [place], [place] [address]    Culture   Final    NO GROWTH 2 DAYS Performed at [facility] [address], [place], [place] [address]    Report Status PENDING  Incomplete  Culture, blood (routine x 2)     Status: None (Preliminary result)   Collection Time: 08/06/21  2:15 PM   Specimen: BLOOD LEFT FOREARM  Result Value Ref Range Status   Specimen Description   Final    BLOOD LEFT FOREARM Performed at [facility], [place] [address], [place], [place] [address]    Special Requests   Final    BOTTLES DRAWN AEROBIC AND ANAEROBIC Blood Culture results may not be optimal due to an inadequate volume of blood received in culture bottles Performed at [facility] [address], [place], [place] [address]    Culture   Final    NO GROWTH 2 DAYS Performed at [facility] [address], [place], [place] [address]    Report Status PENDING  Incomplete  Urine Culture     Status: Abnormal   Collection Time: 08/06/21  6:35 PM   Specimen: Urine, Clean Catch  Result Value Ref Range Status   Specimen Description   Final    URINE, CLEAN CATCH Performed at [facility], [place] [address], [place], [place] [address]    Special Requests   Final    NONE Performed at [facility], [place] [address], [place], [place] [address]    Culture >=100,000 COLONIES/mL ESCHERICHIA COLI (A)  Final   Report Status 08/08/2021 FINAL  Final   Organism ID, Bacteria ESCHERICHIA COLI (A)  Final      Susceptibility   Escherichia coli - MIC*    AMPICILLIN >=32 RESISTANT Resistant     CEFAZOLIN <=4 SENSITIVE Sensitive     CEFEPIME <=0.12 SENSITIVE Sensitive     CEFTRIAXONE <=0.25 SENSITIVE Sensitive     CIPROFLOXACIN <=0.25 SENSITIVE Sensitive     GENTAMICIN <=1 SENSITIVE Sensitive     IMIPENEM <=0.25 SENSITIVE Sensitive     NITROFURANTOIN <=16 SENSITIVE Sensitive     TRIMETH/SULFA <=20 SENSITIVE Sensitive     AMPICILLIN/SULBACTAM >=32 RESISTANT Resistant     PIP/TAZO <=4 SENSITIVE Sensitive     * >=100,000 COLONIES/mL ESCHERICHIA COLI     Discharge Instructions:   Discharge Instructions     Call MD for:  persistant nausea and vomiting   Complete by: As directed    Call MD for:  temperature >100.4   Complete by: As directed    Diet - low sodium heart healthy   Complete by: As directed    Discharge instructions   Complete by: As directed    Increase fluid intake.  Complete the course of antibiotic.  Follow-up with your primary care physician in 1 week.   Increase activity slowly   Complete by: As directed       Allergies as of 08/08/2021       Reactions   Iodinated Contrast Media Hives   Patient believes this was an older form of iodinated contrast.  Tolerated OMNIPAQUE IOHEXOL 11/19/14 for renal arteriogram/angioplasty  without prep. [name], RN   [facility] Other (See Comments)   unknown   Diphenhydramine Hcl Hives  Medication List     STOP taking these medications    ibuprofen 200 MG tablet Commonly known as: ADVIL       TAKE these medications    Bevacizumab 100 MG/4ML Soln Commonly known as: AVASTIN Inject 1-10 mg into the eye See admin instructions. For eyes One every 6 weeks 1/10 mg   calcium-vitamin D 500-200 MG-UNIT tablet Commonly known as: OSCAL WITH D Take 1 tablet by mouth daily with breakfast.   cephALEXin 500 MG capsule Commonly known as: KEFLEX Take 1 capsule (500 mg total) by mouth 2 (two) times daily for 3 days.   cetirizine 10 MG tablet Commonly known as: ZYRTEC Take 10 mg by mouth daily as needed for allergies.   cloNIDine 0.1 MG tablet Commonly known as: CATAPRES Take 1 tablet (0.1 mg total) by mouth daily.   donepezil 5 MG tablet Commonly known as: ARICEPT Take 1 tablet (5 mg total) by mouth at bedtime. What changed: when to take this   lisinopril 20 MG tablet Commonly known as: ZESTRIL TAKE 1 TABLET BY MOUTH TWICE A DAY What changed: when to take this   omeprazole 20 MG capsule Commonly known as: PRILOSEC Take 20 mg by mouth daily before breakfast.               Durable Medical Equipment  (From admission, onward)           Start     Ordered   08/08/21 1104  For home use only DME 4 wheeled rolling walker with seat  Once       Question:  Patient needs a walker to treat with the following condition  Answer:  Gait instability   08/08/21 1103            Follow-up Information     Haydee Salter, MD Follow up in 1 week(s).   Specialty: Family Medicine Contact information: [place] [address] [phone]                 Time coordinating discharge: 39 minutes  Signed:  Baeleigh Devincent  [facility] 08/08/2021, 12:06 PM

## 2021-08-08 NOTE — TOC Transition Note (Signed)
Transition of Care [facility]) - CM/SW Discharge Note   Patient Details  Name: Michelle [name] MRN: [id number] Date of Birth: Jun 15, 1941  [facility]) CM/SW Contact:  Leeroy Cha, RN Phone Number: 08/08/2021, 11:06 AM   Clinical Narrative:    Dcd to home weith rolling walker with seat.  No other toc present    Final next level of care: Home/Self Care Barriers to Discharge: Barriers Resolved   Patient Goals and CMS Choice Patient states their goals for this hospitalization and ongoing recovery are:: to go home CMS Medicare.gov Compare Post Acute Care list provided to:: Patient Choice offered to / list presented to : Patient  Discharge Placement                       Discharge Plan and Services   Discharge Planning Services: CM Consult Post Acute Care Choice: Durable Medical Equipment          DME Arranged: Walker rolling with seat DME Agency: [employer] Date DME Agency Contacted: 08/08/21 Time DME Agency Contacted: 48 Representative spoke with at DME Agency: [email or web address]            Social Determinants of Health ([place]) Interventions     Readmission Risk Interventions No flowsheet data found.

## 2021-08-11 LAB — CULTURE, BLOOD (ROUTINE X 2)
Culture: NO GROWTH
Culture: NO GROWTH
Special Requests: ADEQUATE

## 2021-08-12 ENCOUNTER — Other Ambulatory Visit: Payer: Self-pay

## 2021-08-12 ENCOUNTER — Encounter: Payer: Self-pay | Admitting: Family Medicine

## 2021-08-13 ENCOUNTER — Ambulatory Visit (INDEPENDENT_AMBULATORY_CARE_PROVIDER_SITE_OTHER): Payer: Medicare HMO | Admitting: Family Medicine

## 2021-08-13 VITALS — BP 118/60 | HR 50 | Temp 97.4°F | Ht 63.0 in | Wt 151.2 lb

## 2021-08-13 DIAGNOSIS — R410 Disorientation, unspecified: Secondary | ICD-10-CM | POA: Diagnosis not present

## 2021-08-13 DIAGNOSIS — I10 Essential (primary) hypertension: Secondary | ICD-10-CM | POA: Diagnosis not present

## 2021-08-13 DIAGNOSIS — D649 Anemia, unspecified: Secondary | ICD-10-CM | POA: Diagnosis not present

## 2021-08-13 DIAGNOSIS — Z87440 Personal history of urinary (tract) infections: Secondary | ICD-10-CM

## 2021-08-13 DIAGNOSIS — G3184 Mild cognitive impairment, so stated: Secondary | ICD-10-CM

## 2021-08-13 DIAGNOSIS — M85839 Other specified disorders of bone density and structure, unspecified forearm: Secondary | ICD-10-CM

## 2021-08-13 DIAGNOSIS — Q605 Renal hypoplasia, unspecified: Secondary | ICD-10-CM | POA: Insufficient documentation

## 2021-08-13 DIAGNOSIS — H3530 Unspecified macular degeneration: Secondary | ICD-10-CM

## 2021-08-13 DIAGNOSIS — R001 Bradycardia, unspecified: Secondary | ICD-10-CM | POA: Diagnosis not present

## 2021-08-13 DIAGNOSIS — N1831 Chronic kidney disease, stage 3a: Secondary | ICD-10-CM

## 2021-08-13 LAB — BASIC METABOLIC PANEL
BUN: 11 mg/dL (ref 6–23)
CO2: 28 mEq/L (ref 19–32)
Calcium: 9.3 mg/dL (ref 8.4–10.5)
Chloride: 108 mEq/L (ref 96–112)
Creatinine, Ser: 0.92 mg/dL (ref 0.40–1.20)
GFR: 57.93 mL/min — ABNORMAL LOW (ref >60.00)
Glucose, Bld: 103 mg/dL — ABNORMAL HIGH (ref 70–99)
Potassium: 3.6 mEq/L (ref 3.5–5.1)
Sodium: 142 mEq/L (ref 135–145)

## 2021-08-13 LAB — CBC
HCT: 36.1 % (ref 36.0–46.0)
Hemoglobin: 12.5 g/dL (ref 12.0–15.0)
MCHC: 34.6 g/dL (ref 30.0–36.0)
MCV: 88.6 fl (ref 78.0–100.0)
Platelets: 285 10*3/uL (ref 150.0–400.0)
RBC: 4.07 Mil/uL (ref 3.87–5.11)
RDW: 12.3 % (ref 11.5–15.5)
WBC: 6.3 10*3/uL (ref 4.0–10.5)

## 2021-08-13 LAB — MAGNESIUM: Magnesium: 1.9 mg/dL (ref 1.5–2.5)

## 2021-08-13 NOTE — Progress Notes (Signed)
[facility] [address] [place] [address] Dept: [phone] Dept Fax: [phone]  Office Visit  Subjective:    Patient ID: Michelle [name], female    DOB: 24-Mar-1941, 81 y.o..   MRN: [id number]  Chief Complaint  Patient presents with   Hospitalization Follow-up    F/u hospital.      History of Present Illness:  Patient is in today for follow-up from a recent hospitalization. Michelle [name] was admitted at [facility] from 6/28-3/15/1761 with a complicated UTI and acute confusion. She was found to have an E. coli infection and was discharge on cephalexin. Michelle [name] notes that she is feeling improved since discharge, though she has some mild ongoing fatigue. She is not experiencing any continued confusion. She was noted to have a heart rate in the 40s in the hospital.  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   Macular degeneration of both eyes 08/11/2018   Essential hypertension 07/03/2014   History of renal artery stenosis 06/14/2014   Squamous cell carcinoma in situ of skin 02/08/2013   Dysplastic nevus of trunk 01/23/2013   Contact dermatitis and eczema 10/31/2012   Osteopenia 09/29/2007   Allergic rhinitis 02/24/2007   Generalized OA 02/24/2007   Past Surgical History:  Procedure Laterality Date   ANGIOPLASTY     right renal 1981   BTL     cb x 3     HEMORRHOID SURGERY     IR PTA RENAL VISCERAL  05/11/2017   IR RADIOLOGIST EVAL & MGMT  04/21/2017   IR RENAL BILAT S&I MOD SED  05/11/2017   IR US GUIDE VASC ACCESS RIGHT  05/11/2017   Rt ovarian cyst  removed     TONSILLECTOMY AND ADENOIDECTOMY     Family History  Problem Relation Age of Onset   Diabetes Mother    Hypertension Mother    Stroke Brother    Diabetes Maternal Aunt    Outpatient Medications Prior to Visit  Medication Sig Dispense Refill   Bevacizumab (AVASTIN) 100 MG/4ML SOLN Inject 1-10 mg into the eye See admin instructions. For eyes One every 6 weeks 1/10 mg     calcium-vitamin D (OSCAL WITH D) 500-200 MG-UNIT tablet Take 1 tablet by mouth daily with breakfast. 90 tablet 3   cetirizine (ZYRTEC) 10 MG tablet Take 10 mg by mouth daily as needed for allergies.     cloNIDine (CATAPRES) 0.1 MG tablet Take 1 tablet (0.1 mg total) by mouth daily.     donepezil (ARICEPT) 5 MG tablet Take 1 tablet (5 mg total) by mouth at bedtime. (Patient taking differently: Take 5 mg by mouth daily.) 90 tablet 3   lisinopril (ZESTRIL) 20 MG tablet TAKE 1 TABLET BY MOUTH TWICE A DAY (Patient taking differently: Take 20 mg by mouth daily.) 60 tablet 2   omeprazole (PRILOSEC) 20 MG capsule Take 20 mg by mouth daily before breakfast.     cefdinir (OMNICEF) 300 MG capsule Take 300 mg by mouth 2 (two) times daily.     No facility-administered medications prior to visit.   Allergies  Allergen Reactions   Iodinated Contrast Media Hives  Patient believes this was an older form of iodinated contrast.  Tolerated OMNIPAQUE IOHEXOL 11/19/14 for renal arteriogram/angioplasty without prep. Michelle Lore, RN   [facility] Other (See Comments)    unknown   Diphenhydramine Hcl Hives      Objective:   Today's Vitals   08/13/21 1001  BP: 118/60  Pulse: (!) 50  Temp: (!) 97.4 F (36.3 C)  TempSrc: Temporal  SpO2: 99%  Weight: 151 lb 3.2 oz (68.6 kg)  Height: 5\' 3"  (1.6 m)   Body mass index is 26.78 kg/m.   General: Well developed, well nourished. No acute distress. Psych: Alert and oriented. Normal mood and affect.  Health Maintenance Due  Topic Date Due   Zoster Vaccines- Shingrix (2 of 2)  03/24/2021     Assessment & Plan:   1. History of UTI Recent complicated UTI due to E. coli. This appears resolved at this point. We discussed watching for signs/symptoms of recurrence.  2. Essential hypertension BP is at goal. Continue clonidine and lisinopril.  - Basic metabolic panel  3. Bradycardia Ms. Schoenberger has had mild bradycardia for some time, however it has been slower since her recent illness. This may be contributing to her sense of fatigue. I will refer her to cardiology for an assessment regarding whether she may need intervention (pacemaker).  - Ambulatory referral to Cardiology  4. Mild cognitive impairment with memory loss Recommend she resume her donepezil.  5. Confusion Improved with clearing of her infeciton. Magnesium level recommended at discharge.  - Magnesium  6. Osteopenia of forearm, unspecified laterality Resume calcium with Vit. D.  7. Chronic kidney disease, stage 3a ([place]) Reassessing renal function s/p recent infection.  - Basic metabolic panel - Magnesium  8. Macular degeneration of both eyes, unspecified type Stable. Continue to follow with ophthalmology.  9. Normocytic anemia Will follow-up on anemia.  - CBC  Haydee Salter, MD

## 2021-08-29 ENCOUNTER — Ambulatory Visit (INDEPENDENT_AMBULATORY_CARE_PROVIDER_SITE_OTHER): Payer: Medicare HMO | Admitting: Cardiovascular Disease

## 2021-08-29 ENCOUNTER — Other Ambulatory Visit: Payer: Self-pay

## 2021-08-29 ENCOUNTER — Ambulatory Visit: Payer: Medicare HMO | Admitting: Internal Medicine

## 2021-08-29 ENCOUNTER — Encounter: Payer: Self-pay | Admitting: Cardiovascular Disease

## 2021-08-29 VITALS — BP 138/88 | HR 54 | Ht 63.0 in | Wt 148.8 lb

## 2021-08-29 DIAGNOSIS — R001 Bradycardia, unspecified: Secondary | ICD-10-CM | POA: Diagnosis not present

## 2021-08-29 NOTE — Progress Notes (Signed)
Cardiology Office Note:   Date:  08/29/2021  NAME:  Michelle [name]    MRN: [id number] DOB:  1940-11-08   PCP:  Haydee Salter, MD  Cardiologist:  None  Electrophysiologist:  None   Referring MD: Haydee Salter, MD   Chief Complaint  Patient presents with   Bradycardia    History of Present Illness:   Michelle [name] is a 81 y.o. female with a hx of HTN, memory impairment, CKD3a, UTI who is being seen today for the evaluation of bradycardia at the request of Haydee Salter, MD. recent hospitalization for UTI.  Noted to have slow heart rate during that admission.  Rate was in the 40 to 50 bpm range.  Seen by primary care physician with persistently low heart rate.  She has been on clonidine for a while.  Apparently her husband looked up the clonidine causes bradycardia and appropriately stopped this.  Her heart rate is now in the 50s and has come up.  She reports no dizziness or lightheadedness.  She denies any shortness of breath.  She is not that active but really tells me she has no symptoms.  Recent thyroid studies were normal.  No history of heart disease.  She does have high blood pressure.  Well-controlled today.  She is a former smoker.  No alcohol or drug use is reported.  She is also on donepezil.  Both donepezil and clonidine are known to cause bradycardia.  She has had no passing out spells.  No dizziness or lightheadedness.  She needs to be relatively asymptomatic.  Cardiovascular examination is normal.  No strong family history of heart disease.  TSH 1.32  Problem List HTN Cognitive impairment  CKD 3a  Past Medical History: Past Medical History:  Diagnosis Date   Allergic rhinitis    Cystocele    Fibromuscular dysplasia of renal artery (HCC)    Hypertension    fibromuscular dysplasia   Osteoarthritis    Osteoporosis    PMS (premenstrual syndrome)    Retinal hemorrhage, right    with macular degeneration    Past Surgical History: Past Surgical History:   Procedure Laterality Date   ANGIOPLASTY     right renal 1981   BTL     cb x 3     HEMORRHOID SURGERY     IR PTA RENAL VISCERAL  05/11/2017   IR RADIOLOGIST EVAL & MGMT  04/21/2017   IR RENAL BILAT S&I MOD SED  05/11/2017   IR US GUIDE VASC ACCESS RIGHT  05/11/2017   Rt ovarian cyst removed     TONSILLECTOMY AND ADENOIDECTOMY      Current Medications: Current Meds  Medication Sig   Bevacizumab (AVASTIN) 100 MG/4ML SOLN Inject 1-10 mg into the eye See admin instructions. For eyes One every 6 weeks 1/10 mg   calcium-vitamin D (OSCAL WITH D) 500-200 MG-UNIT tablet Take 1 tablet by mouth daily with breakfast.   cetirizine (ZYRTEC) 10 MG tablet Take 10 mg by mouth daily as needed for allergies.   donepezil (ARICEPT) 5 MG tablet Take 1 tablet (5 mg total) by mouth at bedtime. (Patient taking differently: Take 5 mg by mouth daily.)   lisinopril (ZESTRIL) 20 MG tablet TAKE 1 TABLET BY MOUTH TWICE A DAY (Patient taking differently: Take 20 mg by mouth daily.)   omeprazole (PRILOSEC) 20 MG capsule Take 20 mg by mouth daily before breakfast.   [DISCONTINUED] cloNIDine (CATAPRES) 0.1 MG tablet Take 1 tablet (0.1  mg total) by mouth daily.     Allergies:    Iodinated contrast media, Cedar, and Diphenhydramine hcl   Social History: Social History   Socioeconomic History   Marital status: Married    Spouse name: [name]   Number of children: 3   Years of education: Not on file   Highest education level: Not on file  Occupational History   Not on file  Tobacco Use   Smoking status: Former   Smokeless tobacco: Former    Quit date: 10/27/1983  Vaping Use   Vaping Use: Never used  Substance and Sexual Activity   Alcohol use: No   Drug use: No   Sexual activity: Yes  Other Topics Concern   Not on file  Social History Narrative   Lives with husband at home   Right Handed   Drinks 4-6 cups caffeine daily      Three living children (lost one child in infancy and another in a MVA in  early 35s)   Social Determinants of Health   Financial Resource Strain: Low Risk    Difficulty of Paying Living Expenses: Not hard at all  Food Insecurity: No Food Insecurity   Worried About Charity fundraiser in the Last Year: Never true   Arboriculturist in the Last Year: Never true  Transportation Needs: No Transportation Needs   Lack of Transportation (Medical): No   Lack of Transportation (Non-Medical): No  Physical Activity: Insufficiently Active   Days of Exercise per Week: 3 days   Minutes of Exercise per Session: 30 min  Stress: No Stress Concern Present   Feeling of Stress : Not at all  Social Connections: Moderately Isolated   Frequency of Communication with Friends and Family: More than three times a week   Frequency of Social Gatherings with Friends and Family: Three times a week   Attends Religious Services: Never   Active Member of Clubs or Organizations: No   Attends Archivist Meetings: Never   Marital Status: Married     Family History: The patient's family history includes Diabetes in her maternal aunt and mother; Hypertension in her mother; Stroke in her brother.  ROS:   All other ROS reviewed and negative. Pertinent positives noted in the HPI.     EKGs/Labs/Other Studies Reviewed:   The following studies were personally reviewed by me today:  EKG:  EKG is ordered today.  The ekg ordered today demonstrates sinus bradycardia heart rate 54, LVH by voltage, and was personally reviewed by me.   Recent Labs: 08/07/2021: ALT 35; TSH 1.325 08/13/2021: BUN 11; Creatinine, Ser 0.92; Hemoglobin 12.5; Magnesium 1.9; Platelets 285.0; Potassium 3.6; Sodium 142   Recent Lipid Panel    Component Value Date/Time   CHOL 174 03/15/2020 0813   TRIG 141.0 03/15/2020 0813   HDL 51.30 03/15/2020 0813   CHOLHDL 3 03/15/2020 0813   VLDL 28.2 03/15/2020 0813   LDLCALC 95 03/15/2020 0813   LDLDIRECT 106.0 10/27/2010 0950    Physical Exam:   VS:  BP 138/88     Pulse (!) 54    Ht 5\' 3"  (1.6 m)    Wt 148 lb 12.8 oz (67.5 kg)    SpO2 98%    BMI 26.36 kg/m    Wt Readings from Last 3 Encounters:  08/29/21 148 lb 12.8 oz (67.5 kg)  08/13/21 151 lb 3.2 oz (68.6 kg)  08/08/21 150 lb (68 kg)    General: Well nourished, well developed,  in no acute distress Head: Atraumatic, normal size  Eyes: PEERLA, EOMI  Neck: Supple, no JVD Endocrine: No thryomegaly Cardiac: Normal S1, S2; RRR; no murmurs, rubs, or gallops Lungs: Clear to auscultation bilaterally, no wheezing, rhonchi or rales  Abd: Soft, nontender, no hepatomegaly  Ext: No edema, pulses 2+ Musculoskeletal: No deformities, BUE and BLE strength normal and equal Skin: Warm and dry, no rashes   Neuro: Alert and oriented to person, place, time, and situation, CNII-XII grossly intact, no focal deficits  Psych: Normal mood and affect   ASSESSMENT:   Michelle [name] is a 81 y.o. female who presents for the following: 1. Bradycardia     PLAN:   1. Bradycardia -She presents for the evaluation of bradycardia.  No symptoms from this.  EKG demonstrates sinus bradycardia with LVH and left axis deviation.  No evidence of high-grade conduction disease.  She has a narrow QRS.  Recent thyroid studies are normal.  She was on clonidine but is stopped this.  Her husband read that this causes bradycardia.  I did inform her that this is a known side effect of clonidine.  He has noticed improvement in her pulse with stopping clonidine.  Would recommend to continue to hold this medication.  Her cardiovascular examination is normal.  We will obtain an echocardiogram just to make sure everything is structurally normal.  She also is on donepezil which can also cause bradycardia.  This should be closely monitored while she is on this.  Regardless she has no symptoms concerning for any significant bradycardia.  She has no evidence of conduction disease.  For now would recommend a conservative approach.  We will stop her  clonidine and she will keep a close eye on symptoms.     Disposition: Return if symptoms worsen or fail to improve.  Medication Adjustments/Labs and Tests Ordered: Current medicines are reviewed at length with the patient today.  Concerns regarding medicines are outlined above.  Orders Placed This Encounter  Procedures   EKG 12-Lead   ECHOCARDIOGRAM COMPLETE   No orders of the defined types were placed in this encounter.   Patient Instructions  Medication Instructions:  STOP Clonidine   *If you need a refill on your cardiac medications before your next appointment, please call your pharmacy*   Testing/Procedures: Echocardiogram - Your physician has requested that you have an echocardiogram. Echocardiography is a painless test that uses sound waves to create images of your heart. It provides your doctor with information about the size and shape of your heart and how well your hearts chambers and valves are working. This procedure takes approximately one hour. There are no restrictions for this procedure. This will be performed at either our [facility] location - [address] [place] location [facility] 2nd floor.    Follow-Up: At [facility], you and your health needs are our priority.  As part of our continuing mission to provide you with exceptional heart care, we have created designated Provider Care Teams.  These Care Teams include your primary Cardiologist (physician) and Advanced Practice Providers (APPs -  Physician Assistants and Nurse Practitioners) who all work together to provide you with the care you need, when you need it.  We recommend signing up for the patient portal called "MyChart".  Sign up information is provided on this After Visit Summary.  MyChart is used to connect with patients for Virtual Visits (Telemedicine).  Patients are able to view lab/test results, encounter  notes, upcoming appointments, etc.  Non-urgent messages  can be sent to your provider as well.   To learn more about what you can do with MyChart, go to [email or web address].    Your next appointment:   As needed  The format for your next appointment:   In Person  Provider:   Eleonore Chiquito, MD        Signed, Addison Naegeli. Audie Box, MD, [place]  [address] [place], [place] [address] [phone]  08/29/2021 10:30 AM

## 2021-08-29 NOTE — Patient Instructions (Signed)
Medication Instructions:  STOP Clonidine   *If you need a refill on your cardiac medications before your next appointment, please call your pharmacy*   Testing/Procedures: Echocardiogram - Your physician has requested that you have an echocardiogram. Echocardiography is a painless test that uses sound waves to create images of your heart. It provides your doctor with information about the size and shape of your heart and how well your hearts chambers and valves are working. This procedure takes approximately one hour. There are no restrictions for this procedure. This will be performed at either our [facility] location - [address], [place] location [facility] 2nd floor.    Follow-Up: At [facility], you and your health needs are our priority.  As part of our continuing mission to provide you with exceptional heart care, we have created designated Provider Care Teams.  These Care Teams include your primary Cardiologist (physician) and Advanced Practice Providers (APPs -  Physician Assistants and Nurse Practitioners) who all work together to provide you with the care you need, when you need it.  We recommend signing up for the patient portal called "MyChart".  Sign up information is provided on this After Visit Summary.  MyChart is used to connect with patients for Virtual Visits (Telemedicine).  Patients are able to view lab/test results, encounter notes, upcoming appointments, etc.  Non-urgent messages can be sent to your provider as well.   To learn more about what you can do with MyChart, go to [email or web address].    Your next appointment:   As needed  The format for your next appointment:   In Person  Provider:   Eleonore Chiquito, MD

## 2021-09-09 ENCOUNTER — Ambulatory Visit (HOSPITAL_COMMUNITY): Payer: Medicare HMO | Attending: Cardiology

## 2021-09-09 ENCOUNTER — Other Ambulatory Visit: Payer: Self-pay

## 2021-09-09 DIAGNOSIS — I10 Essential (primary) hypertension: Secondary | ICD-10-CM

## 2021-09-09 DIAGNOSIS — R001 Bradycardia, unspecified: Secondary | ICD-10-CM | POA: Diagnosis not present

## 2021-09-09 LAB — ECHOCARDIOGRAM COMPLETE
Area-P 1/2: 3.15 cm2
MV M vel: 6.39 m/s
MV Peak grad: 163.3 mmHg
MV VTI: 1.51 cm2
S' Lateral: 2.4 cm

## 2021-10-22 ENCOUNTER — Other Ambulatory Visit: Payer: Self-pay | Admitting: Family Medicine

## 2021-10-22 DIAGNOSIS — Z1231 Encounter for screening mammogram for malignant neoplasm of breast: Secondary | ICD-10-CM

## 2021-11-04 ENCOUNTER — Encounter: Payer: Self-pay | Admitting: Family Medicine

## 2021-11-07 ENCOUNTER — Ambulatory Visit (INDEPENDENT_AMBULATORY_CARE_PROVIDER_SITE_OTHER): Payer: Medicare HMO | Admitting: Family Medicine

## 2021-11-07 VITALS — BP 124/70 | HR 81 | Temp 97.9°F | Ht 63.0 in | Wt 146.0 lb

## 2021-11-07 DIAGNOSIS — G3184 Mild cognitive impairment, so stated: Secondary | ICD-10-CM

## 2021-11-07 DIAGNOSIS — H5316 Psychophysical visual disturbances: Secondary | ICD-10-CM

## 2021-11-07 DIAGNOSIS — N3000 Acute cystitis without hematuria: Secondary | ICD-10-CM

## 2021-11-07 DIAGNOSIS — I10 Essential (primary) hypertension: Secondary | ICD-10-CM | POA: Diagnosis not present

## 2021-11-07 LAB — POCT URINALYSIS DIPSTICK
Bilirubin, UA: NEGATIVE
Blood, UA: NEGATIVE
Glucose, UA: NEGATIVE
Ketones, UA: NEGATIVE
Nitrite, UA: NEGATIVE
Protein, UA: NEGATIVE
Spec Grav, UA: 1.01 (ref 1.010–1.025)
Urobilinogen, UA: 0.2 E.U./dL
pH, UA: 6.5 (ref 5.0–8.0)

## 2021-11-07 MED ORDER — SULFAMETHOXAZOLE-TRIMETHOPRIM 800-160 MG PO TABS: 1.0000 | ORAL_TABLET | Freq: Two times a day (BID) | ORAL | 0 refills | Status: DC

## 2021-11-07 NOTE — Progress Notes (Signed)
?[facility] ?[facility] ?[place] ?[place] [address] ?Dept: [phone] ?Dept Fax: [phone] ? ?Chronic Care Office Visit ? ?Subjective:  ? ? Patient ID: Michelle [name], female    DOB: [birth year], 81 y.o..   MRN: [id number] ? ?Chief Complaint  ?Patient presents with  ? Follow-up  ?  3 month f/u .  C/o having a burning sensation with urination x 2 days.   ? ? ?History of Present Illness: ? ?Patient is in today for reassessment of chronic medical issues. ? ?Ms. Wich has a history of hypertension. She is currently managed on lisinopril. She also has a history of Stage 3 a chronic kidney disease and renal artery stenosis/fibromuscular hyperplasia. She follows with [name]. ?  ?Ms. Campi has a history of macular degeneration with visual loss. She has received bevacizumab (Avastin) injections for this. 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 She feels frustrated by ongoing issues with this. She doe generally manage most times. He retinal specialist did add furosemide to her therapy for the macular edema. ?  ?Additionally, Ms. Jeschke has a history of mild cognitive impairment. She was evaluated earlier this year by [name]. She is currently on Aricept, which could slow down cognitive loss, but also might help the Mattel Syndrome. Apparently, Ms. Harpenau is no longer taking the Aricept. ? ?Ms. Mcvey complains of a two-day history of dysuria. She had a complicated UTI in Jan. and was hospitalized. ? ?Past Medical History: ?Patient Active Problem List  ? Diagnosis Date Noted  ? Confusion 08/06/2021  ? Normocytic anemia 05/12/2021  ? Chronic kidney disease, stage 3a ([place]) 03/11/2021  ? Gastroesophageal reflux disease 03/11/2021  ? Mild cognitive impairment with memory loss 12/13/2020  ? Meningioma ([place]) 12/13/2020  ? [name] 09/17/2019  ? 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 Macular degeneration of both eyes 08/11/2018  ? Essential hypertension 07/03/2014  ? History of renal artery stenosis 06/14/2014  ? Squamous cell carcinoma in situ of skin 02/08/2013  ? Dysplastic nevus of trunk 01/23/2013  ? Contact dermatitis and eczema 10/31/2012  ? Osteopenia 09/29/2007  ? Allergic rhinitis 02/24/2007  ? Generalized OA 02/24/2007  ? ?Past Surgical History:  ?Procedure Laterality Date  ? ANGIOPLASTY    ? right renal 1981  ? BTL    ? cb x 3    ? HEMORRHOID SURGERY    ? IR PTA RENAL VISCERAL  05/11/2017  ? IR RADIOLOGIST EVAL & MGMT  04/21/2017  ? IR RENAL BILAT S&I MOD SED  05/11/2017  ? IR US GUIDE VASC ACCESS RIGHT  05/11/2017  ? Rt ovarian cyst removed    ? TONSILLECTOMY AND ADENOIDECTOMY    ? ?Family History  ?Problem Relation Age of Onset  ? Diabetes Mother   ? Hypertension Mother   ? Stroke Brother   ? Diabetes Maternal Aunt   ? ?Outpatient Medications Prior to Visit  ?Medication Sig Dispense Refill  ? Bevacizumab (AVASTIN) 100 MG/4ML SOLN Inject 1-10 mg into the eye See admin instructions. For eyes ?One every 6 weeks 1/10 mg    ? cetirizine (ZYRTEC) 10 MG tablet Take 10 mg by mouth daily as needed for allergies.    ? furosemide (LASIX) 20 MG tablet Take 20 mg by mouth daily.    ? lisinopril (ZESTRIL) 40 MG tablet Take 40 mg by mouth daily.    ? omeprazole (PRILOSEC) 20  MG capsule Take 20 mg by mouth daily before breakfast.    ? donepezil (ARICEPT) 5 MG tablet Take 1 tablet (5 mg total) by mouth at bedtime. (Patient taking differently: Take 5 mg by mouth daily.) 90 tablet 3  ? lisinopril (ZESTRIL) 20 MG tablet TAKE 1 TABLET BY MOUTH TWICE A DAY (Patient taking differently: Take 20 mg by mouth daily.) 60 tablet 2  ? calcium-vitamin D (OSCAL WITH D) 500-200 MG-UNIT tablet Take 1 tablet by mouth daily with breakfast. (Patient not taking: Reported on 11/07/2021) 90 tablet 3  ? ?No facility-administered medications prior to visit.  ? ?Allergies  ?Allergen Reactions  ? Iodinated  Contrast Media Hives  ?  Patient believes this was an older form of iodinated contrast.  Tolerated OMNIPAQUE IOHEXOL 11/19/14 for renal arteriogram/angioplasty without prep. [name], RN  ? Cedar Other (See Comments)  ?  unknown  ? Diphenhydramine Hcl Hives  ?  ?Objective:  ? ?Today's Vitals  ? 11/07/21 0959  ?BP: 124/70  ?Pulse: 81  ?Temp: 97.9 ?F (36.6 ?C)  ?TempSrc: Temporal  ?SpO2: 94%  ?Weight: 146 lb (66.2 kg)  ?Height: '5\' 3"'$  (1.6 m)  ? ?Body mass index is 25.86 kg/m?.  ? ?General: Well developed, well nourished. No acute distress. ?Psych: Alert and oriented. Normal mood and affect. ? ?There are no preventive care reminders to display for this patient. ? ?Lab Results ?Urine dipstick shows negative for nitrites, red blood cells, glucose, protein, ketones, urobilinogen, positive for leukocytes. ? ?Assessment & Plan:  ? ?1. Essential hypertension ?BP is at goal. We will continue lisinopril 20 mg daily. ? ?2. [name] ?There are multiple medications that might provide some reduction in the visual hallucinations associated with [name]. However, I have no experience to draw upon related to which might be best to try. I will refer Ms. Roher back to [name] to consider next steps. ? ?- Ambulatory referral to Neurology ? ?3. Mild cognitive impairment with memory loss ?Patient is no longer taking Aricept. ? ?4. Acute cystitis without hematuria ?Urine is suggestive of an acute UTI. In light of her past issues, I will go ahead and treat ehr with a 3-day course of antibiotics. ? ?- POCT Urinalysis Dipstick ?- sulfamethoxazole-trimethoprim (BACTRIM DS) 800-160 MG tablet; Take 1 tablet by mouth 2 (two) times daily.  Dispense: 6 tablet; Refill: 0 ?- Urine Culture ? ?Return in about 3 months (around 02/06/2022) for Reassessment.  ? ?Haydee Salter, MD ?

## 2021-11-09 LAB — URINE CULTURE
MICRO NUMBER:: 13265591
SPECIMEN QUALITY:: ADEQUATE

## 2021-11-11 ENCOUNTER — Ambulatory Visit: Payer: Medicare HMO | Admitting: Family Medicine

## 2021-11-12 ENCOUNTER — Ambulatory Visit
Admission: RE | Admit: 2021-11-12 | Discharge: 2021-11-12 | Disposition: A | Discharge status: Home / Self Care | Payer: Medicare HMO | Source: Ambulatory Visit | Attending: Family Medicine | Admitting: Family Medicine

## 2021-11-12 DIAGNOSIS — Z1231 Encounter for screening mammogram for malignant neoplasm of breast: Secondary | ICD-10-CM

## 2021-11-23 ENCOUNTER — Encounter: Payer: Self-pay | Admitting: Neurology

## 2021-11-26 ENCOUNTER — Ambulatory Visit (INDEPENDENT_AMBULATORY_CARE_PROVIDER_SITE_OTHER): Payer: Medicare HMO | Admitting: Neurology

## 2021-11-26 ENCOUNTER — Encounter: Payer: Self-pay | Admitting: Neurology

## 2021-11-26 VITALS — BP 135/65 | HR 67 | Ht 63.0 in | Wt 144.2 lb

## 2021-11-26 DIAGNOSIS — R413 Other amnesia: Secondary | ICD-10-CM

## 2021-11-26 DIAGNOSIS — H5316 Psychophysical visual disturbances: Secondary | ICD-10-CM

## 2021-11-26 DIAGNOSIS — R441 Visual hallucinations: Secondary | ICD-10-CM | POA: Diagnosis not present

## 2021-11-26 MED ORDER — MEMANTINE HCL 28 X 5 MG & 21 X 10 MG PO TABS: ORAL_TABLET | ORAL | 12 refills | Status: DC

## 2021-11-26 MED ORDER — MEMANTINE HCL 10 MG PO TABS: 10.0000 mg | ORAL_TABLET | Freq: Two times a day (BID) | ORAL | 3 refills | Status: DC

## 2021-11-26 NOTE — Patient Instructions (Addendum)
I had a long discussion with patient and her husband regarding her visual hallucinations and memory loss and cognitive impairment.  I reviewed results of her lab work for reversible causes of memory loss as well as EEG and answered questions.  I recommend a trial of Namenda starting with a starter pack and increase as tolerated and then 10 mg twice daily.  She was also counseled about her different visual hallucinations and not to act on them.  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 ?

## 2021-11-26 NOTE — Progress Notes (Signed)
?[facility] ?[address] ?[place]. [place] [address] ?[phone] ? ?     OFFICE CONSULT NOTE ? ?Michelle [name] ?Date of Birth:  10/04/1940 ?Medical Record Number:  [id number]  ? ?Referring MD: Riccardo Dubin Arrien ? ?Reason for Referral: Confusion ? ?HPI: Initial visit 12/09/2020: Michelle [name] is a 81 year old Caucasian lady seen today for initial office consultation visit.  History is obtained from the patient and her husband who is accompanying her today as well as review of electronic medical records and I personally reviewed pertinent available imaging films in PACS.  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  She was recently admitted to [facility] on 12/01/2020 with E. coli bacteremia and at that time was found to have altered mental status with confusion disorientation.  She also had transient weakness and slurred speech and presyncopal symptoms.  She was treated with IV Rocephin and discharged on cefdinir.  CT scan of the head showed incidental finding of a calcified mass in the pineal region.  She felt better on antibiotics and was discharged home.  When her urine culture came back positive for E. coli she was called back to come to the hospital.  She was referred to neurology for evaluation for abnormal CT scan.  An MRI scan of the brain was obtained without contrast on 12/02/2020 which I personally reviewed and shows a heterogeneous calcified T2 hypointense extra-axial mass located posterior and superior to the pineal gland measuring 1.5 into 1 cm with mild mass-effect on the splenium of corpus callosum with a 1.1 cm cyst in the pineal gland itself.  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   The patient and her husband's main complaint today is about her short-term memory loss which she has had for about a year.  She has trouble remembering recent information and forgets how to write things down.  She is aware of functional quite independent and manages her own affairs well.  The patient is also had formed fixed visual hallucinations and sees people's in the room.  This has been attributed to her severe macular degeneration and she has seen ophthalmologist Dr. Zigmund Daniel who is diagnosed her with [name].  Patient is functionally blind and can barely see objects at 2 to 3 feet distance.  She knows the hallucinations are not real and does get a little anxious.  She has been started on Klonopin which seems to help her anxiety.  She denies any headache episodes of prior stroke, TIA, seizures or loss of consciousness.  There is no family history of dementia.  Her cognitive impairment testing was limited today due to her severe visual impairment but the head 3 word recall was diminished at 0/3 and she was able to name only 7 animals which can walk on 4 legs. ?Update 11/26/2021 : She returns for follow-up after last visit a year ago.  She was supposed to follow-up earlier but appointments were rescheduled a couple of times.  Patient had lab work which I ordered at the last visit and vitamin B12, TSH and RPR were all normal on 11/29/2020.  EEG done on 12/11/2020 showed mild generalized slowing without any epileptiform activity.  Patient was started on Aricept by me which she took for nearly 5 months and discontinued  as they did not notice any benefit.  She was able to tolerate however without side effects.  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  She continues to have very poor vision which is mostly cloudy.  She continues to have complex visual hallucinations in the form of seeing  people or animals.  At times she also she is in forms and patents.  She continues to have poor short-term memory and cognitive difficulties but these do not appear to be significantly progressing.  She is able to ambulate inside her house without with a cane but when going outdoors and in unfamiliar situations she has to hold her husband's hand.  On her limited cognitive testing today due to her visual impairment she had 0/3 on recall and was able to name 11 animals. ? ?ROS:   ?14 system review of systems is positive for poor vision, visual hallucinations, memory loss, gait difficulties, bruising and all other systems negative ? ?PMH:  ?Past Medical History:  ?Diagnosis Date  ? Allergic rhinitis   ? Cystocele   ? Fibromuscular dysplasia of renal artery (HCC)   ? Hypertension   ? fibromuscular dysplasia  ? Osteoarthritis   ? Osteoporosis   ? PMS (premenstrual syndrome)   ? Retinal hemorrhage, right   ? with macular degeneration  ? ? ?Social History:  ?Social History  ? ?Socioeconomic History  ? Marital status: Married  ?  Spouse name: [name]  ? Number of children: 3  ? Years of education: Not on file  ? Highest education level: Not on file  ?Occupational History  ? Not on file  ?Tobacco Use  ? Smoking status: Former  ? Smokeless tobacco: Former  ?  Quit date: 10/27/1983  ?Vaping Use  ? Vaping Use: Never used  ?Substance and Sexual Activity  ? Alcohol use: No  ? Drug use: No  ? Sexual activity: Yes  ?Other Topics Concern  ? Not on file  ?Social History Narrative  ? Lives with husband at home  ? Right Handed  ? Drinks 4-6 cups caffeine daily  ?   ? Three living children (lost one child in infancy and another in a MVA in early 98s)  ? ?Social Determinants of Health  ? ?Financial Resource Strain: Low Risk   ? Difficulty of Paying Living Expenses: Not hard at all  ?Food Insecurity: No Food Insecurity  ? Worried About Charity fundraiser in the Last Year: Never true  ? Ran Out of Food in the Last Year: Never true   ?Transportation Needs: No Transportation Needs  ? Lack of Transportation (Medical): No  ? Lack of Transportation (Non-Medical): No  ?Physical Activity: Insufficiently Active  ? Days of Exercise per Week: 3 days  ? Minutes of Exercise per Session: 30 min  ?Stress: No Stress Concern Present  ? Feeling of Stress : Not at all  ?Social Connections: Moderately Isolated  ? Frequency of Communication with Friends and Family: More than three times a week  ? Frequency of Social Gatherings with Friends and Family: Three times a week  ? Attends Religious Services: Never  ? Active Member of Clubs or Organizations: No  ? Attends Archivist Meetings: Never  ? Marital Status: Married  ?Intimate Partner Violence: Not At Risk  ? Fear of Current or Ex-Partner: No  ? Emotionally Abused: No  ? Physically Abused: No  ? Sexually  Abused: No  ? ? ?Medications:   ?Current Outpatient Medications on File Prior to Visit  ?Medication Sig Dispense Refill  ? Bevacizumab (AVASTIN) 100 MG/4ML SOLN Inject 1-10 mg into the eye See admin instructions. For eyes ?One every 6 weeks 1/10 mg    ? calcium-vitamin D (OSCAL WITH D) 500-200 MG-UNIT tablet Take 1 tablet by mouth daily with breakfast. 90 tablet 3  ? cetirizine (ZYRTEC) 10 MG tablet Take 10 mg by mouth daily as needed for allergies.    ? furosemide (LASIX) 20 MG tablet Take 20 mg by mouth daily.    ? lisinopril (ZESTRIL) 40 MG tablet Take 40 mg by mouth daily.    ? omeprazole (PRILOSEC) 20 MG capsule Take 20 mg by mouth daily before breakfast.    ? sulfamethoxazole-trimethoprim (BACTRIM DS) 800-160 MG tablet Take 1 tablet by mouth 2 (two) times daily. 6 tablet 0  ? ?No current facility-administered medications on file prior to visit.  ? ? ?Allergies:   ?Allergies  ?Allergen Reactions  ? Iodinated Contrast Media Hives  ?  Patient believes this was an older form of iodinated contrast.  Tolerated OMNIPAQUE IOHEXOL 11/19/14 for renal arteriogram/angioplasty without prep. [name], RN  ? [place]  Other (See Comments)  ?  unknown  ? Diphenhydramine Hcl Hives  ? ? ?Physical Exam ?General: Frail elderly Caucasian lady, seated, in no evident distress ?Head: head normocephalic and atraumatic.   ?Neck: supple wit

## 2021-12-16 ENCOUNTER — Other Ambulatory Visit: Payer: Self-pay | Admitting: Neurology

## 2021-12-16 NOTE — Telephone Encounter (Signed)
Error

## 2021-12-23 ENCOUNTER — Encounter: Payer: Self-pay | Admitting: Neurology

## 2022-01-06 ENCOUNTER — Encounter: Payer: Self-pay | Admitting: Neurology

## 2022-01-07 ENCOUNTER — Other Ambulatory Visit: Payer: Self-pay | Admitting: Neurology

## 2022-01-07 ENCOUNTER — Ambulatory Visit: Payer: Medicare HMO | Admitting: Neurology

## 2022-01-07 DIAGNOSIS — R443 Hallucinations, unspecified: Secondary | ICD-10-CM

## 2022-01-07 MED ORDER — TRAZODONE 25 MG HALF TABLET: 50.0000 mg | ORAL_TABLET | Freq: Every day | ORAL | Status: DC

## 2022-01-12 ENCOUNTER — Telehealth: Payer: Self-pay | Admitting: Neurology

## 2022-01-12 ENCOUNTER — Other Ambulatory Visit: Payer: Self-pay | Admitting: *Deleted

## 2022-01-12 MED ORDER — TRAZODONE HCL 50 MG PO TABS
50.0000 mg | ORAL_TABLET | Freq: Every day | ORAL | 2 refills | Status: DC
Start: 2022-01-12 — End: 2022-02-03

## 2022-01-12 NOTE — Telephone Encounter (Signed)
LVM and mychart msg for need to have 8/23 appointment rescheduled - MD out

## 2022-01-15 ENCOUNTER — Other Ambulatory Visit: Payer: Self-pay | Admitting: Neurology

## 2022-01-19 ENCOUNTER — Encounter: Payer: Self-pay | Admitting: Neurology

## 2022-02-03 ENCOUNTER — Other Ambulatory Visit: Payer: Self-pay | Admitting: Neurology

## 2022-03-04 ENCOUNTER — Ambulatory Visit: Payer: Medicare HMO | Admitting: Neurology

## 2022-03-18 ENCOUNTER — Ambulatory Visit: Payer: Medicare HMO | Admitting: Neurology

## 2022-04-07 ENCOUNTER — Encounter: Payer: Self-pay | Admitting: Family Medicine

## 2022-04-09 ENCOUNTER — Encounter: Payer: Self-pay | Admitting: Family Medicine

## 2022-04-09 ENCOUNTER — Ambulatory Visit (INDEPENDENT_AMBULATORY_CARE_PROVIDER_SITE_OTHER): Payer: Medicare HMO | Admitting: Family Medicine

## 2022-04-09 VITALS — BP 130/82 | HR 61 | Temp 97.1°F | Ht 63.0 in | Wt 143.8 lb

## 2022-04-09 DIAGNOSIS — Z87440 Personal history of urinary (tract) infections: Secondary | ICD-10-CM | POA: Diagnosis not present

## 2022-04-09 DIAGNOSIS — I10 Essential (primary) hypertension: Secondary | ICD-10-CM | POA: Diagnosis not present

## 2022-04-09 DIAGNOSIS — E663 Overweight: Secondary | ICD-10-CM | POA: Insufficient documentation

## 2022-04-09 DIAGNOSIS — H5316 Psychophysical visual disturbances: Secondary | ICD-10-CM

## 2022-04-09 DIAGNOSIS — Z23 Encounter for immunization: Secondary | ICD-10-CM

## 2022-04-09 DIAGNOSIS — G3184 Mild cognitive impairment, so stated: Secondary | ICD-10-CM

## 2022-04-09 LAB — POCT URINALYSIS DIPSTICK
Bilirubin, UA: NEGATIVE
Blood, UA: NEGATIVE
Glucose, UA: NEGATIVE
Ketones, UA: NEGATIVE
Leukocytes, UA: NEGATIVE
Nitrite, UA: NEGATIVE
Protein, UA: NEGATIVE
Spec Grav, UA: 1.01 (ref 1.010–1.025)
Urobilinogen, UA: 0.2 E.U./dL
pH, UA: 6 (ref 5.0–8.0)

## 2022-04-09 MED ORDER — QUETIAPINE FUMARATE 25 MG PO TABS: 25.0000 mg | ORAL_TABLET | Freq: Two times a day (BID) | ORAL | 3 refills | Status: DC

## 2022-04-09 NOTE — Progress Notes (Signed)
[facility] Michelle [name] Michelle [name] [address] Dept: [phone] Dept Fax: [phone]  Chronic Care Office Visit  Subjective:    Patient ID: Michelle [name], female    DOB: Mar 08, 1941, 81 y.o..   MRN: [id number]  Chief Complaint  Patient presents with   Follow-up    3 month f/u.  C/o vision, seeing things, agitation.  Wants flu shot today .    History of Present Illness:  Patient is in today for reassessment of chronic medical issues.  Michelle [name] has a history of hypertension. She is currently managed on lisinopril 40 mg daily. She also has a history of Stage 3 a chronic kidney disease and renal artery stenosis/fibromuscular hyperplasia. She follows with [name].   Michelle [name] has a history of macular degeneration with visual loss. She has received bevacizumab (Avastin) injections for this. The vision loss has left her with Michelle [name], in which she has visual hallucinations. Additionally, Michelle [name] has a history of mild cognitive impairment. She is being followed by [name]. He had prescribed her donepezil (Aricept) and them later memantine, neither of which seemed to help. Michelle [name] also felt the memantine was causing some dizziness. Her husband notes that she has episodes once a day, where Michelle [name] appears to not know where she is or who is with her. He can typically reorient her and she does well the rest of the day. Michelle [name] notes she isn't sure what happens during those spells, as she doesn't recall them. She admits to ongoing hallucinations, seeing people. She finds this disconcerting, as she has always had a bit of fear of strangers.   Michelle [name] has a recent UTI and her family was concerned about whether this was still present.  Past Medical History: Patient Active Problem List   Diagnosis Date Noted   Overweight (BMI 25.0-29.9) 04/09/2022   Confusion 08/06/2021   Normocytic anemia 05/12/2021   Chronic  kidney disease, stage 3a ([place]) 03/11/2021   Gastroesophageal reflux disease 03/11/2021   Mild cognitive impairment with memory loss 12/13/2020   Meningioma ([place]) 12/13/2020   Michelle [name] 09/17/2019   Hyperplasia, renal artery fibromuscular ([place]) 08/11/2018   Macular degeneration of both eyes 08/11/2018   Essential hypertension 07/03/2014   History of renal artery stenosis 06/14/2014   Squamous cell carcinoma in situ of skin 02/08/2013   Dysplastic nevus of trunk 01/23/2013   Contact dermatitis and eczema 10/31/2012   Osteopenia 09/29/2007   Allergic rhinitis 02/24/2007   Generalized OA 02/24/2007   Past Surgical History:  Procedure Laterality Date   ANGIOPLASTY     right renal 1981   BTL     cb x 3     HEMORRHOID SURGERY     IR PTA RENAL VISCERAL  05/11/2017   IR RADIOLOGIST EVAL & MGMT  04/21/2017   IR RENAL BILAT S&I MOD SED  05/11/2017   IR US GUIDE VASC ACCESS RIGHT  05/11/2017   Rt ovarian cyst removed     TONSILLECTOMY AND ADENOIDECTOMY     Family History  Problem Relation Age of Onset   Diabetes Mother    Hypertension Mother    Stroke Brother    Diabetes Maternal Aunt    Outpatient Medications Prior to Visit  Medication Sig Dispense Refill   Bevacizumab (AVASTIN) 100 MG/4ML SOLN Inject 1-10 mg into the eye See admin instructions. For eyes One every 6 weeks 1/10 mg     furosemide (  LASIX) 20 MG tablet Take 20 mg by mouth daily.     ibuprofen (ADVIL) 200 MG tablet Take 200 mg by mouth every 6 (six) hours as needed.     lisinopril (ZESTRIL) 40 MG tablet Take 40 mg by mouth daily.     omeprazole (PRILOSEC) 20 MG capsule Take 20 mg by mouth daily before breakfast.     cetirizine (ZYRTEC) 10 MG tablet Take 10 mg by mouth daily as needed for allergies.     calcium-vitamin D (OSCAL WITH D) 500-200 MG-UNIT tablet Take 1 tablet by mouth daily with breakfast. (Patient not taking: Reported on 04/09/2022) 90 tablet 3   memantine (NAMENDA TITRATION PAK) tablet pack 5  mg/day for =1 week; 5 mg twice daily for =1 week; 15 mg/day given in 5 mg and 10 mg separated doses for =1 week; then 10 mg twice daily 49 tablet 12   memantine (NAMENDA) 10 MG tablet TAKE 1 TABLET (10 MG TOTAL) BY MOUTH 2 (TWO) TIMES DAILY. START ONLY AFTER FINISHING TITRATION PACK (Patient not taking: Reported on 04/09/2022) 180 tablet 2   sulfamethoxazole-trimethoprim (BACTRIM DS) 800-160 MG tablet Take 1 tablet by mouth 2 (two) times daily. 6 tablet 0   traZODone (DESYREL) 50 MG tablet TAKE 1 TABLET BY MOUTH EVERYDAY AT BEDTIME (Patient not taking: Reported on 04/09/2022) 90 tablet 0   No facility-administered medications prior to visit.   Allergies  Allergen Reactions   Iodinated Contrast Media Hives    Patient believes this was an older form of iodinated contrast.  Tolerated OMNIPAQUE IOHEXOL 11/19/14 for renal arteriogram/angioplasty without prep. Michelle Lore, RN   Katharine Look Other (See Comments)    unknown   Diphenhydramine Hcl Hives   Objective:   Today's Vitals   04/09/22 0924  BP: 130/82  Pulse: 61  Temp: (!) 97.1 F (36.2 C)  TempSrc: Temporal  SpO2: 98%  Weight: 143 lb 12.8 oz (65.2 kg)  Height: '5\' 3"'$  (1.6 m)   Body mass index is 25.47 kg/m.   General: Well developed, well nourished. No acute distress. Psych: Alert and oriented. Normal mood and affect.  Health Maintenance Due  Topic Date Due   INFLUENZA VACCINE  02/24/2022   Lab Results POCT UA: Component Ref Range & Units 09:35 (04/09/22)  Clarity, UA  clear   Glucose, UA Negative Negative   Bilirubin, UA  neg   Ketones, UA  neg   Spec Grav, UA 1.010 - 1.025 1.010   Blood, UA  neg   pH, UA 5.0 - 8.0 6.0   Protein, UA Negative Negative   Urobilinogen, UA 0.2 or 1.0 E.U./dL 0.2   Nitrite, UA  neg   Leukocytes, UA Negative Negative   Appearance  pale yellow       Assessment & Plan:   1. Michelle Needs [name] Michelle [name] finds the hallucinations associated with her condition to be disconcerting. As she has not  responded to donepizil, there is some evidence that a low dose 2nd generation antipsychotic can help a patient manage with the hallucinations better. her family is open to a trial to see if this will help. She has an upcoming appointment with [name], which I urged them to keep, as I want him to be in the loop regarding how we approach this and ongoing monitoring elated to dementia.  - QUEtiapine (SEROQUEL) 25 MG tablet; Take 1 tablet (25 mg total) by mouth 2 (two) times daily.  Dispense: 60 tablet; Refill: 3  2. History of UTI  UA is normal today. No sign of infection.  - POCT Urinalysis Dipstick  3. Essential hypertension Blood pressure is at goal. Continue lisinopril 40 mg daily.   4. Mild cognitive impairment with memory loss Patient has stopped memantine due to dizziness.  Return in about 4 weeks (around 05/07/2022) for Reassessment.   Haydee Salter, MD

## 2022-04-14 ENCOUNTER — Encounter: Payer: Self-pay | Admitting: Neurology

## 2022-04-19 ENCOUNTER — Encounter: Payer: Self-pay | Admitting: Family Medicine

## 2022-04-20 NOTE — Telephone Encounter (Signed)
Please review and advise. Thanks. Dm/cma  

## 2022-05-01 ENCOUNTER — Other Ambulatory Visit: Payer: Self-pay | Admitting: Family Medicine

## 2022-05-01 DIAGNOSIS — H5316 Psychophysical visual disturbances: Secondary | ICD-10-CM

## 2022-05-04 ENCOUNTER — Other Ambulatory Visit: Payer: Self-pay | Admitting: Family Medicine

## 2022-05-04 NOTE — Telephone Encounter (Signed)
Will fill at appt on 05/05/22.  Dm/cma

## 2022-05-05 ENCOUNTER — Ambulatory Visit (INDEPENDENT_AMBULATORY_CARE_PROVIDER_SITE_OTHER): Payer: Medicare HMO | Admitting: Family Medicine

## 2022-05-05 ENCOUNTER — Encounter: Payer: Self-pay | Admitting: Family Medicine

## 2022-05-05 VITALS — BP 120/72 | HR 55 | Temp 97.0°F | Ht 63.0 in | Wt 146.4 lb

## 2022-05-05 DIAGNOSIS — H5316 Psychophysical visual disturbances: Secondary | ICD-10-CM

## 2022-05-05 DIAGNOSIS — G3184 Mild cognitive impairment, so stated: Secondary | ICD-10-CM | POA: Diagnosis not present

## 2022-05-05 NOTE — Progress Notes (Signed)
[facility] Francene Find[address] [place] [address] Dept: [phone] Dept Fax: [phone]  Chronic Care Office Visit  Subjective:    Patient ID: Michelle [name], female    DOB: 03/09/1941, 81 y.o..   MRN: [id number]  Chief Complaint  Patient presents with   Follow-up    F/u meds.      History of Present Illness:  Patient is in today for reassessment of chronic medical issues.  Michelle [name] has a history of macular degeneration with visual loss. She has received bevacizumab (Avastin) injections for this. The vision loss has resulted in Michelle [name], in which she has visual hallucinations. Additionally, Michelle [name] has a history of mild cognitive impairment. She is being followed by Dr. Leonie [name].  At a recent visit with Michelle [name] and her family, we discussed the growing issue with how her hallucinations were impacting her. She had been on donepezil (Aricept), which had not seemed ot help. We agreed to a trial of quetiapine 25 mg bid. Michelle [name] feel that this has led to an improvement. Michelle [name] notes she has fewer hallucinations and that the ones she has are less aggressive, therefore less disturbing, than before. She is sleeping well at this point. She is continuing the memantine (Namenda) 10 mg daily. She and her [name] note that she has not had much improvement in her memory with this medication.  Past Medical History: Patient Active Problem List   Diagnosis Date Noted   Overweight (BMI 25.0-29.9) 04/09/2022   Confusion 08/06/2021   Normocytic anemia 05/12/2021   Chronic kidney disease, stage 3a ([place]) 03/11/2021   Gastroesophageal reflux disease 03/11/2021   Mild cognitive impairment with memory loss 12/13/2020   Meningioma ([place]) 12/13/2020   [name] 09/17/2019   Hyperplasia, renal artery fibromuscular ([place]) 08/11/2018   Macular degeneration of both eyes 08/11/2018   Essential hypertension  07/03/2014   History of renal artery stenosis 06/14/2014   Squamous cell carcinoma in situ of skin 02/08/2013   Dysplastic nevus of trunk 01/23/2013   Contact dermatitis and eczema 10/31/2012   Osteopenia 09/29/2007   Allergic rhinitis 02/24/2007   Generalized OA 02/24/2007   Past Surgical History:  Procedure Laterality Date   ANGIOPLASTY     right renal 1981   BTL     cb x 3     HEMORRHOID SURGERY     IR PTA RENAL VISCERAL  05/11/2017   IR RADIOLOGIST EVAL & MGMT  04/21/2017   IR RENAL BILAT S&I MOD SED  05/11/2017   IR US GUIDE VASC ACCESS RIGHT  05/11/2017   Rt ovarian cyst removed     TONSILLECTOMY AND ADENOIDECTOMY     Family History  Problem Relation Age of Onset   Diabetes Mother    Hypertension Mother    Stroke Brother    Diabetes Maternal Aunt    Outpatient Medications Prior to Visit  Medication Sig Dispense Refill   Bevacizumab (AVASTIN) 100 MG/4ML SOLN Inject 1-10 mg into the eye See admin instructions. For eyes One every 6 weeks 1/10 mg     furosemide (LASIX) 20 MG tablet Take 20 mg by mouth daily.     ibuprofen (ADVIL) 200 MG tablet Take 200 mg by mouth every 6 (six) hours as needed.     lisinopril (ZESTRIL) 40 MG tablet Take 40 mg by mouth daily.     memantine (NAMENDA) 10 MG tablet Take 10 mg by mouth at bedtime.  omeprazole (PRILOSEC) 20 MG capsule Take 20 mg by mouth daily before breakfast.     QUEtiapine (SEROQUEL) 25 MG tablet TAKE 1 TABLET BY MOUTH TWICE A DAY 180 tablet 2   No facility-administered medications prior to visit.   Allergies  Allergen Reactions   Iodinated Contrast Media Hives    Patient believes this was an older form of iodinated contrast.  Tolerated OMNIPAQUE IOHEXOL 11/19/14 for renal arteriogram/angioplasty without prep. [name], RN   Katharine Look Other (See Comments)    unknown   Diphenhydramine Hcl Hives    Objective:   Today's Vitals   05/05/22 0921  BP: 120/72  Pulse: (!) 55  Temp: (!) 97 F (36.1 C)  TempSrc: Temporal   SpO2: 98%  Weight: 146 lb 6.4 oz (66.4 kg)  Height: '5\' 3"'$  (1.6 m)   Body mass index is 25.93 kg/m.   General: Well developed, well nourished. No acute distress. Psych: Alert and oriented. Normal mood and affect.  There are no preventive care reminders to display for this patient.    Assessment & Plan:   1. [name] [name] is showing some improved tolerance of her visual hallucinations now that she is on quetiapine 25 mg bid. We will continue this for now and monitor its effectiveness over time.  2. Mild cognitive impairment with memory loss We discussed that the goal of Namenda is not necessarily to improve memory, but to slow memory loss. I recommended that Michelle [name] continue this for now at 10 mg daily. She has a pending follow-up with Dr. Leonie [name] in 2 weeks.  Return in about 3 months (around 08/05/2022) for Reassessment.   Michelle Salter, MD

## 2022-05-25 ENCOUNTER — Ambulatory Visit (INDEPENDENT_AMBULATORY_CARE_PROVIDER_SITE_OTHER): Payer: Medicare HMO | Admitting: Neurology

## 2022-05-25 ENCOUNTER — Encounter: Payer: Self-pay | Admitting: Neurology

## 2022-05-25 VITALS — BP 169/83 | HR 67 | Ht 64.0 in | Wt 147.0 lb

## 2022-05-25 DIAGNOSIS — R413 Other amnesia: Secondary | ICD-10-CM | POA: Diagnosis not present

## 2022-05-25 DIAGNOSIS — H5316 Psychophysical visual disturbances: Secondary | ICD-10-CM | POA: Diagnosis not present

## 2022-05-25 DIAGNOSIS — G3184 Mild cognitive impairment, so stated: Secondary | ICD-10-CM | POA: Diagnosis not present

## 2022-05-25 NOTE — Progress Notes (Signed)
[facility] [address] [place]. [place] [address] [phone]       OFFICE FOLLOW-UP VISIT NOTE  Michelle [name] Date of Birth:  [birth year] Medical Record Number:  [id number]   Referring MD: Sander Radon  Reason for Referral: Confusion  HPI: Initial visit 12/09/2020: Michelle [name] is a 81 year old Caucasian lady seen today for initial office consultation visit.  History is obtained from the patient and her husband who is accompanying her today as well as review of electronic medical records and I personally reviewed pertinent available imaging films in PACS.  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  She was recently admitted to [facility] on 12/01/2020 with E. coli bacteremia and at that time was found to have altered mental status with confusion disorientation.  She also had transient weakness and slurred speech and presyncopal symptoms.  She was treated with IV Rocephin and discharged on cefdinir.  CT scan of the head showed incidental finding of a calcified mass in the pineal region.  She felt better on antibiotics and was discharged home.  When her urine culture came back positive for E. coli she was called back to come to the hospital.  She was referred to neurology for evaluation for abnormal CT scan.  An MRI scan of the brain was obtained without contrast on 12/02/2020 which I personally reviewed and shows a heterogeneous calcified T2 hypointense extra-axial mass located posterior and superior to the pineal gland measuring 1.5 into 1 cm with mild mass-effect on the splenium of corpus callosum with a 1.1 cm cyst in the pineal gland itself.  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  The patient and her husband's main complaint today is about her short-term memory loss which she has had for about a year.  She has trouble remembering recent information and forgets how to write things down.  She is aware of functional quite independent and manages her own affairs well.  The patient is also had formed fixed visual hallucinations and sees people's in the room.  This has been attributed to her severe macular degeneration and she has seen ophthalmologist Dr. Zigmund Daniel who is diagnosed her with Michelle [name] syndrome.  Patient is functionally blind and can barely see objects at 2 to 3 feet distance.  She knows the hallucinations are not real and does get a little anxious.  She has been started on Klonopin which seems to help her anxiety.  She denies any headache episodes of prior stroke, TIA, seizures or loss of consciousness.  There is no family history of dementia.  Her cognitive impairment testing was limited today due to her severe visual impairment but the head 3 word recall was diminished at 0/3 and she was able to name only 7 animals which can walk on 4 legs. Update 11/26/2021 : She returns for follow-up after last visit a year ago.  She was supposed to follow-up earlier but appointments were rescheduled a couple of times.  Patient had lab work which I ordered at the last visit and vitamin B12, TSH and RPR were all normal on 11/29/2020.  EEG done on 12/11/2020 showed mild generalized slowing without any epileptiform activity.  Patient was started on Aricept by me which she took for nearly 5 months and  discontinued as they did not notice any benefit.  She was able to tolerate however without side effects.  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  She continues to have very poor vision which is mostly cloudy.  She continues to have complex visual hallucinations in the form of  seeing people or animals.  At times she also she is in forms and patents.  She continues to have poor short-term memory and cognitive difficulties but these do not appear to be significantly progressing.  She is able to ambulate inside her house without with a cane but when going outdoors and in unfamiliar situations she has to hold her husband's hand.  On her limited cognitive testing today due to her visual impairment she had 0/3 on recall and was able to name 11 animals. Update 05/25/2022 : She returns for follow-up after last visit 6 months ago.  Patient states she is tolerating Namenda well without any side effects.  Has noticed improvement in her complex visual hallucinations and these are not as  irritating anymore.  She is not sure if this is a medication visit.  Previous fusion.  She is also noticed improvement in her cognition.  Her vision continues to remains quite poor and she can manage to get around inside of her house wearing glasses.  She has not had any falls or injuries.  She continues to have poor short-term memory and cognitive difficulties since.  Disease improved ROS:   14 system review of systems is positive for poor vision, visual hallucinations, memory loss, gait difficulties, bruising and all other systems negative  PMH:  Past Medical History:  Diagnosis Date   Allergic rhinitis    Cystocele    Fibromuscular dysplasia of renal artery (HCC)    Hypertension    fibromuscular dysplasia   Osteoarthritis    Osteoporosis    PMS (premenstrual syndrome)    Retinal hemorrhage, right    with macular degeneration    Social History:  Social History   Socioeconomic History   Marital status: Married    Spouse name: Charles   Number of children: 3   Years of education: Not on file   Highest education level: Not on file  Occupational History   Not on file  Tobacco Use   Smoking status: Former   Smokeless tobacco: Former    Quit date: 10/27/1983  Vaping Use   Vaping Use:  Never used  Substance and Sexual Activity   Alcohol use: No   Drug use: No   Sexual activity: Yes  Other Topics Concern   Not on file  Social History Narrative   Lives with husband at home   Right Handed   Drinks 4-6 cups caffeine daily      Three living children (lost one child in infancy and another in a MVA in early 8s)   Social Determinants of Health   Financial Resource Strain: Low Risk  (05/27/2021)   Overall Financial Resource Strain (CARDIA)    Difficulty of Paying Living Expenses: Not hard at all  Food Insecurity: No Food Insecurity (05/27/2021)   Hunger Vital Sign    Worried About Running Out of Food in the Last Year: Never true    Ran Out of Food in the Last Year: Never true  Transportation [name]: No Transportation [name] (05/27/2021)   PRAPARE - Hydrologist (Medical): No  Lack of Transportation (Non-Medical): No  Physical Activity: Insufficiently Active (05/27/2021)   Exercise Vital Sign    Days of Exercise per Week: 3 days    Minutes of Exercise per Session: 30 min  Stress: No Stress Concern Present (05/27/2021)   [place]    Feeling of Stress : Not at all  Social Connections: Moderately Isolated (05/27/2021)   Social Connection and Isolation Panel [NHANES]    Frequency of Communication with Friends and Family: More than three times a week    Frequency of Social Gatherings with Friends and Family: Three times a week    Attends Religious Services: Never    Active Member of Clubs or Organizations: No    Attends Archivist Meetings: Never    Marital Status: Married  Human resources officer Violence: Not At Risk (05/27/2021)   Humiliation, Afraid, Rape, and Kick questionnaire    Fear of Current or Ex-Partner: No    Emotionally Abused: No    Physically Abused: No    Sexually Abused: No    Medications:   Current Outpatient Medications on File Prior to Visit   Medication Sig Dispense Refill   Bevacizumab (AVASTIN) 100 MG/4ML SOLN Inject 1-10 mg into the eye See admin instructions. For eyes One every 6 weeks 1/10 mg     furosemide (LASIX) 20 MG tablet Take 20 mg by mouth daily.     ibuprofen (ADVIL) 200 MG tablet Take 200 mg by mouth every 6 (six) hours as needed.     lisinopril (ZESTRIL) 40 MG tablet Take 40 mg by mouth daily.     memantine (NAMENDA) 10 MG tablet Take 10 mg by mouth at bedtime.     omeprazole (PRILOSEC) 20 MG capsule Take 20 mg by mouth daily before breakfast.     QUEtiapine (SEROQUEL) 25 MG tablet TAKE 1 TABLET BY MOUTH TWICE A DAY 180 tablet 2   No current facility-administered medications on file prior to visit.    Allergies:   Allergies  Allergen Reactions   Iodinated Contrast Media Hives    Patient believes this was an older form of iodinated contrast.  Tolerated OMNIPAQUE IOHEXOL 11/19/14 for renal arteriogram/angioplasty without prep. [name], RN   Katharine Look Other (See Comments)    unknown   Diphenhydramine Hcl Hives    Physical Exam General: Frail elderly Caucasian lady, seated, in no evident distress Head: head normocephalic and atraumatic.   Neck: supple with no carotid or supraclavicular bruits Cardiovascular: regular rate and rhythm, no murmurs Musculoskeletal: no deformity Skin:  no rash/petichiae Vascular:  Normal pulses all extremities  Neurologic Exam Mental Status: Awake and fully alert. Oriented to place and time. Recent and remote memory intact. Attention span, concentration and fund of knowledge appropriate. Mood and affect appropriate.  Diminished recall 0/3.  Able to name only 7 animals which can walk on 4 legs.  Mini-Mental status exam could not be completed due to limited  vision.  Diminished 3 word recall 0/3.  Able to name only 7 animals which can walk on 4 legs.  Attempted clock drawing  1/4 but due to poor vision not able to write numbers.  Or copy intersecting pentagons.. Cranial Nerves:  Fundoscopic exam difficult through undilated pupils.. Pupils equal, sluggishly reactive to light.  Diminished vision acuity bilaterally in finger counting limited to only 2 to 3 feet on both sides.  Extraocular movements full without nystagmus. Visual fields cannot be tested due to poor vision. Hearing slightly  diminished bilaterally facial sensation intact. Face, tongue, palate moves normally and symmetrically.  Motor: Normal bulk and tone. Normal strength in all tested extremity muscles. Sensory.: intact to touch , pinprick , position and vibratory sensation.  Coordination: Rapid alternating movements normal in all extremities. Finger-to-nose and heel-to-shin performed accurately bilaterally. Gait and Station: Arises from chair without difficulty. Stance is broad-based. Gait demonstrates mild ataxia.  Unsteady while standing on a narrow base and on either foot unsupported..  Not able to heel, toe and tandem walk .  Reflexes: 1+ and symmetric. Toes downgoing.        11/26/2021    3:09 PM 12/09/2020    3:13 PM  MMSE - Mini Mental State Exam  Not completed: Unable to complete   Orientation to time 4 4  Orientation to Place 5 5  Registration 3 3  Attention/ Calculation 3 1  Recall 0 0  Language- name 2 objects  2  Language- repeat  0  Language- follow 3 step command  3  Language- read & follow direction  1  Write a sentence  1  Copy design  0  Copy design-comments  unable to do/visual limitations  Total score  20     ASSESSMENT: 81 year old Caucasian lady with recent episode of confusion and disorientation in the setting of E. coli urinary tract infection who likely has baseline mild cognitive impairment. 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  She has mild cognitive impairment and complex visual hallucinations from Michelle [name] syndrome related to severe macular.  She has shown some improvement in her  hallucinations and cognition after starting  Namenda     PLAN: I had a long discussion with patient regarding her visual hallucinations and memory loss and cognitive impairment.  Patient seems to be tolerating Namenda quite well without side effects and has obtained some improvement in her visual examination as well as cognition current dose of 10 mg daily.Marland Kitchen  She was also counseled about her different visual hallucinations and not to act on them.  She does have history of Michelle [name] syndrome and visual hallucinations related to her severe bilateral macular degeneration and MRI scan shows a calcified pineal region mass likely a benign calcified pineal tumor/cyst which is unlikely to be of clinical significance and likely [name] no further follow-up Patient was also advised to increase participation in cognitively challenging activities like solving crossword puzzles, playing bridge and sodoku.  We also discussed memory compensation strategies.  She will return for follow-up in the future in 6 months or call earlier if necessary.  Greater than 50% time during this 35-minute n visit were spent on counseling and coordination of care about her episode of confusion, short-term memory difficulties abnormal MRI scan showing calcified pineal lesion and answering questions. Antony Contras, MD Note: This document was prepared with digital dictation and possible smart phrase technology. Any transcriptional errors that result from this process are unintentional.

## 2022-05-25 NOTE — Patient Instructions (Signed)
I had a long discussion with patient regarding her visual hallucinations and memory loss and cognitive impairment.  Patient seems to be tolerating Namenda quite well without side effects and has obtained some improvement in her visual examination as well as cognition current dose of 10 mg daily.Michelle [name]  She was also counseled about her different visual hallucinations and not to act on them.  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  We also discussed memory compensation strategies.  She will return for follow-up in the future in 6 months or call earlier if necessary.

## 2022-06-01 ENCOUNTER — Ambulatory Visit (INDEPENDENT_AMBULATORY_CARE_PROVIDER_SITE_OTHER): Payer: Medicare HMO

## 2022-06-01 VITALS — Ht 63.0 in | Wt 142.0 lb

## 2022-06-01 DIAGNOSIS — Z0000 Encounter for general adult medical examination without abnormal findings: Secondary | ICD-10-CM | POA: Diagnosis not present

## 2022-06-01 NOTE — Patient Instructions (Signed)
Michelle [name] , Thank you for taking time to come for your Medicare Wellness Visit. I appreciate your ongoing commitment to your health goals. Please review the following plan we discussed and let me know if I can assist you in the future.   Screening recommendations/referrals: Colonoscopy: not required Mammogram: not required Bone Density: completed 09/05/2020 Recommended yearly ophthalmology/optometry visit for glaucoma screening and checkup Recommended yearly dental visit for hygiene and checkup  Vaccinations: Influenza vaccine: completed 04/09/2022 Pneumococcal vaccine: completed 05/10/2018 Tdap vaccine: completed 01/23/2013, due 01/24/2023 Shingles vaccine: completed   Covid-19: 04/29/2022, 07/11/2021, 05/07/2020, 09/07/2019, 08/17/2019  Advanced directives: Please bring a copy of your POA (Power of Attorney) and/or Living Will to your next appointment.   Conditions/risks identified: none  Next appointment: Follow up in one year for your annual wellness visit    Preventive Care 65 Years and Older, Female Preventive care refers to lifestyle choices and visits with your health care provider that can promote health and wellness. What does preventive care include? A yearly physical exam. This is also called an annual well check. Dental exams once or twice a year. Routine eye exams. Ask your health care provider how often you should have your eyes checked. Personal lifestyle choices, including: Daily care of your teeth and gums. Regular physical activity. Eating a healthy diet. Avoiding tobacco and drug use. Limiting alcohol use. Practicing safe sex. Taking low-dose aspirin every day. Taking vitamin and mineral supplements as recommended by your health care provider. What happens during an annual well check? The services and screenings done by your health care provider during your annual well check will depend on your age, overall health, lifestyle risk factors, and family history of  disease. Counseling  Your health care provider may ask you questions about your: Alcohol use. Tobacco use. Drug use. Emotional well-being. Home and relationship well-being. Sexual activity. Eating habits. History of falls. Memory and ability to understand (cognition). Work and work Statistician. Reproductive health. Screening  You may have the following tests or measurements: Height, weight, and BMI. Blood pressure. Lipid and cholesterol levels. These may be checked every 5 years, or more frequently if you are over 78 years old. Skin check. Lung cancer screening. You may have this screening every year starting at age 48 if you have a 30-pack-year history of smoking and currently smoke or have quit within the past 15 years. Fecal occult blood test (FOBT) of the stool. You may have this test every year starting at age 61. Flexible sigmoidoscopy or colonoscopy. You may have a sigmoidoscopy every 5 years or a colonoscopy every 10 years starting at age 47. Hepatitis C blood test. Hepatitis B blood test. Sexually transmitted disease (STD) testing. Diabetes screening. This is done by checking your blood sugar (glucose) after you have not eaten for a while (fasting). You may have this done every 1-3 years. Bone density scan. This is done to screen for osteoporosis. You may have this done starting at age 85. Mammogram. This may be done every 1-2 years. Talk to your health care provider about how often you should have regular mammograms. Talk with your health care provider about your test results, treatment options, and if necessary, the need for more tests. Vaccines  Your health care provider may recommend certain vaccines, such as: Influenza vaccine. This is recommended every year. Tetanus, diphtheria, and acellular pertussis (Tdap, Td) vaccine. You may need a Td booster every 10 years. Zoster vaccine. You may need this after age 34. Pneumococcal 13-valent conjugate (PCV13) vaccine.  One  dose is recommended after age 6. Pneumococcal polysaccharide (PPSV23) vaccine. One dose is recommended after age 77. Talk to your health care provider about which screenings and vaccines you need and how often you need them. This information is not intended to replace advice given to you by your health care provider. Make sure you discuss any questions you have with your health care provider. Document Released: 08/09/2015 Document Revised: 04/01/2016 Document Reviewed: 05/14/2015 Elsevier Interactive Patient Education  2017 [place] Prevention in the Home Falls can cause injuries. They can happen to people of all ages. There are many things you can do to make your home safe and to help prevent falls. What can I do on the outside of my home? Regularly fix the edges of walkways and driveways and fix any cracks. Remove anything that might make you trip as you walk through a door, such as a raised step or threshold. Trim any bushes or trees on the path to your home. Use bright outdoor lighting. Clear any walking paths of anything that might make someone trip, such as rocks or tools. Regularly check to see if handrails are loose or broken. Make sure that both sides of any steps have handrails. Any raised decks and porches should have guardrails on the edges. Have any leaves, snow, or ice cleared regularly. Use sand or salt on walking paths during winter. Clean up any spills in your garage right away. This includes oil or grease spills. What can I do in the bathroom? Use night lights. Install grab bars by the toilet and in the tub and shower. Do not use towel bars as grab bars. Use non-skid mats or decals in the tub or shower. If you need to sit down in the shower, use a plastic, non-slip stool. Keep the floor dry. Clean up any water that spills on the floor as soon as it happens. Remove soap buildup in the tub or shower regularly. Attach bath mats securely with double-sided  non-slip rug tape. Do not have throw rugs and other things on the floor that can make you trip. What can I do in the bedroom? Use night lights. Make sure that you have a light by your bed that is easy to reach. Do not use any sheets or blankets that are too big for your bed. They should not hang down onto the floor. Have a firm chair that has side arms. You can use this for support while you get dressed. Do not have throw rugs and other things on the floor that can make you trip. What can I do in the kitchen? Clean up any spills right away. Avoid walking on wet floors. Keep items that you use a lot in easy-to-reach places. If you need to reach something above you, use a strong step stool that has a grab bar. Keep electrical cords out of the way. Do not use floor polish or wax that makes floors slippery. If you must use wax, use non-skid floor wax. Do not have throw rugs and other things on the floor that can make you trip. What can I do with my stairs? Do not leave any items on the stairs. Make sure that there are handrails on both sides of the stairs and use them. Fix handrails that are broken or loose. Make sure that handrails are as long as the stairways. Check any carpeting to make sure that it is firmly attached to the stairs. Fix any carpet that is loose or  worn. Avoid having throw rugs at the top or bottom of the stairs. If you do have throw rugs, attach them to the floor with carpet tape. Make sure that you have a light switch at the top of the stairs and the bottom of the stairs. If you do not have them, ask someone to add them for you. What else can I do to help prevent falls? Wear shoes that: Do not have high heels. Have rubber bottoms. Are comfortable and fit you well. Are closed at the toe. Do not wear sandals. If you use a stepladder: Make sure that it is fully opened. Do not climb a closed stepladder. Make sure that both sides of the stepladder are locked into place. Ask  someone to hold it for you, if possible. Clearly mark and make sure that you can see: Any grab bars or handrails. First and last steps. Where the edge of each step is. Use tools that help you move around (mobility aids) if they are needed. These include: Canes. Walkers. Scooters. Crutches. Turn on the lights when you go into a dark area. Replace any light bulbs as soon as they burn out. Set up your furniture so you have a clear path. Avoid moving your furniture around. If any of your floors are uneven, fix them. If there are any pets around you, be aware of where they are. Review your medicines with your doctor. Some medicines can make you feel dizzy. This can increase your chance of falling. Ask your doctor what other things that you can do to help prevent falls. This information is not intended to replace advice given to you by your health care provider. Make sure you discuss any questions you have with your health care provider. Document Released: 05/09/2009 Document Revised: 12/19/2015 Document Reviewed: 08/17/2014 Elsevier Interactive Patient Education  2017 [employer].

## 2022-06-01 NOTE — Progress Notes (Signed)
I connected with Michelle [name] today by telephone and verified that I am speaking with the correct person using two identifiers. Location patient: home Location provider: work Persons participating in the virtual visit: Michelle [name], [name] LPN.   I discussed the limitations, risks, security and privacy concerns of performing an evaluation and management service by telephone and the availability of in person appointments. I also discussed with the patient that there may be a patient responsible charge related to this service. The patient expressed understanding and verbally consented to this telephonic visit.    Interactive audio and video telecommunications were attempted between this provider and patient, however failed, due to patient having technical difficulties OR patient did not have access to video capability.  We continued and completed visit with audio only.     Vital signs may be patient reported or missing.  Subjective:   Michelle [name] is a 81 y.o. female who presents for Medicare Annual (Subsequent) preventive examination.  Review of Systems     Cardiac Risk Factors include: advanced age (>35mn, >>29women);hypertension     Objective:    Today's Vitals   06/01/22 1056  Weight: 142 lb (64.4 kg)  Height: '5\' 3"'$  (1.6 m)   Body mass index is 25.15 kg/m.     06/01/2022   11:01 AM 08/06/2021   11:00 PM 08/06/2021   11:29 AM 05/27/2021   10:00 AM 12/01/2020    8:25 PM 11/30/2020    5:17 PM 05/21/2020    9:04 AM  Advanced Directives  Does Patient Have a Medical Advance Directive? Yes  Yes No No No Yes  Type of AParamedicof A[place]Living will [place]axap[place]Living will H[place]aticketLiving will    HEl [place] will  Copy of [place] Chart? No - copy requested No - copy requested     No - copy requested  Would patient like information on creating a  medical advance directive?  No - Guardian declined  No - Patient declined No - Patient declined No - Patient declined     Current Medications (verified) Outpatient Encounter Medications as of 06/01/2022  Medication Sig   Bevacizumab (AVASTIN) 100 MG/4ML SOLN Inject 1-10 mg into the eye See admin instructions. For eyes One every 6 weeks 1/10 mg   furosemide (LASIX) 20 MG tablet Take 20 mg by mouth daily.   ibuprofen (ADVIL) 200 MG tablet Take 200 mg by mouth every 6 (six) hours as needed.   lisinopril (ZESTRIL) 40 MG tablet Take 40 mg by mouth daily.   memantine (NAMENDA) 10 MG tablet Take 10 mg by mouth at bedtime.   omeprazole (PRILOSEC) 20 MG capsule Take 20 mg by mouth daily before breakfast.   QUEtiapine (SEROQUEL) 25 MG tablet TAKE 1 TABLET BY MOUTH TWICE A DAY   No facility-administered encounter medications on file as of 06/01/2022.    Allergies (verified) Iodinated contrast media, [place], and Diphenhydramine hcl   History: Past Medical History:  Diagnosis Date   Allergic rhinitis    Cystocele    Fibromuscular dysplasia of renal artery (HCC)    Hypertension    fibromuscular dysplasia   Osteoarthritis    Osteoporosis    PMS (premenstrual syndrome)    Retinal hemorrhage, right    with macular degeneration   Past Surgical History:  Procedure Laterality Date   ANGIOPLASTY     right renal 1981   BTL  cb x 3     HEMORRHOID SURGERY     IR PTA RENAL VISCERAL  05/11/2017   IR RADIOLOGIST EVAL & MGMT  04/21/2017   IR RENAL BILAT S&I MOD SED  05/11/2017   IR US GUIDE VASC ACCESS RIGHT  05/11/2017   Rt ovarian cyst removed     TONSILLECTOMY AND ADENOIDECTOMY     Family History  Problem Relation Age of Onset   Diabetes Mother    Hypertension Mother    Stroke Brother    Diabetes Maternal Aunt    Social History   Socioeconomic History   Marital status: Married    Spouse name: [name]   Number of children: 3   Years of education: Not on file   Highest education  level: Not on file  Occupational History   Not on file  Tobacco Use   Smoking status: Former   Smokeless tobacco: Former    Quit date: 10/27/1983  Vaping Use   Vaping Use: Never used  Substance and Sexual Activity   Alcohol use: No   Drug use: No   Sexual activity: Yes  Other Topics Concern   Not on file  Social History Narrative   Lives with husband at home   Right Handed   Drinks 4-6 cups caffeine daily      Three living children (lost one child in infancy and another in a MVA in early 70s)   Social Determinants of Health   Financial Resource Strain: Low Risk  (06/01/2022)   Overall Financial Resource Strain (CARDIA)    Difficulty of Paying Living Expenses: Not hard at all  Food Insecurity: No Food Insecurity (06/01/2022)   Hunger Vital Sign    Worried About Running Out of Food in the Last Year: Never true    [place] in the Last Year: Never true  Transportation Needs: No Transportation Needs (06/01/2022)   PRAPARE - Hydrologist (Medical): No    Lack of Transportation (Non-Medical): No  Physical Activity: Inactive (06/01/2022)   Exercise Vital Sign    Days of Exercise per Week: 0 days    Minutes of Exercise per Session: 0 min  Stress: No Stress Concern Present (06/01/2022)   [place]    Feeling of Stress : Not at all  Social Connections: Moderately Isolated (05/27/2021)   Social Connection and Isolation Panel [NHANES]    Frequency of Communication with Friends and Family: More than three times a week    Frequency of Social Gatherings with Friends and Family: Three times a week    Attends Religious Services: Never    Active Member of Clubs or Organizations: No    Attends Music therapist: Never    Marital Status: Married    Tobacco Counseling Counseling given: Not Answered   Clinical Intake:  Pre-visit preparation completed: Yes  Pain : No/denies  pain     Nutritional Status: BMI 25 -29 Overweight Nutritional Risks: None Diabetes: No  How often do you need to have someone help you when you read instructions, pamphlets, or other written materials from your doctor or pharmacy?: 5 - Always  Diabetic? no  Interpreter Needed?: No  Information entered by :: NAllen LPN   Activities of Daily Living    06/01/2022   11:03 AM 05/28/2022   10:08 AM  In your present state of health, do you have any difficulty performing the following activities:  Hearing? 0  0  Vision? 1 1  Difficulty concentrating or making decisions? 1 1  Walking or climbing stairs? 1 1  Dressing or bathing? 0 0  Doing errands, shopping? 1 1  Preparing Food and eating ? Y Y  Using the Toilet? N N  In the past six months, have you accidently leaked urine? Y Y  Do you have problems with loss of bowel control? N N  Managing your Medications? Y Y  Managing your Finances? Tempie Donning  Housekeeping or managing your Housekeeping? Tempie Donning    Patient Care Team: Haydee Salter, MD as PCP - General (Family Medicine) Donato Heinz, MD as Consulting Physician (Nephrology) Hayden Pedro, MD as Consulting Physician (Ophthalmology) Garvin Fila, MD as Consulting Physician (Neurology)  Indicate any recent Medical Services you may have received from other than [facility] providers in the past year (date may be approximate).     Assessment:   This is a routine wellness examination for [place].  Hearing/Vision screen Vision Screening - Comments:: Regular eye exams, Dr. Zigmund Daniel  Dietary issues and exercise activities discussed: Current Exercise Habits: The patient does not participate in regular exercise at present   Goals Addressed             This Visit's Progress    Patient Stated       06/01/2022, survive       Depression Screen    06/01/2022   11:03 AM 11/07/2021    9:57 AM 05/27/2021   10:00 AM 03/11/2021    1:23 PM 05/21/2020    9:06 AM 03/15/2020     8:06 AM 08/11/2018    9:08 AM  PHQ 2/9 Scores  PHQ - 2 Score 0 1 0 4 0 0 0  PHQ- 9 Score  2  14       Fall Risk    06/01/2022   11:02 AM 05/28/2022   10:08 AM 05/27/2021   10:00 AM 05/21/2020    9:05 AM 03/15/2020    8:06 AM  Fall Risk   Falls in the past year? 1 0 0 0 0  Comment tripped      Number falls in past yr: 0 1 0 0 0  Injury with Fall? 0 0 0 0 0  Risk for fall due to : Impaired vision;Medication side effect;Impaired mobility;Impaired balance/gait      Follow up Falls evaluation completed;Education provided;Falls prevention discussed  Falls evaluation completed;Falls prevention discussed Falls prevention discussed     FALL RISK PREVENTION PERTAINING TO THE HOME:  Any stairs in or around the home? Yes  If so, are there any without handrails? No  Home free of loose throw rugs in walkways, pet beds, electrical cords, etc? Yes  Adequate lighting in your home to reduce risk of falls? Yes   ASSISTIVE DEVICES UTILIZED TO PREVENT FALLS:  Life alert? No  Use of a cane, walker or w/c? Yes  Grab bars in the bathroom? No  Shower chair or bench in shower? No  Elevated toilet seat or a handicapped toilet? Yes   TIMED UP AND GO:  Was the test performed? No .      Cognitive Function:    11/26/2021    3:09 PM 12/09/2020    3:13 PM  MMSE - Mini Mental State Exam  Not completed: Unable to complete   Orientation to time 4 4  Orientation to Place 5 5  Registration 3 3  Attention/ Calculation 3 1  Recall 0 0  Language-  name 2 objects  2  Language- repeat  0  Language- follow 3 step command  3  Language- read & follow direction  1  Write a sentence  1  Copy design  0  Copy design-comments  unable to do/visual limitations  Total score  20        05/21/2020    9:11 AM  6CIT Screen  What Year? 4 points  What month? 0 points  What time? 0 points  Count back from 20 0 points  Months in reverse 0 points  Repeat phrase 0 points  Total Score 4 points     Immunizations Immunization History  Administered Date(s) Administered   DTaP 05/09/2014   Fluad Quad(high Dose 65+) 04/05/2019, 04/09/2022   Influenza Split 05/08/2013   Influenza Whole 04/26/2009   Influenza, High Dose Seasonal PF 05/21/2014, 03/24/2016, 03/26/2021   Influenza, Quadrivalent, Recombinant, Inj, Pf 05/04/2017, 05/10/2018   Influenza-Unspecified 05/07/2020   PFIZER Comirnaty(Gray Top)Covid-19 Tri-Sucrose Vaccine 04/29/2022   PFIZER(Purple Top)SARS-COV-2 Vaccination 08/17/2019, 09/07/2019, 05/07/2020   Pfizer Covid-19 Vaccine Bivalent Booster 46yr & up 07/11/2021   Pneumococcal Conjugate-13 11/01/2013, 05/10/2018   Pneumococcal Polysaccharide-23 07/28/2003, 10/23/2008, 08/08/2013   Td 07/27/2002   Tdap 01/23/2013   Zoster Recombinat (Shingrix) 01/27/2021, 08/19/2021   Zoster, Live 01/23/2013, 05/08/2014    TDAP status: Up to date  Flu Vaccine status: Up to date  Pneumococcal vaccine status: Up to date  Covid-19 vaccine status: Completed vaccines  Qualifies for Shingles Vaccine? Yes   Zostavax completed Yes   Shingrix Completed?: Yes  Screening Tests Health Maintenance  Topic Date Due   Medicare Annual Wellness (AWV)  05/27/2022   COVID-19 Vaccine (6 - Pfizer risk series) 06/24/2022   TETANUS/TDAP  01/24/2023   Pneumonia Vaccine 81 Years old  Completed   INFLUENZA VACCINE  Completed   DEXA SCAN  Completed   Zoster Vaccines- Shingrix  Completed   HPV VACCINES  Aged Out    Health Maintenance  Health Maintenance Due  Topic Date Due   Medicare Annual Wellness (AWV)  05/27/2022    Colorectal cancer screening: No longer required.   Mammogram status: Completed 11/12/2021. Repeat every year  Bone Density status: Completed 09/05/2020.   Lung Cancer Screening: (Low Dose CT Chest recommended if Age 83-80years, 30 pack-year currently smoking OR have quit w/in 15years.) does not qualify.   Lung Cancer Screening Referral: no  Additional  Screening:  Hepatitis C Screening: does not qualify;   Vision Screening: Recommended annual ophthalmology exams for early detection of glaucoma and other disorders of the eye. Is the patient up to date with their annual eye exam?  Yes  Who is the provider or what is the name of the office in which the patient attends annual eye exams? Dr. MZigmund DanielIf pt is not established with a provider, would they like to be referred to a provider to establish care? No .   Dental Screening: Recommended annual dental exams for proper oral hygiene  Community Resource Referral / Chronic Care Management: CRR required this visit?  No   CCM required this visit?  No      Plan:     I have personally reviewed and noted the following in the patient's chart:   Medical and social history Use of alcohol, tobacco or illicit drugs  Current medications and supplements including opioid prescriptions. Patient is not currently taking opioid prescriptions. Functional ability and status Nutritional status Physical activity Advanced directives List of other physicians Hospitalizations, surgeries, and ER  visits in previous 12 months Vitals Screenings to include cognitive, depression, and falls Referrals and appointments  In addition, I have reviewed and discussed with patient certain preventive protocols, quality metrics, and best practice recommendations. A written personalized care plan for preventive services as well as general preventive health recommendations were provided to patient.     Kellie Simmering, LPN   [id number]   Nurse Notes: 6 CIT not administered. Patient has diagnosis of cognitive impairment. She is followed by neurology.   Due to this being a virtual visit, the after visit summary with patients personalized plan was offered to patient via mail or my-chart. Patient would like to access on my-chart

## 2022-06-03 ENCOUNTER — Encounter (INDEPENDENT_AMBULATORY_CARE_PROVIDER_SITE_OTHER): Payer: Medicare HMO | Admitting: Ophthalmology

## 2022-06-03 DIAGNOSIS — I10 Essential (primary) hypertension: Secondary | ICD-10-CM | POA: Diagnosis not present

## 2022-06-03 DIAGNOSIS — H353124 Nonexudative age-related macular degeneration, left eye, advanced atrophic with subfoveal involvement: Secondary | ICD-10-CM

## 2022-06-03 DIAGNOSIS — H43813 Vitreous degeneration, bilateral: Secondary | ICD-10-CM

## 2022-06-03 DIAGNOSIS — H35033 Hypertensive retinopathy, bilateral: Secondary | ICD-10-CM | POA: Diagnosis not present

## 2022-06-03 DIAGNOSIS — H353211 Exudative age-related macular degeneration, right eye, with active choroidal neovascularization: Secondary | ICD-10-CM

## 2022-07-05 ENCOUNTER — Encounter: Payer: Self-pay | Admitting: Family Medicine

## 2022-07-05 DIAGNOSIS — H3530 Unspecified macular degeneration: Secondary | ICD-10-CM

## 2022-07-05 DIAGNOSIS — G3184 Mild cognitive impairment, so stated: Secondary | ICD-10-CM

## 2022-07-05 DIAGNOSIS — H5316 Psychophysical visual disturbances: Secondary | ICD-10-CM

## 2022-07-06 ENCOUNTER — Other Ambulatory Visit: Payer: Self-pay | Admitting: Family Medicine

## 2022-07-06 DIAGNOSIS — H3530 Unspecified macular degeneration: Secondary | ICD-10-CM

## 2022-07-06 DIAGNOSIS — H5316 Psychophysical visual disturbances: Secondary | ICD-10-CM

## 2022-07-06 DIAGNOSIS — G3184 Mild cognitive impairment, so stated: Secondary | ICD-10-CM

## 2022-07-17 ENCOUNTER — Other Ambulatory Visit: Payer: Self-pay | Admitting: Family Medicine

## 2022-07-17 DIAGNOSIS — G3184 Mild cognitive impairment, so stated: Secondary | ICD-10-CM

## 2022-07-29 ENCOUNTER — Encounter: Payer: Self-pay | Admitting: Family Medicine

## 2022-07-30 NOTE — Telephone Encounter (Signed)
Spoke to [place], patient's husband, he is okay and will wait till the appointment on 1/10 to discuss this.  No further questions. Dm/cma

## 2022-08-05 ENCOUNTER — Encounter: Payer: Self-pay | Admitting: Family Medicine

## 2022-08-05 ENCOUNTER — Ambulatory Visit (INDEPENDENT_AMBULATORY_CARE_PROVIDER_SITE_OTHER): Payer: Medicare HMO | Admitting: Family Medicine

## 2022-08-05 VITALS — BP 100/68 | HR 57 | Temp 96.5°F | Ht 63.6 in | Wt 149.4 lb

## 2022-08-05 DIAGNOSIS — F02A0 Dementia in other diseases classified elsewhere, mild, without behavioral disturbance, psychotic disturbance, mood disturbance, and anxiety: Secondary | ICD-10-CM

## 2022-08-05 DIAGNOSIS — D649 Anemia, unspecified: Secondary | ICD-10-CM | POA: Diagnosis not present

## 2022-08-05 DIAGNOSIS — H3530 Unspecified macular degeneration: Secondary | ICD-10-CM | POA: Diagnosis not present

## 2022-08-05 DIAGNOSIS — I10 Essential (primary) hypertension: Secondary | ICD-10-CM

## 2022-08-05 DIAGNOSIS — G309 Alzheimer's disease, unspecified: Secondary | ICD-10-CM

## 2022-08-05 DIAGNOSIS — N1831 Chronic kidney disease, stage 3a: Secondary | ICD-10-CM | POA: Diagnosis not present

## 2022-08-05 DIAGNOSIS — H5316 Psychophysical visual disturbances: Secondary | ICD-10-CM

## 2022-08-05 LAB — CBC
HCT: 41.4 % (ref 36.0–46.0)
Hemoglobin: 14.1 g/dL (ref 12.0–15.0)
MCHC: 34.1 g/dL (ref 30.0–36.0)
MCV: 89.3 fl (ref 78.0–100.0)
Platelets: 200 10*3/uL (ref 150.0–400.0)
RBC: 4.64 Mil/uL (ref 3.87–5.11)
RDW: 12.9 % (ref 11.5–15.5)
WBC: 4.2 10*3/uL (ref 4.0–10.5)

## 2022-08-05 LAB — BASIC METABOLIC PANEL
BUN: 16 mg/dL (ref 6–23)
CO2: 29 mEq/L (ref 19–32)
Calcium: 9.9 mg/dL (ref 8.4–10.5)
Chloride: 106 mEq/L (ref 96–112)
Creatinine, Ser: 1.14 mg/dL (ref 0.40–1.20)
GFR: 44.49 mL/min — ABNORMAL LOW (ref >60.00)
Glucose, Bld: 99 mg/dL (ref 70–99)
Potassium: 3.7 mEq/L (ref 3.5–5.1)
Sodium: 146 mEq/L — ABNORMAL HIGH (ref 135–145)

## 2022-08-05 LAB — TSH: TSH: 2.37 u[IU]/mL (ref 0.35–5.50)

## 2022-08-05 LAB — VITAMIN B12: Vitamin B-12: 280 pg/mL (ref 211–911)

## 2022-08-05 NOTE — Progress Notes (Signed)
[facility] [address] [place] [address] Dept: [phone] Dept Fax: [phone]  Chronic Care Office Visit  Subjective:    Patient ID: Michelle [name], female    DOB: 02/15/1941, 82 y.o..   MRN: [id number]  Chief Complaint  Patient presents with   Follow-up    3 mo follow up. No concerns.    History of Present Illness:  Patient is in today for reassessment of chronic medical issues.  Michelle [name] has a history of macular degeneration with visual loss. She has received bevacizumab (Avastin) injections for this. At her last visit with Dr. Zigmund Daniel, she was noted to no longer have evidence of wet macular degeneration (it was all apparently dry now).The vision loss has resulted in [place], in which she has visual hallucinations. Michelle [name] keeps the shades/curtains drawn during the day, as bright light seems to make the hallucinations more apparent. When she is distracted, these are not as apparent. She had been having issues with distress and anxiety related to the hallucinations. I started her on quetiapine 25 mg bid, which has made an improvement for her.   Michelle [name] has a history of mild cognitive impairment, identified by [name] (neurology). He has been managing Michelle [name] with donepezil (Aricept) 5 mg qhs and memantine (Namenda) 10 mg qhs. Michelle [name] husband relates that she has episodes in the evenings of disorientation to place and time, and often doesn't recognize who he is. This lasts for about 4 hours. Michelle [name] can still care for her basic ADLs. Her dependence on her husband's help for her IADLs is more related to her vision loss. She has two children, one living in [place] and the other in [place].  Mr. Ransdell notes they have good neighbors that have helped out when they find Michelle [name] wandering in the yard. The Snook's express fear about the progression of dementia for Michelle [name]. They also are concerned with  the care they are receiving form [name], so request to see a different neurologist.  Michelle [name] does admit that they have discussed whether she might need future placement in a setting where she can have more support and to maintain safety. She understands this could happen and is agreeable when the time comes. We discussed enrolling her in chronic care management to try and support her and her husband more.  Michelle [name] has a history fo hypertension. She is currently managed on lisinopril 40 mg daily and furosemide 20 mg daily. She occasionally has some orthostatic symptoms. She does not have any heart failure or swelling in her ankles. She and her husband are hesitant about reduce or stopping the furosemide, noting in the past, her BP surged without this.  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[place]) 08/11/2018   Macular degeneration of both eyes 08/11/2018   Essential hypertension 07/03/2014   History of renal artery stenosis 06/14/2014   Squamous cell carcinoma in situ of skin 02/08/2013   Dysplastic nevus of trunk 01/23/2013   Contact dermatitis and eczema 10/31/2012   Osteopenia 09/29/2007   Allergic rhinitis 02/24/2007  Generalized OA 02/24/2007   Past Surgical History:  Procedure Laterality Date   ANGIOPLASTY     right renal 1981   BTL     cb x 3     HEMORRHOID SURGERY     IR PTA RENAL VISCERAL  05/11/2017   IR RADIOLOGIST EVAL & MGMT  04/21/2017   IR RENAL BILAT S&I MOD SED  05/11/2017   IR US GUIDE VASC ACCESS RIGHT  05/11/2017   Rt ovarian cyst removed     TONSILLECTOMY AND  ADENOIDECTOMY     Family History  Problem Relation Age of Onset   Diabetes Mother    Hypertension Mother    Stroke Brother    Diabetes Maternal Aunt    Outpatient Medications Prior to Visit  Medication Sig Dispense Refill   Bevacizumab (AVASTIN) 100 MG/4ML SOLN Inject 1-10 mg into the eye See admin instructions. For eyes One every 6 weeks 1/10 mg     donepezil (ARICEPT) 5 MG tablet TAKE 1 TABLET ('5MG'$  TOTAL)  AT BEDTIME 90 tablet 3   furosemide (LASIX) 20 MG tablet Take 20 mg by mouth daily.     ibuprofen (ADVIL) 200 MG tablet Take 200 mg by mouth every 6 (six) hours as needed.     lisinopril (ZESTRIL) 40 MG tablet Take 40 mg by mouth daily.     memantine (NAMENDA) 10 MG tablet Take 10 mg by mouth at bedtime.     omeprazole (PRILOSEC) 20 MG capsule Take 20 mg by mouth daily before breakfast.     QUEtiapine (SEROQUEL) 25 MG tablet TAKE 1 TABLET BY MOUTH TWICE A DAY 180 tablet 2   No facility-administered medications prior to visit.   Allergies  Allergen Reactions   Iodinated Contrast Media Hives    Patient believes this was an older form of iodinated contrast.  Tolerated OMNIPAQUE IOHEXOL 11/19/14 for renal arteriogram/angioplasty without prep. [name], RN   Katharine Look Other (See Comments)    unknown   Diphenhydramine Hcl Hives     Objective:   Today's Vitals   08/05/22 0919  BP: 100/68  Pulse: (!) 57  Temp: (!) 96.5 F (35.8 C)  TempSrc: Temporal  SpO2: 95%  Weight: 149 lb 6.4 oz (67.8 kg)  Height: 5' 3.6" (1.615 m)   Body mass index is 25.97 kg/m.   General: Well developed, well nourished. No acute distress. Extremities: No edema in lower legs. Psych: Alert and oriented. Normal mood and affect.  Health Maintenance Due  Topic Date Due   COVID-19 Vaccine (6 - 2023-24 season) 06/24/2022     Assessment & Plan:   1. Essential hypertension Blood pressure is in good control at present. I am concerned that she may be hypotensive at times. I will engaged with CCM to help  monitor her blood pressure and make sure she is not over medicated.  - AMB Referral to Chronic Care Management Services - Basic metabolic panel  2. Macular degeneration of both eyes, unspecified type Apparently stable without need for further injections at this point.  3. Chronic kidney disease, stage 3a ([place]) I will reassess her kidney function today. Ms. Allmon has a past history of CKD, having had renal artery stenosis early in life. Continue to manage blood pressure and monitor this.  - AMB Referral to Chronic Care Management Services - Basic metabolic panel  4. [name] Visual hallucinations continue to be an issue. Ms. Sorrels has done much better on quetiapine, so will continue 25 mg bid.  -  AMB Referral to Chronic Care Management Services - Ambulatory referral to Neurology  5. 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 Amrhein's memory has been intact, she is having episodes of disorientation and not knowing who her husband is. I feel she has progressed from mild cognitive impairment to actual dementia. I will refer her to a different neurologist as requested. I do recommend she continue her donepezil 5 mg daily and memantine 10 mg daily for now.  - AMB Referral to Chronic Care Management Services - TSH - Vitamin B12 - Ambulatory referral to Neurology  6. Normocytic anemia I will reassess her anemia today.  - CBC   Return in about 4 months (around 12/04/2022) for Reassessment.   Haydee Salter, MD

## 2022-08-24 ENCOUNTER — Other Ambulatory Visit: Payer: Medicare HMO

## 2022-08-24 NOTE — Progress Notes (Signed)
08/24/2022 Name: Michelle [name] MRN: [id number] DOB: 08/16/1940  Chief Complaint  Patient presents with   Medication Management    Michelle [name] is a 82 y.o. year old female who presented for a telephone visit.   They were referred to the pharmacist by their PCP for assistance in managing hypertension.   Patient is participating in a Managed Medicaid Plan:  No  Subjective: Referral from PCP regarding hypertension management. Reporting regular occurrence of orthostatic hypotension.  Does not endorse swelling or edema. Care Team: Primary Care Provider: Haydee Salter, MD ; Next Scheduled Visit: 12/04/22  Medication Access/Adherence  Current Pharmacy:  [id number] [place] [place] [address]Phone: [phone]: [phone] [facility] [place] [facility] [id number]: [phone]: [phone]  Patient reports affordability concerns with their medications: No  Patient reports access/transportation concerns to their pharmacy: No  Patient reports adherence concerns with their medications:  No     Hypertension:  Current medications: Lisinopril '40mg'$  daily, Furosemide '20mg'$  daily  Patient has a validated, automated, upper arm home BP cuff Current blood pressure readings readings: 98/51, 125/59, 96/46, 101/52  Patient reports hypotensive s/sx including dizziness, lightheadedness.  Patient denies hypertensive symptoms including headache, chest pain, shortness of breath  Objective:  Lab Results  Component Value Date   HGBA1C 5.3 09/14/2006   Lab Results  Component Value Date   CREATININE 1.14 08/05/2022   BUN 16 08/05/2022   NA 146 (H) 08/05/2022   K 3.7 08/05/2022   CL 106 08/05/2022   CO2 29 08/05/2022   Lab Results  Component Value Date   CHOL 174 03/15/2020   HDL  51.30 03/15/2020   LDLCALC 95 03/15/2020   LDLDIRECT 106.0 10/27/2010   TRIG 141.0 03/15/2020   CHOLHDL 3 03/15/2020   Medications Reviewed Today     Reviewed by GDarlina Guys RPH (Pharmacist) on 08/24/22 at 1016  Med List Status: <None>   Medication Order Taking? Sig Documenting Provider Last Dose Status Informant  acetaminophen (TYLENOL) 500 MG tablet [id number] Take 1,000 mg by mouth at bedtime. [provider] Taking Active   Bevacizumab (AVASTIN) 100 MG/4ML SOLN [id number] Inject 1-10 mg into the eye See admin instructions. For eyes One every 6 weeks 1/10 mg  Patient not taking: Reported on 08/24/2022   [provider] Not Taking Active Spouse/Significant Other           Med Note (Colin Rhein Palmina Clodfelter A   Mon Aug 24, 2022 10:13 AM) Has not had to have an injection in the past year  donepezil (ARICEPT) 5 MG tablet [id number] TAKE 1 TABLET ('5MG'$  TOTAL)  AT BEDTIME  Patient not taking: Reported on 08/24/2022   RHaydee Salter MD Not Taking Active   furosemide (LASIX) 20 MG tablet [id number] Take 20 mg by mouth daily. [provider] Taking Active   ibuprofen (ADVIL) 200 MG tablet [id number] Take 200 mg by mouth every 6 (six) hours as needed.  Patient not taking: Reported on 08/24/2022   [provider] Not Taking Active            Med Note (Colin Rhein Mercades Bajaj A   Mon Aug 24, 2022 10:15 AM) No longer taking  lisinopril (ZESTRIL) 40 MG tablet [id number] Take 40 mg by mouth daily. [provider] Taking Active   memantine (NAMENDA) 10 MG tablet [id number] Yes Take 10 mg by mouth at bedtime. [provider] Taking Active   omeprazole (PRILOSEC) 20 MG capsule [id number] Yes Take 20 mg by mouth daily before breakfast. [provider] Taking Active Spouse/Significant Other  QUEtiapine (SEROQUEL) 25 MG tablet [id number] Yes TAKE 1 TABLET BY MOUTH TWICE A DAY Rudd, Lillette Boxer, MD Taking Active             Assessment/Plan:    Hypertension: - Currently controlled with regular occurrence of hypotension - Reviewed long term cardiovascular and renal outcomes of uncontrolled blood pressure - Reviewed appropriate blood pressure monitoring technique and reviewed goal blood pressure. Recommended to check home blood pressure and heart rate every day. - Recommend to discontinue furosemide '20mg'$  daily in presence of hypotension in geriatric population and lack of swelling/edema.  Follow Up Plan: In light of stopping daily furosemide, Mr & Mrs Gassner were educated to monitor and record BP daily as well as any swelling/edema.  Follow-up telephone appointment scheduled for 09/07/22.  Darlina Guys, PharmD, DPLA

## 2022-08-27 ENCOUNTER — Other Ambulatory Visit: Payer: Self-pay

## 2022-08-27 ENCOUNTER — Telehealth: Payer: Self-pay

## 2022-08-27 NOTE — Progress Notes (Signed)
   08/27/2022  Patient ID: Michelle [name], female   DOB: Apr 05, 1941, 82 y.o.   MRN: [id number]  Subjective: Reached out to inform Michelle [name], that Michelle [name] is not be taking donepezil now and also check in on recent BP readings  Objective: BP evening of 08/26/32 was 128/64, and patient still has been taking furosemide '20mg'$  due to concern of BP becoming too elevated if stopping  Assessment/Plan:  I already have follow-up scheduled for 2/12, so I instructed to continue on furosemide '20mg'$  and take BP daily.  Michelle [name] will have values for [place] to discuss at follow-up appointment.  Michelle [name], PharmD, DPLA

## 2022-09-07 ENCOUNTER — Other Ambulatory Visit: Payer: Medicare HMO

## 2022-09-07 NOTE — Progress Notes (Signed)
   09/07/2022  Patient ID: Michelle [name], female   DOB: 1941-07-15, 82 y.o.   MRN: [id number]  Subjective/Objective: Telephone visit to follow-up on blood pressure control.  Patient was previously advised to discontinue furosemide but did not due to concern of high BP.  Recent home readings include: 128/664, 130/56, 84/38, 98/48, 108/47, 127/64, 138/66, 115/60, 124/58, 110/50  Husband and patient endorse s/sx of hypotension during bolded readings, stating they thought they were going to need to go the ED.  Do not endorse any variances in food, activity, medication adherence.  Assessment/Plan: -Furosemide tablets are too small to split to half dose, and patient/husband endorse fluid retention if she does not take -They have lisinopril '20mg'$  tablets and will take these instead of lisinopril '40mg'$  for the next week -Will record BP 1-2 times daily -F/u telephone visit scheduled for 2/19 at 10am to evaluate BP on decreased lisinopril dose -Patient has follow-up with [facility] at the end of this month  Darlina Guys, PharmD, DPLA

## 2022-09-11 ENCOUNTER — Telehealth: Payer: Self-pay | Admitting: Family Medicine

## 2022-09-11 NOTE — Telephone Encounter (Signed)
Form filled out and copy made for chart. Patient's husband notified VIA that it is ready. Dm/cma

## 2022-09-11 NOTE — Telephone Encounter (Signed)
PAPERWORK/FORMS received  Dropped off by: [name], spouse Call back #:  [phone]  Individual made aware of 3-5 business day turn around (YES/NO): yes GREEN charge sheet completed and patient made aware of possible charge (YES/NO): yes  Placed in provider folder at front desk. ~~~ route to CMA/provider Team  CLINICAL USE BELOW THIS LINE (use X to signify action taken)  ___ Form received and placed in providers office for signature. ___ Form completed and faxed to LOA Dept.  ___ Form completed & LVM to notify patient ready for pick up.  ___ Charge sheet and copy of form in front office folder for office supervisor.

## 2022-09-11 NOTE — Telephone Encounter (Signed)
Pt's husband picked up her paperwork.

## 2022-09-14 ENCOUNTER — Other Ambulatory Visit: Payer: Medicare HMO

## 2022-09-14 NOTE — Progress Notes (Signed)
   09/14/2022  Patient ID: Michelle [name], female   DOB: 04-Sep-1940, 82 y.o.   MRN: [id number]  Subjective/Objective: Follow-up appointment to check in on BP readings since decreasing lisinopril from 45m to 220m   -Recent BP readings since 2/12 (newest to oldest): 107/48,115/56, 99/46, 98/46, 124/56,117/55,108/48, 98/48, 93/47 -Does not endorse any s/sx of hypotension   Assesment/Plan -Continue lisinopril 2025maily, recording BP readings until follow-up with nephrology 2/28.  Dose may eventually need to be decreased further to 76m24mily. -I will check in again next Monday to gather BP readings and make sure she has not had further s/sx of hypotension  CherDarlina GuysarmD, DPLA

## 2022-09-21 ENCOUNTER — Other Ambulatory Visit: Payer: Medicare HMO

## 2022-09-21 NOTE — Progress Notes (Signed)
   09/21/2022  Patient ID: Karl Pock, female   DOB: 1941-01-15, 82 y.o.   MRN: [id number]  Subjective/Objective: Follow up telephone visit to check on recent BP readings after decreasing lisinopril dose from 74m daily to 219mdaily. Home BP readings: 2/26- 100/46 2/25- 97/54 2/24- 105/44 2/23- 124/55 2/22- 126/53 2/21- 111/52 2/20- 116/51 2/19- 121/58 Patient and husband endorse no episodes of hypotensive symptoms since our last telephone visit.  Assessment/Plan: -Patient will continue with 2049mose of lisinopril.  They have plenty at home where she was previously on this dosing. -Follow up with nephrology scheduled for 2/29, and they will inform them of this change -Continue to check daily and reach out if concerns with either hypo or hypertensive values and s/sx  CheDarlina GuysharmD, DPLA

## 2022-10-01 ENCOUNTER — Telehealth: Payer: Self-pay | Admitting: Family Medicine

## 2022-10-01 ENCOUNTER — Encounter: Payer: Self-pay | Admitting: Family Medicine

## 2022-10-01 ENCOUNTER — Other Ambulatory Visit: Payer: Self-pay | Admitting: Family Medicine

## 2022-10-01 ENCOUNTER — Telehealth: Payer: Self-pay

## 2022-10-01 DIAGNOSIS — Z1231 Encounter for screening mammogram for malignant neoplasm of breast: Secondary | ICD-10-CM

## 2022-10-01 NOTE — Progress Notes (Signed)
   10/01/2022  Patient ID: Michelle [name], female   DOB: 1941-01-28, 82 y.o.   MRN: [id number]  Mr. Gullikson called today to inquire about getting Michelle [name] a wheelchair and also update me on her blood pressure.  Nephrology suggested that in addition to the decrease in lisinopril from '40mg'$  to '20mg'$  daily, that she also take the furosemide '20mg'$  every other day.  BP today was 110/68, and they are continuing to check daily an monitor for increased swelling with decrease of diuretic.  Mr. Lomack knows to reach out if needed for any hypo/hypertension concerns before visit with Dr. Gena Fray in May.    He will also be contacting Dr. Juliane Lack office to see about getting a prescription for a wheelchair, so this can be billed as DME to Medicare Part B.  Darlina Guys, PharmD, DPLA

## 2022-10-01 NOTE — Telephone Encounter (Signed)
Pt husband called and said the pt need a wheelchair

## 2022-10-01 NOTE — Telephone Encounter (Signed)
Called patient's husband, [name], he states that it is getting harder for patient to stand up and walk. He would like to get an RX for a wheel chair.     Please review and advise.  Thanks. Dm/cma

## 2022-10-02 NOTE — Telephone Encounter (Signed)
Spoke to Joppa, patients husband,  they are using a 4 wheel walker with a seat due to having hip and back pain.  She had seen ortho in the past for this.  Whey are okay with waiting till her next appointment to talk to you about a wheel chair.  Advised if they need to make an earlier appointment please give [place] a call. Dm/cma

## 2022-10-02 NOTE — Telephone Encounter (Signed)
See other note.  Dm/cma

## 2022-10-19 ENCOUNTER — Ambulatory Visit (INDEPENDENT_AMBULATORY_CARE_PROVIDER_SITE_OTHER): Payer: Medicare HMO | Admitting: Family Medicine

## 2022-10-19 ENCOUNTER — Encounter: Payer: Self-pay | Admitting: Family Medicine

## 2022-10-19 VITALS — BP 118/72 | HR 50 | Temp 96.7°F | Ht 63.5 in | Wt 154.2 lb

## 2022-10-19 DIAGNOSIS — H3530 Unspecified macular degeneration: Secondary | ICD-10-CM | POA: Diagnosis not present

## 2022-10-19 DIAGNOSIS — H5316 Psychophysical visual disturbances: Secondary | ICD-10-CM

## 2022-10-19 DIAGNOSIS — N958 Other specified menopausal and perimenopausal disorders: Secondary | ICD-10-CM | POA: Diagnosis not present

## 2022-10-19 DIAGNOSIS — G309 Alzheimer's disease, unspecified: Secondary | ICD-10-CM | POA: Diagnosis not present

## 2022-10-19 DIAGNOSIS — F02A0 Dementia in other diseases classified elsewhere, mild, without behavioral disturbance, psychotic disturbance, mood disturbance, and anxiety: Secondary | ICD-10-CM

## 2022-10-19 LAB — POCT URINALYSIS DIPSTICK
Bilirubin, UA: NEGATIVE
Blood, UA: NEGATIVE
Glucose, UA: NEGATIVE
Ketones, UA: NEGATIVE
Leukocytes, UA: NEGATIVE
Nitrite, UA: NEGATIVE
Protein, UA: NEGATIVE
Spec Grav, UA: 1.015 (ref 1.010–1.025)
Urobilinogen, UA: 0.2 E.U./dL — AB
pH, UA: 6 (ref 5.0–8.0)

## 2022-10-19 MED ORDER — ESTRADIOL 0.1 MG/GM VA CREA: TOPICAL_CREAM | VAGINAL | 12 refills | Status: AC

## 2022-10-19 NOTE — Progress Notes (Signed)
[facility] [address] [place] [address] Dept: [phone] Dept Fax: [phone]  Office Visit  Subjective:    Patient ID: Michelle [name], female    DOB: [birth year], 82 y.o..   MRN: [id number]  Chief Complaint  Patient presents with   Dysuria    C/o having burning with urination x 4 weeks off/on.  Also having loose bowels.    History of Present Illness:  Patient is in today complaining of burning with urination over the past month. She notes sh also has ahd occasional loose stools during this time, so was not sure if these might be related.  Michelle [name] has a history of macular degeneration with visual loss. The vision loss has resulted in [place], in which she has visual hallucinations. Her husband notes that he has been using a wheeled walker with a seta as a quasi wheel chair to help get her around, esp. when they are out shopping, etc. Her visual loss makes it very difficult for her to ambulate on her own. Michelle [name] also raises concerns about how we know that the hallucinations are not an aspect of schizophrenia and/or dementia. Michelle [name] has a history of mild cognitive impairment, identified by Michelle [name] (neurology). Michelle [name] can still care for her basic ADLs. Her dependence on her husband's help for her IADLs is more related to her vision loss.   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   Macular degeneration of both eyes 08/11/2018   Essential hypertension 07/03/2014   History of renal artery  stenosis 06/14/2014   Squamous cell carcinoma in situ of skin 02/08/2013   Dysplastic nevus of trunk 01/23/2013   Contact dermatitis and eczema 10/31/2012   Osteopenia 09/29/2007   Allergic rhinitis 02/24/2007   Generalized OA 02/24/2007   Past Surgical History:  Procedure Laterality Date   ANGIOPLASTY     right renal 1981   BTL     cb x 3     HEMORRHOID SURGERY     IR PTA RENAL VISCERAL  05/11/2017   IR RADIOLOGIST EVAL & MGMT  04/21/2017   IR RENAL BILAT S&I MOD SED  05/11/2017   IR US GUIDE VASC ACCESS RIGHT  05/11/2017   Rt ovarian cyst removed     TONSILLECTOMY AND ADENOIDECTOMY     Family History  Problem Relation Age of Onset   Diabetes Mother    Hypertension Mother    Stroke Brother    Diabetes Maternal Aunt    Outpatient Medications Prior to Visit  Medication Sig Dispense Refill   acetaminophen (TYLENOL) 500 MG tablet Take 1,000 mg by mouth at bedtime.     Bevacizumab (AVASTIN) 100 MG/4ML SOLN Inject 1-10 mg into the eye See admin instructions. For eyes One every 6 weeks 1/10 mg     furosemide (LASIX) 20 MG tablet Take 20 mg by mouth daily. Every other day     ibuprofen (ADVIL) 200 MG tablet Take 200 mg by mouth every 6 (six) hours as needed.     lisinopril (ZESTRIL) 20 MG tablet Take 20 mg  by mouth daily.     memantine (NAMENDA) 10 MG tablet Take 10 mg by mouth at bedtime.     omeprazole (PRILOSEC) 20 MG capsule Take 20 mg by mouth daily before breakfast.     QUEtiapine (SEROQUEL) 25 MG tablet TAKE 1 TABLET BY MOUTH TWICE A DAY 180 tablet 2   No facility-administered medications prior to visit.   Allergies  Allergen Reactions   Iodinated Contrast Media Hives    Patient believes this was an older form of iodinated contrast.  Tolerated OMNIPAQUE IOHEXOL 11/19/14 for renal arteriogram/angioplasty without prep. Michelle Lore, RN   Michelle [name] Other (See Comments)    unknown   Diphenhydramine Hcl Hives     Objective:   Today's Vitals   10/19/22 1430  BP: 118/72  Pulse:  (!) 50  Temp: (!) 96.7 F (35.9 C)  TempSrc: Temporal  SpO2: 97%  Weight: 154 lb 3.2 oz (69.9 kg)  Height: 5' 3.5" (1.613 m)   Body mass index is 26.89 kg/m.   General: Well developed, well nourished. No acute distress. Psych: Alert and oriented. Normal mood and affect.  There are no preventive care reminders to display for this patient.  Lab Results    Component Ref Range & Units 14:55 (10/19/22)  Color, UA yellow  Clarity, UA clear  Glucose, UA Negative Negative  Bilirubin, UA neg  Ketones, UA neg  Spec Grav, UA 1.010 - 1.025 1.015  Blood, UA neg  pH, UA 5.0 - 8.0 6.0  Protein, UA Negative Negative  Urobilinogen, UA 0.2 or 1.0 E.U./dL 0.2 Abnormal   Nitrite, UA neg  Leukocytes, UA Negative Negative   Assessment & Plan:   Problem List Items Addressed This Visit       Nervous and Auditory   [name]    We discussed how the visual hallucinations of CBS are different than the often auditory hallucinations of schizophrenia. Michelle [name] ahs not demonstrated aspects delusional thinking that is also part of schizophrenia.      Relevant Orders   DME Wheelchair manual   Dementia [facility])    We discussed the progressive nature of dementia when it occurs. I will continue to try and support the [place] as they continue to try and mange with the challenges she faces.        Genitourinary   Genitourinary syndrome of menopause - Primary    Michelle [name]'s urine is normal without sing of infection. I suspect she has GUSM. I will prescribe estradiol vaginal cream. We will see fi this helps resolve her symptoms.      Relevant Medications   estradiol (ESTRACE) 0.1 MG/GM vaginal cream   Other Relevant Orders   POCT Urinalysis Dipstick (Completed)     Other   Macular degeneration of both eyes    I will write an order for a wheelchair to assist with mobility when outside the home.      Return for Follow-up as scheduled.   Michelle Salter, MD

## 2022-10-19 NOTE — Assessment & Plan Note (Signed)
We discussed how the visual hallucinations of CBS are different than the often auditory hallucinations of schizophrenia. Michelle [name] ahs not demonstrated aspects delusional thinking that is also part of schizophrenia.

## 2022-10-19 NOTE — Assessment & Plan Note (Signed)
Michelle [name] urine is normal without sing of infection. I suspect she has GUSM. I will prescribe estradiol vaginal cream. We will see fi this helps resolve her symptoms.

## 2022-10-19 NOTE — Assessment & Plan Note (Signed)
I will write an order for a wheelchair to assist with mobility when outside the home.

## 2022-10-19 NOTE — Assessment & Plan Note (Signed)
We discussed the progressive nature of dementia when it occurs. I will continue to try and support the [place] as they continue to try and mange with the challenges she faces.

## 2022-10-20 NOTE — Telephone Encounter (Signed)
Order and last OV note faxed to [facility] @ [phone].  Dm/cma

## 2022-10-20 NOTE — Telephone Encounter (Signed)
Pt brought up # of where to call to get the [place] wheelchair. Chuck Hint at [facility] at [phone]. He was told they need a call to be able to get pt a wheelchair. I have copy of DME Wheelchair manual order and a copy of the business card for [facility], I have placed in DR Rudd's folder up front.

## 2022-10-20 NOTE — Telephone Encounter (Signed)
Called [facility] to see what kind of wheel chair information needed.  They will cam me back.  Dm/cma

## 2022-10-21 ENCOUNTER — Telehealth: Payer: Self-pay | Admitting: Family Medicine

## 2022-10-21 NOTE — Telephone Encounter (Signed)
Spoke to Michelle [name], refaxed information with diagnosis  code on it to be able to be processed.Dm/cma

## 2022-10-21 NOTE — Telephone Encounter (Signed)
Pt returned your call.  

## 2022-10-26 NOTE — Telephone Encounter (Signed)
Spoke to patient and was advised that [facility] will be bringing the wheel chair out to them tomorrow.  Dm/cma

## 2022-11-19 ENCOUNTER — Ambulatory Visit
Admission: RE | Admit: 2022-11-19 | Discharge: 2022-11-19 | Disposition: A | Discharge status: Home / Self Care | Payer: Medicare HMO | Source: Ambulatory Visit | Attending: Family Medicine | Admitting: Family Medicine

## 2022-11-19 DIAGNOSIS — Z1231 Encounter for screening mammogram for malignant neoplasm of breast: Secondary | ICD-10-CM

## 2022-11-23 ENCOUNTER — Encounter: Payer: Self-pay | Admitting: Family Medicine

## 2022-11-25 NOTE — Progress Notes (Unsigned)
No chief complaint on file.   HISTORY OF PRESENT ILLNESS:  11/25/22 ALL:  Michelle [name] is a 82 y.o. female here today for follow up for MCI. She is followed by ophthalmology for Maureen Ralphs Syndrome. She continues memantine 10mg  daily. She was last seen by Dr Pearlean Brownie 04/2022 and reported some improvement in visual hallucinations.    HISTORY (copied from Dr Marlis Edelson previous note)  Michelle [name] is a 82 year old Caucasian lady seen today for initial office consultation visit.  History is obtained from the patient and her husband who is accompanying her today as well as review of electronic medical records and I personally reviewed pertinent available imaging films in PACS.  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  She was recently admitted to [facility] on 12/01/2020 with E. coli bacteremia and at that time was found to have altered mental status with confusion disorientation.  She also had transient weakness and slurred speech and presyncopal symptoms.  She was treated with IV Rocephin and discharged on cefdinir.  CT scan of the head showed incidental finding of a calcified mass in the pineal region.  She felt better on antibiotics and was discharged home.  When her urine culture came back positive for E. coli she was called back to come to the hospital.  She was referred to neurology for evaluation for abnormal CT scan.  An MRI scan of the brain was obtained without contrast on 12/02/2020 which I personally reviewed and shows a heterogeneous calcified T2 hypointense extra-axial mass located posterior and superior to the pineal gland measuring 1.5 into 1 cm with mild mass-effect on the splenium of corpus callosum with a 1.1 cm cyst in the pineal gland itself.  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  The patient and her husband's main complaint today is about her short-term memory loss which she has had for about a year.  She has trouble remembering recent information and forgets how to write things down.  She is aware of functional quite independent and manages her own affairs well.  The patient is also had formed fixed visual hallucinations and sees people's in the room.  This has been attributed to her severe macular degeneration and she has seen ophthalmologist Dr. Ashley Royalty who is diagnosed her with Maureen Ralphs syndrome.  Patient is functionally blind and can barely see objects at 2 to 3 feet distance.  She knows the hallucinations are not real and does get a little anxious.  She has been started on Klonopin which seems to help her anxiety.  She denies any headache episodes of prior stroke, TIA, seizures or loss of consciousness.  There is no family history of dementia.  Her cognitive impairment testing was limited today due to her severe visual impairment but the head 3 word recall was diminished at 0/3 and she was able to name only 7 animals which can walk on 4 legs.  Update 11/26/2021 : She returns for follow-up after last visit a year ago.  She was supposed to follow-up earlier but appointments were rescheduled a couple of times.  Patient had lab work which I ordered at the last visit and vitamin B12, TSH and RPR were all normal on 11/29/2020.  EEG done on 12/11/2020 showed mild generalized  slowing without any epileptiform activity.  Patient was started on Aricept by me which she took for nearly 5 months and discontinued as they did not notice any benefit.  She was able to tolerate however without side effects.  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  She continues to have very poor  vision which is mostly cloudy.  She continues to have complex visual hallucinations in the form of seeing people or animals.  At times she also she is in forms and patents.  She continues to have poor short-term memory and cognitive difficulties but these do not appear to be significantly progressing.  She is able to ambulate inside her house without with a cane but when going outdoors and in unfamiliar situations she has to hold her husband's hand.  On her limited cognitive testing today due to her visual impairment she had 0/3 on recall and was able to name 11 animals.  Update 05/25/2022 : She returns for follow-up after last visit 6 months ago.  Patient states she is tolerating Namenda well without any side effects.  Has noticed improvement in her complex visual hallucinations and these are not as  irritating anymore.  She is not sure if this is a medication visit.  Previous fusion.  She is also noticed improvement in her cognition.  Her vision continues to remains quite poor and she can manage to get around inside of her house wearing glasses.  She has not had any falls or injuries.  She continues to have poor short-term memory and cognitive difficulties since.  Disease improved   REVIEW OF SYSTEMS: Out of a complete 14 system review of symptoms, the patient complains only of the following symptoms, and all other reviewed systems are negative.   ALLERGIES: Allergies  Allergen Reactions   Iodinated Contrast Media Hives    Patient believes this was an older form of iodinated contrast.  Tolerated OMNIPAQUE IOHEXOL 11/19/14 for renal arteriogram/angioplasty without prep. Larina Earthly, RN   Cyd Silence Other (See Comments)    unknown   Diphenhydramine Hcl Hives     HOME MEDICATIONS: Outpatient Medications Prior to Visit  Medication Sig Dispense Refill   acetaminophen (TYLENOL) 500 MG tablet Take 1,000 mg by mouth at bedtime.     Bevacizumab (AVASTIN) 100 MG/4ML SOLN Inject 1-10 mg into the eye See admin  instructions. For eyes One every 6 weeks 1/10 mg     furosemide (LASIX) 20 MG tablet Take 20 mg by mouth daily. Every other day     ibuprofen (ADVIL) 200 MG tablet Take 200 mg by mouth every 6 (six) hours as needed.     lisinopril (ZESTRIL) 20 MG tablet Take 20 mg by mouth daily.     memantine (NAMENDA) 10 MG tablet Take 10 mg by mouth at bedtime.     omeprazole (PRILOSEC) 20 MG capsule Take 20 mg by mouth daily before breakfast.     QUEtiapine (SEROQUEL) 25 MG tablet TAKE 1 TABLET BY MOUTH TWICE A DAY 180 tablet 2   No facility-administered medications prior to visit.     PAST MEDICAL HISTORY: Past Medical History:  Diagnosis Date   Allergic rhinitis    Cystocele    Fibromuscular dysplasia of renal artery (HCC)    Hypertension    fibromuscular dysplasia   Osteoarthritis    Osteoporosis    PMS (premenstrual syndrome)    Retinal hemorrhage, right  with macular degeneration     PAST SURGICAL HISTORY: Past Surgical History:  Procedure Laterality Date   ANGIOPLASTY     right renal 1981   BTL     cb x 3     HEMORRHOID SURGERY     IR PTA RENAL VISCERAL  05/11/2017   IR RADIOLOGIST EVAL & MGMT  04/21/2017   IR RENAL BILAT S&I MOD SED  05/11/2017   IR US GUIDE VASC ACCESS RIGHT  05/11/2017   Rt ovarian cyst removed     TONSILLECTOMY AND ADENOIDECTOMY       FAMILY HISTORY: Family History  Problem Relation Age of Onset   Diabetes Mother    Hypertension Mother    Stroke Brother    Diabetes Maternal Aunt      SOCIAL HISTORY: Social History   Socioeconomic History   Marital status: Married    Spouse name: Leonette Most   Number of children: 3   Years of education: Not on file   Highest education level: Not on file  Occupational History   Not on file  Tobacco Use   Smoking status: Former   Smokeless tobacco: Former    Quit date: 10/27/1983  Vaping Use   Vaping Use: Never used  Substance and Sexual Activity   Alcohol use: No   Drug use: No   Sexual activity: Yes   Other Topics Concern   Not on file  Social History Narrative   Lives with husband at home   Right Handed   Drinks 4-6 cups caffeine daily      Three living children (lost one child in infancy and another in a MVA in early 67s)   Social Determinants of Health   Financial Resource Strain: Low Risk  (06/01/2022)   Overall Financial Resource Strain (CARDIA)    Difficulty of Paying Living Expenses: Not hard at all  Food Insecurity: No Food Insecurity (06/01/2022)   Hunger Vital Sign    Worried About Running Out of Food in the Last Year: Never true    Ran Out of Food in the Last Year: Never true  Transportation Needs: No Transportation Needs (06/01/2022)   PRAPARE - Administrator, Civil Service (Medical): No    Lack of Transportation (Non-Medical): No  Physical Activity: Inactive (06/01/2022)   Exercise Vital Sign    Days of Exercise per Week: 0 days    Minutes of Exercise per Session: 0 min  Stress: No Stress Concern Present (06/01/2022)   [employer] of Occupational Health - Occupational Stress Questionnaire    Feeling of Stress : Not at all  Social Connections: Moderately Isolated (05/27/2021)   Social Connection and Isolation Panel [NHANES]    Frequency of Communication with Friends and Family: More than three times a week    Frequency of Social Gatherings with Friends and Family: Three times a week    Attends Religious Services: Never    Active Member of Clubs or Organizations: No    Attends Banker Meetings: Never    Marital Status: Married  Catering manager Violence: Not At Risk (05/27/2021)   Humiliation, Afraid, Rape, and Kick questionnaire    Fear of Current or Ex-Partner: No    Emotionally Abused: No    Physically Abused: No    Sexually Abused: No     PHYSICAL EXAM  There were no vitals filed for this visit. There is no height or weight on file to calculate BMI.  Generalized: Well developed, in no acute distress  Cardiology: normal  rate and rhythm, no murmur auscultated  Respiratory: clear to auscultation bilaterally    Neurological examination  Mentation: Alert oriented to time, place, history taking. Follows all commands speech and language fluent Cranial nerve II-XII: Pupils were equal round reactive to light. Extraocular movements were full, visual field were full on confrontational test. Facial sensation and strength were normal. Uvula tongue midline. Head turning and shoulder shrug  were normal and symmetric. Motor: The motor testing reveals 5 over 5 strength of all 4 extremities. Good symmetric motor tone is noted throughout.  Sensory: Sensory testing is intact to soft touch on all 4 extremities. No evidence of extinction is noted.  Coordination: Cerebellar testing reveals good finger-nose-finger and heel-to-shin bilaterally.  Gait and station: Gait is normal. Tandem gait is normal. Romberg is negative. No drift is seen.  Reflexes: Deep tendon reflexes are symmetric and normal bilaterally.    DIAGNOSTIC DATA (LABS, IMAGING, TESTING) - I reviewed patient records, labs, notes, testing and imaging myself where available.  Lab Results  Component Value Date   WBC 4.2 08/05/2022   HGB 14.1 08/05/2022   HCT 41.4 08/05/2022   MCV 89.3 08/05/2022   PLT 200.0 08/05/2022      Component Value Date/Time   NA 146 (H) 08/05/2022 1004   K 3.7 08/05/2022 1004   CL 106 08/05/2022 1004   CO2 29 08/05/2022 1004   GLUCOSE 99 08/05/2022 1004   BUN 16 08/05/2022 1004   CREATININE 1.14 08/05/2022 1004   CALCIUM 9.9 08/05/2022 1004   CALCIUM 9.8 10/28/2009 2154   PROT 6.5 08/07/2021 0348   ALBUMIN 3.5 08/07/2021 0348   AST 37 08/07/2021 0348   ALT 35 08/07/2021 0348   ALKPHOS 90 08/07/2021 0348   BILITOT 1.1 08/07/2021 0348   GFRNONAA 55 (L) 08/08/2021 0349   GFRAA 46 (L) 05/11/2017 0722   Lab Results  Component Value Date   CHOL 174 03/15/2020   HDL 51.30 03/15/2020   LDLCALC 95 03/15/2020   LDLDIRECT 106.0  10/27/2010   TRIG 141.0 03/15/2020   CHOLHDL 3 03/15/2020   Lab Results  Component Value Date   HGBA1C 5.3 09/14/2006   Lab Results  Component Value Date   VITAMINB12 280 08/05/2022   Lab Results  Component Value Date   TSH 2.37 08/05/2022       11/26/2021    3:09 PM 12/09/2020    3:13 PM  MMSE - Mini Mental State Exam  Not completed: Unable to complete   Orientation to time 4 4  Orientation to Place 5 5  Registration 3 3  Attention/ Calculation 3 1  Recall 0 0  Language- name 2 objects  2  Language- repeat  0  Language- follow 3 step command  3  Language- read & follow direction  1  Write a sentence  1  Copy design  0  Copy design-comments  unable to do/visual limitations  Total score  20         No data to display           ASSESSMENT AND PLAN  82 y.o. year old female  has a past medical history of Allergic rhinitis, Cystocele, Fibromuscular dysplasia of renal artery (HCC), Hypertension, Osteoarthritis, Osteoporosis, PMS (premenstrual syndrome), and Retinal hemorrhage, right. here with    No diagnosis found.  Hassan Rowan ***.  Healthy lifestyle habits encouraged. *** will follow up with PCP as directed. *** will return to see me in ***, sooner if  needed. *** verbalizes understanding and agreement with this plan.   No orders of the defined types were placed in this encounter.    No orders of the defined types were placed in this encounter.    Shawnie Dapper, MSN, FNP-C 11/25/2022, 4:13 PM  [facility] [address] [place], [place] [address] [phone]

## 2022-11-25 NOTE — Patient Instructions (Signed)
Below is our plan:  We will increase memantine to 10mg  twice daily. You can either take 1/2 tablet in the morning and full tablet in the evenings or take full tablet every other morning for a week then increase to 10mg  twice daily.   Please make sure you are staying well hydrated. I recommend 50-60 ounces daily. Well balanced diet and regular exercise encouraged. Consistent sleep schedule with 6-8 hours recommended.   Please continue follow up with care team as directed.   Follow up with me in 6 months   You may receive a survey regarding today's visit. I encourage you to leave honest feed back as I do use this information to improve patient care. Thank you for seeing me today!   Management of Memory Problems   There are some general things you can do to help manage your memory problems.  Your memory may not in fact recover, but by using techniques and strategies you will be able to manage your memory difficulties better.   1)  Establish a routine. Try to establish and then stick to a regular routine.  By doing this, you will get used to what to expect and you will reduce the need to rely on your memory.  Also, try to do things at the same time of day, such as taking your medication or checking your calendar first thing in the morning. Think about think that you can do as a part of a regular routine and make a list.  Then enter them into a daily planner to remind you.  This will help you establish a routine.   2)  Organize your environment. Organize your environment so that it is uncluttered.  Decrease visual stimulation.  Place everyday items such as keys or cell phone in the same place every day (ie.  Basket next to front door) Use post it notes with a brief message to yourself (ie. Turn off light, lock the door) Use labels to indicate where things go (ie. Which cupboards are for food, dishes, etc.) Keep a notepad and pen by the telephone to take messages   3)  Memory Aids A diary or  journal/notebook/daily planner Making a list (shopping list, chore list, to do list that needs to be done) Using an alarm as a reminder (kitchen timer or cell phone alarm) Using cell phone to store information (Notes, Calendar, Reminders) Calendar/White board placed in a prominent position Post-it notes   In order for memory aids to be useful, you need to have good habits.  It's no good remembering to make a note in your journal if you don't remember to look in it.  Try setting aside a certain time of day to look in journal.   4)  Improving mood and managing fatigue. There may be other factors that contribute to memory difficulties.  Factors, such as anxiety, depression and tiredness can affect memory. Regular gentle exercise can help improve your mood and give you more energy. Exercise: there are short videos created by the [employer] on Health specially for older adults: https://bit.ly/2I30q97.  Mediterranean diet: which emphasizes fruits, vegetables, whole grains, legumes, fish, and other seafood; unsaturated fats such as olive oils; and low amounts of red meat, eggs, and sweets. A variation of this, called MIND (Mediterranean-DASH Intervention for Neurodegenerative Delay) incorporates the DASH (Dietary Approaches to Stop Hypertension) diet, which has been shown to lower high blood pressure, a risk factor for Alzheimer's disease. More information at: ExitMarketing.de.  Aerobic exercise that improve  heart health is also good for the mind.  [employer] on Aging have short videos for exercises that you can do at home: BlindWorkshop.com.pt Simple relaxation techniques may help relieve symptoms of anxiety Try to get back to completing activities or hobbies you enjoyed doing in the past. Learn to pace yourself through activities to decrease fatigue. Find out about some local support groups where you can share  experiences with others. Try and achieve 7-8 hours of sleep at night.   Resources for Family/Caregiver  Online caregiver support groups can be found at WesternTunes.it or call Alzheimer's Association's 24/7 hotline: 412-844-4499. [facility] Memory Counseling Program offers in-person, virtual support groups and individual counseling for both care partners and persons with memory loss. Call for more information at [phone].   Advanced care plan: there are two types of Power of Attorney: healthcare and durable. Healthcare POA is a designated person to make healthcare decisions on your behalf if you were too sick to make them yourself. This person can be selected and documented by your physician. Durable POA has to be set up with a lawyer who takes charge of your finances and estate if you were too sick or cognitively impaired to manage your finances accurately. You can find a local Elder Therapist, art here: [place].at.  Check out www.planyourlifespan.org, which will help you plan before a crisis and decide who will take care of life considerations in a circumstance where you may not be able to speak for yourself.   Helpful books (available on [employer] or your local bookstore):  By Dr. Carl Best: Keeping Love Alive as Memories Fade: The 5 Love Languages and the Alzheimer's Journey Apr 27, 2015 The Dementia Care Partner's Workbook: A Guide for Understanding, Education, and [employer] - December 25, 2017.  Both available for less than $15.   "Coping with behavior change in dementia: a family caregiver's guide" by Ricardo Jericho & Valora Piccolo "A Caregiver's Guide to Dementia: Using Activities and Other Strategies to Prevent, Reduce and Manage Behavioral Symptoms" by Mahala Menghini Gitlin and [employer].  Youth worker of Joy for the Person with Alzheimer's or Dementia" 4th edition by Tama High  Caregiver videos on common behaviors related to dementia:  PopulationGame.pl  [place] Caregiver Portal: free to sign up, links to local resources: [email or web address]-caregivers.[email or web address]/login

## 2022-11-26 ENCOUNTER — Ambulatory Visit (INDEPENDENT_AMBULATORY_CARE_PROVIDER_SITE_OTHER): Payer: Medicare HMO | Admitting: Family Medicine

## 2022-11-26 ENCOUNTER — Encounter: Payer: Self-pay | Admitting: Family Medicine

## 2022-11-26 VITALS — BP 116/70 | HR 65 | Ht 62.0 in | Wt 149.0 lb

## 2022-11-26 DIAGNOSIS — G3184 Mild cognitive impairment, so stated: Secondary | ICD-10-CM

## 2022-11-26 DIAGNOSIS — R441 Visual hallucinations: Secondary | ICD-10-CM | POA: Diagnosis not present

## 2022-11-26 DIAGNOSIS — H5316 Psychophysical visual disturbances: Secondary | ICD-10-CM

## 2022-11-26 MED ORDER — MEMANTINE HCL 10 MG PO TABS: 10.0000 mg | ORAL_TABLET | Freq: Every evening | ORAL | 3 refills | Status: DC

## 2022-11-29 ENCOUNTER — Other Ambulatory Visit: Payer: Self-pay | Admitting: Family Medicine

## 2022-12-04 ENCOUNTER — Ambulatory Visit: Payer: Medicare HMO | Admitting: Family Medicine

## 2022-12-07 ENCOUNTER — Ambulatory Visit (INDEPENDENT_AMBULATORY_CARE_PROVIDER_SITE_OTHER): Payer: Medicare HMO | Admitting: Family Medicine

## 2022-12-07 ENCOUNTER — Encounter: Payer: Self-pay | Admitting: Family Medicine

## 2022-12-07 VITALS — BP 118/70 | HR 57 | Temp 98.0°F | Ht 62.0 in | Wt 152.8 lb

## 2022-12-07 DIAGNOSIS — N958 Other specified menopausal and perimenopausal disorders: Secondary | ICD-10-CM

## 2022-12-07 DIAGNOSIS — I10 Essential (primary) hypertension: Secondary | ICD-10-CM | POA: Diagnosis not present

## 2022-12-07 DIAGNOSIS — G309 Alzheimer's disease, unspecified: Secondary | ICD-10-CM

## 2022-12-07 DIAGNOSIS — H5316 Psychophysical visual disturbances: Secondary | ICD-10-CM | POA: Diagnosis not present

## 2022-12-07 DIAGNOSIS — F02A0 Dementia in other diseases classified elsewhere, mild, without behavioral disturbance, psychotic disturbance, mood disturbance, and anxiety: Secondary | ICD-10-CM

## 2022-12-07 NOTE — Assessment & Plan Note (Signed)
Stable. Managing anxiety associated with visual hallucinations better on quetiapine 25 mg bid.

## 2022-12-07 NOTE — Assessment & Plan Note (Signed)
By history, Ms. Michelle [name] has more than mild cognitive impairment. Neurology recently increased her memantine to 10 mg bid. I will continue to provide support for her and her husband. at present, they doi not identify any unmet needs.

## 2022-12-07 NOTE — Progress Notes (Signed)
[facility] PRIMARY CARE [facility] PRIMARY [address] [place] [address] Dept: [phone] Dept Fax: [phone]  Chronic Care Office Visit  Subjective:    Patient ID: Michelle [name], female    DOB: [birth year], 82 y.o..   MRN: [id number]  Chief Complaint  Patient presents with   Medical Management of Chronic Issues    4 month f/u.      History of Present Illness:  Patient is in today for reassessment of chronic medical issues.  Michelle [name] has a history of macular degeneration with visual loss. The vision loss has resulted in Michelle [name], in which she has visual hallucinations. Michelle [name] has a history of mild cognitive impairment, identified by Michelle [name] (neurology). Her husband notes that there are days where she is completely disoriented. She is currently managed on memantine 10 mg bid (recent dose increase). Michelle [name] can still care for her basic ADLs. Her dependence on her husband's help for her IADLs is more related to her vision loss. At her last visit, I ordered a wheelchair for her. Michelle [name] notes this has been very helpful for assisting in getting her around. Michelle [name] has been on quetiapine 25 mg twice daily for help with anxiety/fear related to her hallucinations. This continues to help.  Michelle [name] has a history of hypertension. She is currently managed on lisinopril 20 mg daily and furosemide 20 mg every other daily.   At her last visit, Michelle [name] was having symptoms suggestive of GUSM. I prescribed an estrogen vaginal cream. This did help resolve her issues. They have not refilled the cream at this point.  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   Macular degeneration of both eyes 08/11/2018   Essential hypertension 07/03/2014   History of renal artery stenosis 06/14/2014   Squamous cell carcinoma in situ of skin 02/08/2013   Dysplastic nevus of trunk 01/23/2013   Contact dermatitis and eczema 10/31/2012   Osteopenia 09/29/2007   Allergic rhinitis 02/24/2007   Generalized OA 02/24/2007   Past Surgical History:  Procedure Laterality Date   ANGIOPLASTY     right renal 1981   BTL     cb x 3     HEMORRHOID SURGERY     IR PTA RENAL VISCERAL  05/11/2017   IR RADIOLOGIST EVAL & MGMT  04/21/2017   IR RENAL BILAT S&I MOD SED  05/11/2017   IR US GUIDE VASC ACCESS RIGHT  05/11/2017   Rt ovarian cyst removed     TONSILLECTOMY AND ADENOIDECTOMY     Family History  Problem Relation Age of Onset   Diabetes Mother    Hypertension Mother    Stroke Brother    Diabetes Maternal Aunt    Outpatient Medications Prior to Visit  Medication Sig Dispense Refill   acetaminophen (TYLENOL) 500 MG tablet Take 1,000 mg by mouth at bedtime.     Bevacizumab (AVASTIN) 100 MG/4ML SOLN Inject 1-10 mg into the eye See admin instructions. For eyes One every 6 weeks 1/10 mg     furosemide (LASIX) 20  MG tablet Take 20 mg by mouth daily. Every other day     lisinopril (ZESTRIL) 20 MG tablet Take 20 mg by mouth daily.     memantine (NAMENDA) 10 MG tablet Take 1 tablet (10 mg total) by mouth at bedtime. (Patient taking differently: Take 10 mg by mouth at bedtime. Taking 1 in am and 1 at bedtime.) 180 tablet 3   omeprazole (PRILOSEC) 20 MG capsule TAKE 1 CAPSULE BY MOUTH EVERY DAY 90 capsule 2   QUEtiapine (SEROQUEL) 25 MG tablet TAKE 1 TABLET BY MOUTH TWICE A DAY 180 tablet 2   ibuprofen (ADVIL) 200 MG tablet Take 200 mg by mouth every 6 (six) hours as needed. (Patient not taking:  Reported on 12/07/2022)     No facility-administered medications prior to visit.   Allergies  Allergen Reactions   Iodinated Contrast Media Hives    Patient believes this was an older form of iodinated contrast.  Tolerated OMNIPAQUE IOHEXOL 11/19/14 for renal arteriogram/angioplasty without prep. Michelle Earthly, RN   Michelle [name] Other (See Comments)    unknown   Diphenhydramine Hcl Hives   Objective:   Today's Vitals   12/07/22 0849  BP: 118/70  Pulse: (!) 57  Temp: 98 F (36.7 C)  TempSrc: Temporal  SpO2: 98%  Weight: 152 lb 12.8 oz (69.3 kg)  Height: 5\' 2"  (1.575 m)   Body mass index is 27.95 kg/m.   General: Well developed, well nourished. No acute distress. Psych: Alert and oriented. Normal mood and affect.  There are no preventive care reminders to display for this patient.    Assessment & Plan:   Problem List Items Addressed This Visit       Cardiovascular and Mediastinum   Essential hypertension - Primary    Blood pressure is in good control. Continue lisinopril 20 mg daily and furosemide 20 mg every other daily        Nervous and Auditory   Charles Bonnet [name]    Stable. Managing anxiety associated with visual hallucinations better on quetiapine 25 mg bid.      Dementia (HCC)    By history, [name] has more than mild cognitive impairment. Neurology recently increased her memantine to 10 mg bid. I will continue to provide support for her and her husband. at present, they doi not identify any unmet needs.        Genitourinary   Genitourinary [name] of menopause    Improved. No longer using the estrogen cream. We will monitor this for now.       Return in about 4 months (around 04/09/2023) for Reassessment.   Michelle Mast, MD

## 2022-12-07 NOTE — Assessment & Plan Note (Signed)
Blood pressure is in good control. Continue lisinopril 20 mg daily and furosemide 20 mg every other daily

## 2022-12-07 NOTE — Assessment & Plan Note (Signed)
Improved. No longer using the estrogen cream. We will monitor this for now.

## 2023-01-04 ENCOUNTER — Encounter: Payer: Self-pay | Admitting: Family Medicine

## 2023-01-04 DIAGNOSIS — F02A0 Dementia in other diseases classified elsewhere, mild, without behavioral disturbance, psychotic disturbance, mood disturbance, and anxiety: Secondary | ICD-10-CM

## 2023-01-04 DIAGNOSIS — H5316 Psychophysical visual disturbances: Secondary | ICD-10-CM

## 2023-01-13 ENCOUNTER — Other Ambulatory Visit: Payer: Self-pay

## 2023-01-13 ENCOUNTER — Telehealth: Payer: Self-pay | Admitting: Family Medicine

## 2023-01-13 ENCOUNTER — Encounter (HOSPITAL_COMMUNITY): Payer: Self-pay

## 2023-01-13 ENCOUNTER — Emergency Department (HOSPITAL_COMMUNITY)
Admission: EM | Admit: 2023-01-13 | Discharge: 2023-01-13 | Disposition: A | Discharge status: Home / Self Care | Payer: Medicare HMO | Attending: Emergency Medicine | Admitting: Emergency Medicine

## 2023-01-13 ENCOUNTER — Emergency Department (HOSPITAL_COMMUNITY): Payer: Medicare HMO

## 2023-01-13 DIAGNOSIS — R410 Disorientation, unspecified: Secondary | ICD-10-CM | POA: Insufficient documentation

## 2023-01-13 DIAGNOSIS — R4689 Other symptoms and signs involving appearance and behavior: Secondary | ICD-10-CM

## 2023-01-13 LAB — CBC WITH DIFFERENTIAL/PLATELET
Abs Immature Granulocytes: 0.01 10*3/uL (ref 0.00–0.07)
Basophils Absolute: 0 10*3/uL (ref 0.0–0.1)
Basophils Relative: 1 %
Eosinophils Absolute: 0.1 10*3/uL (ref 0.0–0.5)
Eosinophils Relative: 1 %
HCT: 39.8 % (ref 36.0–46.0)
Hemoglobin: 13.3 g/dL (ref 12.0–15.0)
Immature Granulocytes: 0 %
Lymphocytes Relative: 22 %
Lymphs Abs: 0.9 10*3/uL (ref 0.7–4.0)
MCH: 29.9 pg (ref 26.0–34.0)
MCHC: 33.4 g/dL (ref 30.0–36.0)
MCV: 89.4 fL (ref 80.0–100.0)
Monocytes Absolute: 0.3 10*3/uL (ref 0.1–1.0)
Monocytes Relative: 7 %
Neutro Abs: 2.9 10*3/uL (ref 1.7–7.7)
Neutrophils Relative %: 69 %
Platelets: 177 10*3/uL (ref 150–400)
RBC: 4.45 MIL/uL (ref 3.87–5.11)
RDW: 12.1 % (ref 11.5–15.5)
WBC: 4.1 10*3/uL (ref 4.0–10.5)
nRBC: 0 % (ref 0.0–0.2)

## 2023-01-13 LAB — COMPREHENSIVE METABOLIC PANEL
ALT: 11 U/L (ref 0–44)
AST: 16 U/L (ref 15–41)
Albumin: 3.9 g/dL (ref 3.5–5.0)
Alkaline Phosphatase: 69 U/L (ref 38–126)
Anion gap: 5 (ref 5–15)
BUN: 16 mg/dL (ref 8–23)
CO2: 27 mmol/L (ref 22–32)
Calcium: 9.8 mg/dL (ref 8.9–10.3)
Chloride: 108 mmol/L (ref 98–111)
Creatinine, Ser: 1.11 mg/dL — ABNORMAL HIGH (ref 0.44–1.00)
GFR, Estimated: 49 mL/min — ABNORMAL LOW (ref >60)
Glucose, Bld: 108 mg/dL — ABNORMAL HIGH (ref 70–99)
Potassium: 3.8 mmol/L (ref 3.5–5.1)
Sodium: 140 mmol/L (ref 135–145)
Total Bilirubin: 0.8 mg/dL (ref 0.3–1.2)
Total Protein: 6.7 g/dL (ref 6.5–8.1)

## 2023-01-13 LAB — URINALYSIS, ROUTINE W REFLEX MICROSCOPIC
Bilirubin Urine: NEGATIVE
Glucose, UA: NEGATIVE mg/dL
Hgb urine dipstick: NEGATIVE
Ketones, ur: NEGATIVE mg/dL
Nitrite: NEGATIVE
Protein, ur: NEGATIVE mg/dL
Specific Gravity, Urine: 1.003 — ABNORMAL LOW (ref 1.005–1.030)
pH: 6 (ref 5.0–8.0)

## 2023-01-13 MED ORDER — LACTATED RINGERS IV SOLN: INTRAVENOUS | Status: DC

## 2023-01-13 NOTE — ED Notes (Signed)
Patient transported to MRI via wheelchair.

## 2023-01-13 NOTE — ED Triage Notes (Signed)
Per husband pt fell a few days ago and has been having "erratic behavior" since the fall.

## 2023-01-13 NOTE — Telephone Encounter (Signed)
LVM to return call.

## 2023-01-13 NOTE — ED Provider Notes (Signed)
[place] EMERGENCY DEPARTMENT AT [facility] Provider Note   CSN: [id number] Arrival date & time: 01/13/23  1040     History  Chief Complaint  Patient presents with   [name]    Michelle [name] is a 82 y.o. female.  82 year old female presents with intermittent confusion which has been going on for quite some time but worse yesterday according to the husband.  Patient has periods of disorientation.  No associated focal weakness associated with this.  No recent medication changes.  Was walking on stairs and the step gave way and she did sit down to her hips.  Denies striking her head or having loss of consciousness.  Patient states he feels at her baseline at this time.       Home Medications Prior to Admission medications   Medication Sig Start Date End Date Taking? Authorizing Provider  acetaminophen (TYLENOL) 500 MG tablet Take 1,000 mg by mouth at bedtime.    [provider]  Bevacizumab (AVASTIN) 100 MG/4ML SOLN Inject 1-10 mg into the eye See admin instructions. For eyes One every 6 weeks 1/10 mg    [provider]  furosemide (LASIX) 20 MG tablet Take 20 mg by mouth daily. Every other day 10/17/21   [provider]  lisinopril (ZESTRIL) 20 MG tablet Take 20 mg by mouth daily.    [provider]  memantine (NAMENDA) 10 MG tablet Take 1 tablet (10 mg total) by mouth at bedtime. Patient taking differently: Take 10 mg by mouth at bedtime. Taking 1 in am and 1 at bedtime. 11/26/22   Lomax, Amy, NP  omeprazole (PRILOSEC) 20 MG capsule TAKE 1 CAPSULE BY MOUTH EVERY DAY 11/30/22   Loyola Mast, MD  QUEtiapine (SEROQUEL) 25 MG tablet TAKE 1 TABLET BY MOUTH TWICE A DAY 05/01/22   Loyola Mast, MD      Allergies    Iodinated contrast media, Cedar, and Diphenhydramine hcl    Review of Systems   Review of Systems  All other systems reviewed and are negative.   Physical Exam Updated Vital Signs BP (!) 150/73 (BP Location: Left  Arm)   Pulse 60   Temp 98.3 F (36.8 C) (Oral)   Resp 18   Ht 1.575 m (5\' 2" )   Wt 70 kg   SpO2 97%   BMI 28.23 kg/m  Physical Exam Vitals and nursing note reviewed.  Constitutional:      General: She is not in acute distress.    Appearance: Normal appearance. She is well-developed. She is not toxic-appearing.  HENT:     Head: Normocephalic and atraumatic.  Eyes:     General: Lids are normal.     Conjunctiva/sclera: Conjunctivae normal.     Pupils: Pupils are equal, round, and reactive to light.  Neck:     Thyroid: No thyroid mass.     Trachea: No tracheal deviation.  Cardiovascular:     Rate and Rhythm: Normal rate and regular rhythm.     Heart sounds: Normal heart sounds. No murmur heard.    No gallop.  Pulmonary:     Effort: Pulmonary effort is normal. No respiratory distress.     Breath sounds: Normal breath sounds. No stridor. No decreased breath sounds, wheezing, rhonchi or rales.  Abdominal:     General: There is no distension.     Palpations: Abdomen is soft.     Tenderness: There is no abdominal tenderness. There is no rebound.  Musculoskeletal:  General: No tenderness. Normal range of motion.     Cervical back: Normal range of motion and neck supple.  Skin:    General: Skin is warm and dry.     Findings: No abrasion or rash.  Neurological:     General: No focal deficit present.     Mental Status: She is alert and oriented to person, place, and time. Mental status is at baseline.     GCS: GCS eye subscore is 4. GCS verbal subscore is 5. GCS motor subscore is 6.     Cranial Nerves: No cranial nerve deficit.     Sensory: No sensory deficit.     Motor: Motor function is intact.  Psychiatric:        Attention and Perception: Attention normal.        Speech: Speech normal.        Behavior: Behavior normal.     ED Results / Procedures / Treatments   Labs (all labs ordered are listed, but only abnormal results are displayed) Labs Reviewed  CBC WITH  DIFFERENTIAL/PLATELET  COMPREHENSIVE METABOLIC PANEL  URINALYSIS, ROUTINE W REFLEX MICROSCOPIC    EKG None  Radiology No results found.  Procedures Procedures    Medications Ordered in ED Medications  lactated ringers infusion (has no administration in time range)    ED Course/ Medical Decision Making/ A&P Clinical Course as of 01/15/23 1308  Wed Jan 13, 2023  1512 Getting MRI brain to evaluate for stroke, if positive admit. Negative, outpatient follow up.  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  She has been having waxing and waning behavioral and mental status changes but is alert oriented x 4 and acting appropriately on my exam.  I suspect is likely due to underlying visual changes and loss.  Her labs here have been unremarkable as well as urine no UTI.  Discussed with patient and patient's husband MRI brain with no evidence of stroke and unchanged pineal lesion.  Advise close follow-up with PCP and neurologist.  Did discuss that there would be no medical indication to keep her overnight in the hospital and likely would lead to worsening confusion and delirium changes which they are in agreement with. [VB]    Clinical Course User Index [VB] Mardene Sayer, MD                             Medical Decision Making Amount and/or Complexity of Data Reviewed Labs: ordered. Radiology: ordered. ECG/medicine tests: ordered.  Risk Prescription drug management.   Work up is pending at this time and signed out to next provider        Final Clinical Impression(s) / ED Diagnoses Final diagnoses:  None    Rx / DC Orders ED Discharge Orders     None         Lorre Nick, MD 01/15/23 1308

## 2023-01-13 NOTE — Discharge Instructions (Signed)
As we discussed, your blood work, EKG, urine and MRI of the brain were overall reassuring but did not show any concerning reasons to keep you in the hospital or treat medically at this time.  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  Please follow-up with your primary care doctor regarding your visit to the ER today as well as neurologist.  As we discussed, you can try audiobooks at home and podcasts.  Come back if any concerning weakness in 1 arm or leg, severe headache, slurred speech, facial droop, severe chest pain, or any other symptoms concerning to you.

## 2023-01-13 NOTE — ED Provider Notes (Signed)
Clinical Course as of 01/13/23 1708  Wed Jan 13, 2023  1512 Getting MRI brain to evaluate for stroke, if positive admit. Negative, outpatient follow up.  [VB]  6939 82 year old female with history of Maureen Ralphs syndrome and impaired vision.  She has been having waxing and waning behavioral and mental status changes but is alert oriented x 4 and acting appropriately on my exam.  I suspect is likely due to underlying visual changes and loss.  Her labs here have been unremarkable as well as urine no UTI.  Discussed with patient and patient's husband MRI brain with no evidence of stroke and unchanged pineal lesion.  Advise close follow-up with PCP and neurologist.  Did discuss that there would be no medical indication to keep her overnight in the hospital and likely would lead to worsening confusion and delirium changes which they are in agreement with. [VB]    Clinical Course User Index [VB] Mardene Sayer, MD      Mardene Sayer, MD 01/13/23 [phone]

## 2023-01-13 NOTE — Telephone Encounter (Signed)
Pts husband called to say she is being admitted into the hospital. He is requesting for her to have 2 weeks rehab when she is discharged. He would like a call.

## 2023-01-14 ENCOUNTER — Telehealth: Payer: Self-pay | Admitting: Family Medicine

## 2023-01-14 NOTE — Telephone Encounter (Signed)
Pt's husband returned your call.

## 2023-01-14 NOTE — Telephone Encounter (Signed)
LVM to return call.

## 2023-01-15 NOTE — Telephone Encounter (Signed)
I called and spoke with patient's husband [name] and he said that he has made his wife a hospital follow up visit for this Monday, June 24th to see Dr. Veto Kemps and will discuss everything with him then.

## 2023-01-15 NOTE — Telephone Encounter (Signed)
LVM to return call.

## 2023-01-15 NOTE — Telephone Encounter (Signed)
Pt's husband returning call and would like a cb.

## 2023-01-18 ENCOUNTER — Ambulatory Visit (INDEPENDENT_AMBULATORY_CARE_PROVIDER_SITE_OTHER): Payer: Medicare HMO | Admitting: Family Medicine

## 2023-01-18 ENCOUNTER — Encounter: Payer: Self-pay | Admitting: Family Medicine

## 2023-01-18 VITALS — BP 126/70 | HR 57 | Temp 98.3°F | Ht 62.0 in | Wt 149.4 lb

## 2023-01-18 DIAGNOSIS — G309 Alzheimer's disease, unspecified: Secondary | ICD-10-CM

## 2023-01-18 DIAGNOSIS — F02A0 Dementia in other diseases classified elsewhere, mild, without behavioral disturbance, psychotic disturbance, mood disturbance, and anxiety: Secondary | ICD-10-CM

## 2023-01-18 DIAGNOSIS — I10 Essential (primary) hypertension: Secondary | ICD-10-CM | POA: Diagnosis not present

## 2023-01-18 DIAGNOSIS — W108XXD Fall (on) (from) other stairs and steps, subsequent encounter: Secondary | ICD-10-CM | POA: Diagnosis not present

## 2023-01-18 DIAGNOSIS — H5316 Psychophysical visual disturbances: Secondary | ICD-10-CM | POA: Diagnosis not present

## 2023-01-18 NOTE — Assessment & Plan Note (Signed)
Stable. Managing anxiety associated with visual hallucinations better on quetiapine 25 mg bid. MS. Schnurr had been referred to a different neurologist in [place], seeking expertise for her issue. She has been unable to find anyone locally that has such expertise. I will try referring her to [facility].

## 2023-01-18 NOTE — Assessment & Plan Note (Signed)
Stable. Continue memantine 10 mg bid.

## 2023-01-18 NOTE — Progress Notes (Signed)
[facility] PR[facility]ARE [facility] [address] [place] [address] Dept: [phone] Dept Fax: [phone]  Office Visit  Subjective:    Patient ID: Hassan Rowan, female    DOB: [birth year], 82 y.o..   MRN: [id number]  Chief Complaint  Patient presents with   Hospitalization Follow-up    Hospital f/u from 01/13/23 for behavorial changes.    History of Present Illness:  Patient is in today for follow-up from a recent fall. She was seen at [facility] on 01/13/2023. She had a fall at home several days prior to this. Ms. Charo notes she only part-way stepped on a stair tread. She feel over onto the steps and had some minor abrasions. Mr. Colebank heard the fall and responded quickly. She did not hit her head and had no LOC. Over the next few days, Mr. Moger felt his wife was having increasing episodes of confusion. He became concerned about possible head injury or stroke, so took her to the ED. Since that visit, she ahs been stable. She is not showing any worsening confusion.  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   Macular degeneration of both eyes 08/11/2018   Essential hypertension 07/03/2014   History of renal artery stenosis 06/14/2014   Squamous cell carcinoma in situ of skin 02/08/2013   Dysplastic nevus of trunk 01/23/2013   Contact dermatitis and eczema 10/31/2012   Osteopenia 09/29/2007   Allergic rhinitis 02/24/2007   Generalized OA 02/24/2007   Past Surgical History:  Procedure Laterality Date   ANGIOPLASTY     right renal 1981   BTL      cb x 3     HEMORRHOID SURGERY     IR PTA RENAL VISCERAL  05/11/2017   IR RADIOLOGIST EVAL & MGMT  04/21/2017   IR RENAL BILAT S&I MOD SED  05/11/2017   IR US GUIDE VASC ACCESS RIGHT  05/11/2017   Rt ovarian cyst removed     TONSILLECTOMY AND ADENOIDECTOMY     Family History  Problem Relation Age of Onset   Diabetes Mother    Hypertension Mother    Stroke Brother    Diabetes Maternal Aunt    Outpatient Medications Prior to Visit  Medication Sig Dispense Refill   acetaminophen (TYLENOL) 500 MG tablet Take 1,000 mg by mouth at bedtime.     Bevacizumab (AVASTIN) 100 MG/4ML SOLN Inject 1-10 mg into the eye See admin instructions. For eyes One every 6 weeks 1/10 mg     furosemide (LASIX) 20 MG tablet Take 20 mg by mouth daily. Every other day     lisinopril (ZESTRIL) 20 MG tablet Take 20 mg by mouth daily.     memantine (NAMENDA) 10 MG tablet Take 1 tablet (10 mg total) by mouth at bedtime. (Patient taking differently: Take 10 mg by mouth at bedtime. Taking 1 in am and 1 at bedtime.) 180 tablet 3   omeprazole (PRILOSEC) 20 MG capsule TAKE 1 CAPSULE BY MOUTH EVERY DAY 90 capsule 2   QUEtiapine (SEROQUEL) 25 MG tablet TAKE 1 TABLET BY MOUTH TWICE A DAY 180 tablet 2   No  facility-administered medications prior to visit.   Allergies  Allergen Reactions   Iodinated Contrast Media Hives    Patient believes this was an older form of iodinated contrast.  Tolerated OMNIPAQUE IOHEXOL 11/19/14 for renal arteriogram/angioplasty without prep. Larina Earthly, RN   Cyd Silence Other (See Comments)    unknown   Diphenhydramine Hcl Hives     Objective:   Today's Vitals   01/18/23 1058  BP: 126/70  Pulse: (!) 57  Temp: 98.3 F (36.8 C)  TempSrc: Temporal  SpO2: 98%  Weight: 149 lb 6.4 oz (67.8 kg)  Height: 5\' 2"  (1.575 m)   Body mass index is 27.33 kg/m.   General: Well developed, well nourished. No acute distress. Psych: Alert and oriented. Normal mood and affect.  There are no preventive care  reminders to display for this patient.  Lab Results    Latest Ref Rng & Units 01/13/2023   11:42 AM 08/05/2022   10:04 AM 08/13/2021   10:35 AM  CBC  WBC 4.0 - 10.5 K/uL 4.1  4.2  6.3   Hemoglobin 12.0 - 15.0 g/dL 36.6  44.0  34.7   Hematocrit 36.0 - 46.0 % 39.8  41.4  36.1   Platelets 150 - 400 K/uL 177  200.0  285.0        Latest Ref Rng & Units 01/13/2023   11:42 AM 08/05/2022   10:04 AM 08/13/2021   10:35 AM  CMP  Glucose 70 - 99 mg/dL 425  99  956   BUN 8 - 23 mg/dL 16  16  11    Creatinine 0.44 - 1.00 mg/dL 3.87  5.64  3.32   Sodium 135 - 145 mmol/L 140  146  142   Potassium 3.5 - 5.1 mmol/L 3.8  3.7  3.6   Chloride 98 - 111 mmol/L 108  106  108   CO2 22 - 32 mmol/L 27  29  28    Calcium 8.9 - 10.3 mg/dL 9.8  9.9  9.3   Total Protein 6.5 - 8.1 g/dL 6.7     Total Bilirubin 0.3 - 1.2 mg/dL 0.8     Alkaline Phos 38 - 126 U/L 69     AST 15 - 41 U/L 16     ALT 0 - 44 U/L 11      Imaging: CT of Head wo contrast (01/13/2023) IMPRESSION: 1. No acute intracranial hemorrhage. 2. Possible age indeterminate infarct in the left thalamus. If there is clinical concern for an acute infarct, consider further evaluation with MRI.  MR Brain wo contrast (01/13/2023) IMPRESSION: 1. No evidence of acute intracranial abnormality. 2. Similar probable meningioma posterior to the pineal gland.    Assessment & Plan:   Problem List Items Addressed This Visit       Cardiovascular and Mediastinum   Essential hypertension    Blood pressure is in good control. Continue lisinopril 20 mg daily and furosemide 20 mg every other day.        Nervous and Auditory   Maureen Ralphs syndrome    Stable. Managing anxiety associated with visual hallucinations better on quetiapine 25 mg bid. MS. Ohlin had been referred to a different neurologist in [place], seeking expertise for her issue. She has been unable to find anyone locally that has such expertise. I will try referring her to [facility].       Relevant Orders   Ambulatory referral to Neurology   Dementia (HCC)    Stable. Continue memantine 10 mg bid.  Relevant Orders   Ambulatory referral to Neurology   Other Visit Diagnoses     Fall (on) (from) other stairs and steps, subsequent encounter    -  Primary   Reviewed ED notes, labs, and imaging reports. Ms. Fronczak exam remains normal. No sign of significant injury.       Return in about 3 months (around 04/20/2023).   Loyola Mast, MD

## 2023-01-18 NOTE — Assessment & Plan Note (Addendum)
Blood pressure is in good control. Continue lisinopril 20 mg daily and furosemide 20 mg every other day.

## 2023-01-20 ENCOUNTER — Other Ambulatory Visit: Payer: Self-pay | Admitting: Family Medicine

## 2023-01-20 DIAGNOSIS — H5316 Psychophysical visual disturbances: Secondary | ICD-10-CM

## 2023-02-01 ENCOUNTER — Encounter: Payer: Self-pay | Admitting: Family Medicine

## 2023-02-01 NOTE — Telephone Encounter (Signed)
Amy it sounds like patient is having a hard time take memantine to 10mg  twice daily?   Husband is asking about increasing dose and about senior living?

## 2023-02-22 MED ORDER — MEMANTINE HCL 10 MG PO TABS: 10.0000 mg | ORAL_TABLET | Freq: Two times a day (BID) | ORAL | 0 refills | Status: AC

## 2023-03-11 ENCOUNTER — Encounter (INDEPENDENT_AMBULATORY_CARE_PROVIDER_SITE_OTHER): Payer: Self-pay

## 2023-03-23 ENCOUNTER — Telehealth: Payer: Self-pay

## 2023-03-23 NOTE — Telephone Encounter (Signed)
Call to patient and husband, advised prescription was sent to [facility] on [address]. He states the pharmacy didn't make them aware. Appreciative of call.

## 2023-04-20 ENCOUNTER — Ambulatory Visit (INDEPENDENT_AMBULATORY_CARE_PROVIDER_SITE_OTHER): Payer: Medicare HMO | Admitting: Family Medicine

## 2023-04-20 ENCOUNTER — Encounter: Payer: Self-pay | Admitting: Family Medicine

## 2023-04-20 VITALS — BP 116/62 | HR 63 | Temp 97.3°F | Ht 62.0 in | Wt 151.4 lb

## 2023-04-20 DIAGNOSIS — F02A0 Dementia in other diseases classified elsewhere, mild, without behavioral disturbance, psychotic disturbance, mood disturbance, and anxiety: Secondary | ICD-10-CM

## 2023-04-20 DIAGNOSIS — Z23 Encounter for immunization: Secondary | ICD-10-CM

## 2023-04-20 DIAGNOSIS — I10 Essential (primary) hypertension: Secondary | ICD-10-CM | POA: Diagnosis not present

## 2023-04-20 DIAGNOSIS — G309 Alzheimer's disease, unspecified: Secondary | ICD-10-CM | POA: Diagnosis not present

## 2023-04-20 DIAGNOSIS — N3000 Acute cystitis without hematuria: Secondary | ICD-10-CM | POA: Diagnosis not present

## 2023-04-20 LAB — POCT URINALYSIS DIPSTICK
Bilirubin, UA: NEGATIVE
Glucose, UA: NEGATIVE
Ketones, UA: 1.005
Nitrite, UA: NEGATIVE
Protein, UA: POSITIVE — AB
Spec Grav, UA: 1.01 (ref 1.010–1.025)
Urobilinogen, UA: >=8 E.U./dL — AB
pH, UA: 5 (ref 5.0–8.0)

## 2023-04-20 MED ORDER — SULFAMETHOXAZOLE-TRIMETHOPRIM 800-160 MG PO TABS
1.0000 | ORAL_TABLET | Freq: Two times a day (BID) | ORAL | 0 refills | Status: DC
Start: 2023-04-20 — End: 2023-05-18

## 2023-04-20 NOTE — Assessment & Plan Note (Signed)
Blood pressure is in good control. Continue lisinopril 20 mg daily and furosemide 20 mg every other day.

## 2023-04-20 NOTE — Assessment & Plan Note (Signed)
Appears to be mildly progressive. Continue memantine 10 mg bid. I support the decision to look into assisted living options.

## 2023-04-20 NOTE — Progress Notes (Addendum)
[facility] PRIMARY CARE [facility] PRIMARY Trecia [facility] [address] [place] [address] Dept: [phone] Dept Fax: [phone]  Chronic Care Office Visit  Subjective:    Patient ID: Michelle [name], female    DOB: 1941/07/17, 82 y.o..   MRN: [id number]  Chief Complaint  Patient presents with   Follow-up    3 month f/u. Memory going down an looking into changing into Assisted Living for help. Also c/o having burning with urination x 2 weeks.    History of Present Illness:  Patient is in today for reassessment of chronic medical issues.  Ms. Michelle [name] has a history of macular degeneration with visual loss. The vision loss has resulted in [facility] Syndrome, in which she has visual hallucinations. Ms. Michelle [name] was previously identified with mild cognitive impairment, identified by Dr. Pearlean [name] (neurology). Her husband has noted progressive issues with disorientation. She is currently managed on memantine 10 mg bid. Ms. Michelle [name] can still care for her basic ADLs. Her dependence on her husband's help for her IADLs is more related to her vision loss. Ms. Michelle [name] has been on quetiapine 25 mg twice daily for help with anxiety/fear related to her hallucinations. This continues to help. Her husband notes she is tending to eat less and sleep more in recent weeks. She complains of some dysuria over this time as well. They are looking into moving to an assisted living facility, as it is getting harder for both of them to care for themselves at home.   Ms. Michelle [name] has a history of hypertension. She is currently managed on lisinopril 20 mg daily and furosemide 20 mg every other daily.   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/2018   Macular degeneration of both eyes 08/11/2018   Essential hypertension 07/03/2014   History of renal artery stenosis 06/14/2014   Squamous cell carcinoma in situ of skin 02/08/2013   Dysplastic nevus of trunk 01/23/2013   Contact dermatitis and eczema 10/31/2012   Osteopenia 09/29/2007   Allergic rhinitis 02/24/2007   Generalized OA 02/24/2007   Past Surgical History:  Procedure Laterality Date   ANGIOPLASTY     right renal 1981   BTL     cb x 3     HEMORRHOID SURGERY     IR PTA RENAL VISCERAL  05/11/2017   IR RADIOLOGIST EVAL & MGMT  04/21/2017   IR RENAL BILAT S&I MOD SED  05/11/2017   IR US GUIDE VASC ACCESS RIGHT  05/11/2017   Rt ovarian cyst removed     TONSILLECTOMY AND ADENOIDECTOMY     Family History  Problem Relation Age of Onset   Diabetes Mother    Hypertension Mother    Stroke Brother    Diabetes Maternal Aunt    Outpatient Medications Prior to Visit  Medication Sig Dispense Refill   acetaminophen (TYLENOL) 500 MG tablet Take 1,000 mg by mouth at bedtime.     Bevacizumab (AVASTIN) 100 MG/4ML SOLN Inject 1-10 mg into the eye See admin instructions. For eyes One every 6 weeks 1/10 mg     furosemide (LASIX) 20 MG tablet Take 20 mg by mouth daily.  Every other day     lisinopril (ZESTRIL) 20 MG tablet Take 20 mg by mouth daily.     memantine (NAMENDA) 10 MG tablet Take 1 tablet (10 mg total) by mouth 2 (two) times daily. 180 tablet 0   omeprazole (PRILOSEC) 20 MG capsule TAKE 1 CAPSULE BY MOUTH EVERY DAY 90 capsule 2   QUEtiapine (SEROQUEL) 25 MG tablet TAKE 1 TABLET BY MOUTH TWICE A DAY 180 tablet 3   No facility-administered medications prior to visit.   Allergies  Allergen Reactions   Iodinated Contrast Media Hives    Patient believes this was an older form of iodinated contrast.  Tolerated OMNIPAQUE IOHEXOL 11/19/14 for  renal arteriogram/angioplasty without prep. Michelle Earthly, RN   Michelle [name] Other (See Comments)    unknown   Diphenhydramine Hcl Hives   Objective:   Today's Vitals   04/20/23 1027  BP: 116/62  Pulse: 63  Temp: (!) 97.3 F (36.3 C)  TempSrc: Temporal  SpO2: 94%  Weight: 151 lb 6.4 oz (68.7 kg)  Height: 5\' 2"  (1.575 m)   Body mass index is 27.69 kg/m.   General: Well developed, well nourished. No acute distress. Psych: Alert and oriented. Normal mood and affect. Speech pattern appears to be a bit circular or meandering   without always having a point.   Health Maintenance Due  Topic Date Due   INFLUENZA VACCINE  02/25/2023   Medicare Annual Wellness (AWV)  06/02/2023     Lab Results: Urine dipstick shows negative for nitrites, glucose, ketones, urobilinogen, positive for leukocytes, protein.  Assessment & Plan:   Problem List Items Addressed This Visit       Cardiovascular and Mediastinum   Essential hypertension    Blood pressure is in good control. Continue lisinopril 20 mg daily and furosemide 20 mg every other day.        Nervous and Auditory   Dementia (HCC)    Appears to be mildly progressive. Continue memantine 10 mg bid. I support the decision to look into assisted living options.      Other Visit Diagnoses     Acute cystitis without hematuria    -  Primary   Relevant Medications   sulfamethoxazole-trimethoprim (BACTRIM DS) 800-160 MG tablet   Other Relevant Orders   POCT Urinalysis Dipstick (Completed)   Need for immunization against influenza       Relevant Orders   Flu Vaccine Trivalent High Dose (Fluad) (Completed)       Return in about 3 months (around 07/20/2023) for Reassessment.   Michelle Mast, MD

## 2023-05-18 ENCOUNTER — Encounter: Payer: Self-pay | Admitting: Family Medicine

## 2023-05-18 ENCOUNTER — Ambulatory Visit (INDEPENDENT_AMBULATORY_CARE_PROVIDER_SITE_OTHER): Payer: Medicare HMO | Admitting: Family Medicine

## 2023-05-18 VITALS — BP 116/64 | HR 66 | Temp 97.6°F | Ht 62.0 in | Wt 157.8 lb

## 2023-05-18 DIAGNOSIS — I10 Essential (primary) hypertension: Secondary | ICD-10-CM

## 2023-05-18 DIAGNOSIS — Z111 Encounter for screening for respiratory tuberculosis: Secondary | ICD-10-CM

## 2023-05-18 DIAGNOSIS — G309 Alzheimer's disease, unspecified: Secondary | ICD-10-CM

## 2023-05-18 DIAGNOSIS — F02A0 Dementia in other diseases classified elsewhere, mild, without behavioral disturbance, psychotic disturbance, mood disturbance, and anxiety: Secondary | ICD-10-CM | POA: Diagnosis not present

## 2023-05-18 NOTE — Assessment & Plan Note (Signed)
Mildly progressive. Continue memantine 10 mg bid. I support the decision to look into assisted living options. I completed FL2 and other forms to assist.

## 2023-05-18 NOTE — Assessment & Plan Note (Signed)
Blood pressure is in good control. Continue lisinopril 20 mg daily and furosemide 20 mg every other day.

## 2023-05-18 NOTE — Progress Notes (Signed)
[facility] PRIMARY CARE [facility] PRIMARY Trecia Rogers [address] [place] [address] Dept: [phone] Dept Fax: [phone]  Chronic Care Office Visit  Subjective:    Patient ID: Michelle [name], female    DOB: Aug 29, 1940, 82 y.o..   MRN: [id number]  Chief Complaint  Patient presents with   Discuss placement in assisted living   History of Present Illness:  Patient is in today for reassessment of chronic medical issues.  Michelle [name] has a history of macular degeneration with visual loss. The vision loss has resulted in [facility] Syndrome, in which she has visual hallucinations. Michelle [name] was previously identified with mild cognitive impairment, identified by Michelle [name] (neurology).This appears to be progressing to mild dementia. Her husband has noted progressive issues with disorientation. She is currently managed on memantine 10 mg bid. Michelle [name] can still care for her basic ADLs. Her dependence on her husband's help for her IADLs is more related to her vision loss. Michelle [name] has been on quetiapine 25 mg twice daily for help with anxiety/fear related to her hallucinations. This continues to help. They have decided to move into [facility] in [place].    Michelle [name] has a history of hypertension. She is currently managed on lisinopril 20 mg daily and furosemide 20 mg every other daily.   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   Macular degeneration of both eyes 08/11/2018   Essential hypertension 07/03/2014   History of renal artery stenosis  06/14/2014   Squamous cell carcinoma in situ of skin 02/08/2013   Dysplastic nevus of trunk 01/23/2013   Contact dermatitis and eczema 10/31/2012   Osteopenia 09/29/2007   Allergic rhinitis 02/24/2007   Generalized OA 02/24/2007   Past Surgical History:  Procedure Laterality Date   ANGIOPLASTY     right renal 1981   BTL     cb x 3     HEMORRHOID SURGERY     IR PTA RENAL VISCERAL  05/11/2017   IR RADIOLOGIST EVAL & MGMT  04/21/2017   IR RENAL BILAT S&I MOD SED  05/11/2017   IR US GUIDE VASC ACCESS RIGHT  05/11/2017   Rt ovarian cyst removed     TONSILLECTOMY AND ADENOIDECTOMY     Family History  Problem Relation Age of Onset   Diabetes Mother    Hypertension Mother    Stroke Brother    Diabetes Maternal Aunt    Outpatient Medications Prior to Visit  Medication Sig Dispense Refill   acetaminophen (TYLENOL) 500 MG tablet Take 1,000 mg by mouth at bedtime.     Bevacizumab (AVASTIN) 100 MG/4ML SOLN Inject 1-10 mg into the eye See admin instructions. For eyes One every 6 weeks 1/10 mg     furosemide (LASIX) 20 MG tablet Take 20 mg by mouth daily. Every other day     lisinopril (ZESTRIL) 20 MG tablet Take 20 mg by mouth daily.     memantine (NAMENDA) 10 MG tablet Take 1 tablet (10 mg total) by mouth 2 (two) times daily. 180 tablet 0   omeprazole (PRILOSEC) 20 MG capsule TAKE 1 CAPSULE BY MOUTH  EVERY DAY 90 capsule 2   QUEtiapine (SEROQUEL) 25 MG tablet TAKE 1 TABLET BY MOUTH TWICE A DAY 180 tablet 3   sulfamethoxazole-trimethoprim (BACTRIM DS) 800-160 MG tablet Take 1 tablet by mouth 2 (two) times daily. 6 tablet 0   No facility-administered medications prior to visit.   Allergies  Allergen Reactions   Iodinated Contrast Media Hives    Patient believes this was an older form of iodinated contrast.  Tolerated OMNIPAQUE IOHEXOL 11/19/14 for renal arteriogram/angioplasty without prep. Larina Earthly, RN   Cyd Silence Other (See Comments)    unknown   Diphenhydramine Hcl Hives   Objective:    Today's Vitals   05/18/23 1521  BP: 116/64  Pulse: 66  Temp: 97.6 F (36.4 C)  TempSrc: Temporal  SpO2: 97%  Weight: 157 lb 12.8 oz (71.6 kg)  Height: 5\' 2"  (1.575 m)   Body mass index is 28.86 kg/m.   General: Well developed, well nourished. No acute distress. Psych: Alert and oriented. Normal mood and affect.  Health Maintenance Due  Topic Date Due   Medicare Annual Wellness (AWV)  06/02/2023     Assessment & Plan:   Problem List Items Addressed This Visit       Cardiovascular and Mediastinum   Essential hypertension    Blood pressure is in good control. Continue lisinopril 20 mg daily and furosemide 20 mg every other day.        Nervous and Auditory   Dementia (HCC) - Primary    Mildly progressive. Continue memantine 10 mg bid. I support the decision to look into assisted living options. I completed FL2 and other forms to assist.      Relevant Orders   QuantiFERON-TB Gold Plus   Other Visit Diagnoses     Screening for tuberculosis       Quantiferon drawn as required for assisted living palcement.   Relevant Orders   QuantiFERON-TB Gold Plus       Return in about 3 months (around 08/18/2023) for Reassessment.   Loyola Mast, MD

## 2023-05-21 ENCOUNTER — Telehealth: Payer: Self-pay

## 2023-05-21 LAB — QUANTIFERON-TB GOLD PLUS
Mitogen-NIL: 7.26 [IU]/mL
NIL: 0.01 [IU]/mL
QuantiFERON-TB Gold Plus: NEGATIVE
TB1-NIL: 0 [IU]/mL
TB2-NIL: 0 [IU]/mL

## 2023-05-21 NOTE — Telephone Encounter (Signed)
FL2 form filled out and original place up front for pick up. Patient's husband, Leonette Most notified VIA phone. Dm/cma

## 2023-06-02 DIAGNOSIS — H5316 Psychophysical visual disturbances: Secondary | ICD-10-CM | POA: Diagnosis not present

## 2023-06-02 DIAGNOSIS — Z87440 Personal history of urinary (tract) infections: Secondary | ICD-10-CM

## 2023-06-02 DIAGNOSIS — I129 Hypertensive chronic kidney disease with stage 1 through stage 4 chronic kidney disease, or unspecified chronic kidney disease: Secondary | ICD-10-CM | POA: Diagnosis not present

## 2023-06-02 DIAGNOSIS — F0390 Unspecified dementia without behavioral disturbance: Secondary | ICD-10-CM

## 2023-06-02 DIAGNOSIS — K219 Gastro-esophageal reflux disease without esophagitis: Secondary | ICD-10-CM | POA: Diagnosis not present

## 2023-06-02 DIAGNOSIS — N183 Chronic kidney disease, stage 3 unspecified: Secondary | ICD-10-CM | POA: Diagnosis not present

## 2023-06-02 DIAGNOSIS — M6281 Muscle weakness (generalized): Secondary | ICD-10-CM

## 2023-06-03 ENCOUNTER — Ambulatory Visit: Payer: Medicare HMO | Admitting: Family Medicine

## 2023-06-04 ENCOUNTER — Encounter (INDEPENDENT_AMBULATORY_CARE_PROVIDER_SITE_OTHER): Payer: Medicare HMO | Admitting: Ophthalmology

## 2023-06-08 ENCOUNTER — Telehealth: Payer: Self-pay | Admitting: Internal Medicine

## 2023-06-08 NOTE — Telephone Encounter (Signed)
FYI:Pt's spouse, Leonette Most wants Dr Veto Kemps to know-Michelle [name] has been placed in Hospice care. A provider there will start refilling her scripts.

## 2023-06-09 DIAGNOSIS — N1831 Chronic kidney disease, stage 3a: Secondary | ICD-10-CM

## 2023-06-09 DIAGNOSIS — W19XXXA Unspecified fall, initial encounter: Secondary | ICD-10-CM

## 2023-06-09 DIAGNOSIS — E559 Vitamin D deficiency, unspecified: Secondary | ICD-10-CM

## 2023-06-09 DIAGNOSIS — R2689 Other abnormalities of gait and mobility: Secondary | ICD-10-CM | POA: Diagnosis not present

## 2023-06-09 DIAGNOSIS — F0390 Unspecified dementia without behavioral disturbance: Secondary | ICD-10-CM | POA: Diagnosis not present

## 2023-06-09 DIAGNOSIS — M6281 Muscle weakness (generalized): Secondary | ICD-10-CM | POA: Diagnosis not present

## 2023-06-09 DIAGNOSIS — R451 Restlessness and agitation: Secondary | ICD-10-CM

## 2023-06-17 DIAGNOSIS — H5316 Psychophysical visual disturbances: Secondary | ICD-10-CM | POA: Diagnosis not present

## 2023-06-17 DIAGNOSIS — Z9181 History of falling: Secondary | ICD-10-CM

## 2023-06-17 DIAGNOSIS — F0390 Unspecified dementia without behavioral disturbance: Secondary | ICD-10-CM | POA: Diagnosis not present

## 2023-06-17 DIAGNOSIS — W19XXXA Unspecified fall, initial encounter: Secondary | ICD-10-CM | POA: Diagnosis not present

## 2023-06-17 DIAGNOSIS — M6281 Muscle weakness (generalized): Secondary | ICD-10-CM | POA: Diagnosis not present

## 2023-07-05 DIAGNOSIS — Z9181 History of falling: Secondary | ICD-10-CM | POA: Diagnosis not present

## 2023-07-05 DIAGNOSIS — M6281 Muscle weakness (generalized): Secondary | ICD-10-CM | POA: Diagnosis not present

## 2023-07-05 DIAGNOSIS — W19XXXA Unspecified fall, initial encounter: Secondary | ICD-10-CM

## 2023-07-05 DIAGNOSIS — R2689 Other abnormalities of gait and mobility: Secondary | ICD-10-CM | POA: Diagnosis not present

## 2023-07-05 DIAGNOSIS — F0390 Unspecified dementia without behavioral disturbance: Secondary | ICD-10-CM | POA: Diagnosis not present

## 2023-07-09 DIAGNOSIS — F0390 Unspecified dementia without behavioral disturbance: Secondary | ICD-10-CM | POA: Diagnosis not present

## 2023-07-09 DIAGNOSIS — Z9181 History of falling: Secondary | ICD-10-CM

## 2023-07-09 DIAGNOSIS — R2689 Other abnormalities of gait and mobility: Secondary | ICD-10-CM | POA: Diagnosis not present

## 2023-07-09 DIAGNOSIS — G4700 Insomnia, unspecified: Secondary | ICD-10-CM | POA: Diagnosis not present

## 2023-07-09 DIAGNOSIS — M6281 Muscle weakness (generalized): Secondary | ICD-10-CM | POA: Diagnosis not present

## 2023-07-09 DIAGNOSIS — W19XXXA Unspecified fall, initial encounter: Secondary | ICD-10-CM

## 2023-07-14 ENCOUNTER — Encounter (INDEPENDENT_AMBULATORY_CARE_PROVIDER_SITE_OTHER): Payer: Medicare HMO | Admitting: Ophthalmology

## 2023-07-22 DIAGNOSIS — R634 Abnormal weight loss: Secondary | ICD-10-CM | POA: Diagnosis not present

## 2023-07-22 DIAGNOSIS — R630 Anorexia: Secondary | ICD-10-CM | POA: Diagnosis not present

## 2023-07-22 DIAGNOSIS — F0390 Unspecified dementia without behavioral disturbance: Secondary | ICD-10-CM | POA: Diagnosis not present

## 2023-08-02 DIAGNOSIS — F0390 Unspecified dementia without behavioral disturbance: Secondary | ICD-10-CM | POA: Diagnosis not present

## 2023-08-02 DIAGNOSIS — Z9181 History of falling: Secondary | ICD-10-CM | POA: Diagnosis not present

## 2023-08-02 DIAGNOSIS — R2689 Other abnormalities of gait and mobility: Secondary | ICD-10-CM | POA: Diagnosis not present

## 2023-08-02 DIAGNOSIS — M6281 Muscle weakness (generalized): Secondary | ICD-10-CM | POA: Diagnosis not present

## 2023-08-20 DIAGNOSIS — R634 Abnormal weight loss: Secondary | ICD-10-CM | POA: Diagnosis not present

## 2023-08-20 DIAGNOSIS — F0390 Unspecified dementia without behavioral disturbance: Secondary | ICD-10-CM | POA: Diagnosis not present

## 2023-08-25 DIAGNOSIS — M6281 Muscle weakness (generalized): Secondary | ICD-10-CM | POA: Diagnosis not present

## 2023-08-25 DIAGNOSIS — R600 Localized edema: Secondary | ICD-10-CM | POA: Diagnosis not present

## 2023-08-25 DIAGNOSIS — F0390 Unspecified dementia without behavioral disturbance: Secondary | ICD-10-CM | POA: Diagnosis not present

## 2023-08-26 DIAGNOSIS — R600 Localized edema: Secondary | ICD-10-CM | POA: Diagnosis not present

## 2023-08-26 DIAGNOSIS — F0390 Unspecified dementia without behavioral disturbance: Secondary | ICD-10-CM | POA: Diagnosis not present

## 2023-08-26 DIAGNOSIS — L03116 Cellulitis of left lower limb: Secondary | ICD-10-CM | POA: Diagnosis not present

## 2023-08-26 DIAGNOSIS — R634 Abnormal weight loss: Secondary | ICD-10-CM | POA: Diagnosis not present

## 2023-08-29 ENCOUNTER — Other Ambulatory Visit: Payer: Self-pay | Admitting: Family Medicine

## 2023-09-03 DIAGNOSIS — N390 Urinary tract infection, site not specified: Secondary | ICD-10-CM | POA: Diagnosis not present

## 2023-09-03 DIAGNOSIS — F0390 Unspecified dementia without behavioral disturbance: Secondary | ICD-10-CM | POA: Diagnosis not present

## 2023-09-03 DIAGNOSIS — Z87440 Personal history of urinary (tract) infections: Secondary | ICD-10-CM | POA: Diagnosis not present

## 2023-09-03 DIAGNOSIS — B9620 Unspecified Escherichia coli [E. coli] as the cause of diseases classified elsewhere: Secondary | ICD-10-CM | POA: Diagnosis not present

## 2023-09-13 DIAGNOSIS — R0989 Other specified symptoms and signs involving the circulatory and respiratory systems: Secondary | ICD-10-CM | POA: Diagnosis not present

## 2023-09-13 DIAGNOSIS — R059 Cough, unspecified: Secondary | ICD-10-CM | POA: Diagnosis not present

## 2023-09-13 DIAGNOSIS — J069 Acute upper respiratory infection, unspecified: Secondary | ICD-10-CM | POA: Diagnosis not present

## 2023-09-13 DIAGNOSIS — F0390 Unspecified dementia without behavioral disturbance: Secondary | ICD-10-CM | POA: Diagnosis not present

## 2023-09-23 DIAGNOSIS — E876 Hypokalemia: Secondary | ICD-10-CM | POA: Diagnosis not present

## 2023-09-23 DIAGNOSIS — K644 Residual hemorrhoidal skin tags: Secondary | ICD-10-CM

## 2023-09-23 DIAGNOSIS — M6281 Muscle weakness (generalized): Secondary | ICD-10-CM

## 2023-09-23 DIAGNOSIS — F419 Anxiety disorder, unspecified: Secondary | ICD-10-CM | POA: Diagnosis not present

## 2023-09-23 DIAGNOSIS — K5900 Constipation, unspecified: Secondary | ICD-10-CM

## 2023-09-23 DIAGNOSIS — R059 Cough, unspecified: Secondary | ICD-10-CM | POA: Diagnosis not present

## 2023-09-23 DIAGNOSIS — N390 Urinary tract infection, site not specified: Secondary | ICD-10-CM | POA: Diagnosis not present

## 2023-09-23 DIAGNOSIS — R451 Restlessness and agitation: Secondary | ICD-10-CM

## 2023-09-23 DIAGNOSIS — D649 Anemia, unspecified: Secondary | ICD-10-CM

## 2023-09-23 DIAGNOSIS — F03918 Unspecified dementia, unspecified severity, with other behavioral disturbance: Secondary | ICD-10-CM

## 2023-09-24 DIAGNOSIS — R2689 Other abnormalities of gait and mobility: Secondary | ICD-10-CM | POA: Diagnosis not present

## 2023-09-24 DIAGNOSIS — Z9181 History of falling: Secondary | ICD-10-CM | POA: Diagnosis not present

## 2023-09-24 DIAGNOSIS — M6281 Muscle weakness (generalized): Secondary | ICD-10-CM | POA: Diagnosis not present

## 2023-09-24 DIAGNOSIS — F0390 Unspecified dementia without behavioral disturbance: Secondary | ICD-10-CM | POA: Diagnosis not present

## 2023-10-01 DIAGNOSIS — R2689 Other abnormalities of gait and mobility: Secondary | ICD-10-CM

## 2023-10-01 DIAGNOSIS — E876 Hypokalemia: Secondary | ICD-10-CM | POA: Diagnosis not present

## 2023-10-01 DIAGNOSIS — D649 Anemia, unspecified: Secondary | ICD-10-CM | POA: Diagnosis not present

## 2023-10-01 DIAGNOSIS — F03918 Unspecified dementia, unspecified severity, with other behavioral disturbance: Secondary | ICD-10-CM

## 2023-10-01 DIAGNOSIS — N1831 Chronic kidney disease, stage 3a: Secondary | ICD-10-CM | POA: Diagnosis not present

## 2023-10-01 DIAGNOSIS — M6281 Muscle weakness (generalized): Secondary | ICD-10-CM | POA: Diagnosis not present

## 2023-10-04 DIAGNOSIS — R2689 Other abnormalities of gait and mobility: Secondary | ICD-10-CM | POA: Diagnosis not present

## 2023-10-04 DIAGNOSIS — M6281 Muscle weakness (generalized): Secondary | ICD-10-CM | POA: Diagnosis not present

## 2023-10-04 DIAGNOSIS — F0390 Unspecified dementia without behavioral disturbance: Secondary | ICD-10-CM | POA: Diagnosis not present

## 2023-10-04 DIAGNOSIS — Z9181 History of falling: Secondary | ICD-10-CM | POA: Diagnosis not present

## 2023-10-11 DIAGNOSIS — R040 Epistaxis: Secondary | ICD-10-CM | POA: Diagnosis not present

## 2023-10-11 DIAGNOSIS — N183 Chronic kidney disease, stage 3 unspecified: Secondary | ICD-10-CM | POA: Diagnosis not present

## 2023-10-11 DIAGNOSIS — F0390 Unspecified dementia without behavioral disturbance: Secondary | ICD-10-CM | POA: Diagnosis not present

## 2023-10-11 DIAGNOSIS — I129 Hypertensive chronic kidney disease with stage 1 through stage 4 chronic kidney disease, or unspecified chronic kidney disease: Secondary | ICD-10-CM | POA: Diagnosis not present

## 2023-10-20 DIAGNOSIS — F0390 Unspecified dementia without behavioral disturbance: Secondary | ICD-10-CM | POA: Diagnosis not present

## 2023-10-20 DIAGNOSIS — R634 Abnormal weight loss: Secondary | ICD-10-CM | POA: Diagnosis not present

## 2023-11-26 DIAGNOSIS — R630 Anorexia: Secondary | ICD-10-CM | POA: Diagnosis not present

## 2023-11-26 DIAGNOSIS — F03918 Unspecified dementia, unspecified severity, with other behavioral disturbance: Secondary | ICD-10-CM | POA: Diagnosis not present

## 2023-11-26 DIAGNOSIS — R634 Abnormal weight loss: Secondary | ICD-10-CM | POA: Diagnosis not present

## 2023-11-29 DIAGNOSIS — F03918 Unspecified dementia, unspecified severity, with other behavioral disturbance: Secondary | ICD-10-CM | POA: Diagnosis not present

## 2023-11-29 DIAGNOSIS — M6281 Muscle weakness (generalized): Secondary | ICD-10-CM | POA: Diagnosis not present

## 2023-11-29 DIAGNOSIS — S0101XA Laceration without foreign body of scalp, initial encounter: Secondary | ICD-10-CM

## 2023-11-29 DIAGNOSIS — Z9181 History of falling: Secondary | ICD-10-CM | POA: Diagnosis not present

## 2023-11-29 DIAGNOSIS — R2689 Other abnormalities of gait and mobility: Secondary | ICD-10-CM | POA: Diagnosis not present

## 2023-11-29 DIAGNOSIS — S0083XA Contusion of other part of head, initial encounter: Secondary | ICD-10-CM

## 2023-12-06 DIAGNOSIS — R2689 Other abnormalities of gait and mobility: Secondary | ICD-10-CM | POA: Diagnosis not present

## 2023-12-06 DIAGNOSIS — H5316 Psychophysical visual disturbances: Secondary | ICD-10-CM | POA: Diagnosis not present

## 2023-12-06 DIAGNOSIS — F03918 Unspecified dementia, unspecified severity, with other behavioral disturbance: Secondary | ICD-10-CM | POA: Diagnosis not present

## 2023-12-06 DIAGNOSIS — Z9181 History of falling: Secondary | ICD-10-CM

## 2023-12-06 DIAGNOSIS — M6281 Muscle weakness (generalized): Secondary | ICD-10-CM | POA: Diagnosis not present

## 2023-12-13 DIAGNOSIS — R2689 Other abnormalities of gait and mobility: Secondary | ICD-10-CM | POA: Diagnosis not present

## 2023-12-13 DIAGNOSIS — H5316 Psychophysical visual disturbances: Secondary | ICD-10-CM | POA: Diagnosis not present

## 2023-12-13 DIAGNOSIS — Z9181 History of falling: Secondary | ICD-10-CM

## 2023-12-13 DIAGNOSIS — M6281 Muscle weakness (generalized): Secondary | ICD-10-CM | POA: Diagnosis not present

## 2023-12-13 DIAGNOSIS — F0390 Unspecified dementia without behavioral disturbance: Secondary | ICD-10-CM | POA: Diagnosis not present

## 2023-12-30 DIAGNOSIS — N390 Urinary tract infection, site not specified: Secondary | ICD-10-CM | POA: Diagnosis not present

## 2023-12-30 DIAGNOSIS — R2689 Other abnormalities of gait and mobility: Secondary | ICD-10-CM | POA: Diagnosis not present

## 2023-12-30 DIAGNOSIS — F0390 Unspecified dementia without behavioral disturbance: Secondary | ICD-10-CM | POA: Diagnosis not present

## 2023-12-30 DIAGNOSIS — Z9181 History of falling: Secondary | ICD-10-CM

## 2023-12-30 DIAGNOSIS — M6281 Muscle weakness (generalized): Secondary | ICD-10-CM | POA: Diagnosis not present

## 2024-01-20 DIAGNOSIS — R634 Abnormal weight loss: Secondary | ICD-10-CM | POA: Diagnosis not present

## 2024-01-20 DIAGNOSIS — F0390 Unspecified dementia without behavioral disturbance: Secondary | ICD-10-CM | POA: Diagnosis not present

## 2024-01-20 DIAGNOSIS — R600 Localized edema: Secondary | ICD-10-CM | POA: Diagnosis not present

## 2024-01-20 DIAGNOSIS — I10 Essential (primary) hypertension: Secondary | ICD-10-CM | POA: Diagnosis not present

## 2024-02-04 DIAGNOSIS — I129 Hypertensive chronic kidney disease with stage 1 through stage 4 chronic kidney disease, or unspecified chronic kidney disease: Secondary | ICD-10-CM | POA: Diagnosis not present

## 2024-02-04 DIAGNOSIS — K219 Gastro-esophageal reflux disease without esophagitis: Secondary | ICD-10-CM

## 2024-02-04 DIAGNOSIS — F03918 Unspecified dementia, unspecified severity, with other behavioral disturbance: Secondary | ICD-10-CM

## 2024-02-04 DIAGNOSIS — E559 Vitamin D deficiency, unspecified: Secondary | ICD-10-CM

## 2024-02-04 DIAGNOSIS — M6281 Muscle weakness (generalized): Secondary | ICD-10-CM

## 2024-02-04 DIAGNOSIS — D649 Anemia, unspecified: Secondary | ICD-10-CM | POA: Diagnosis not present

## 2024-02-04 DIAGNOSIS — N1831 Chronic kidney disease, stage 3a: Secondary | ICD-10-CM | POA: Diagnosis not present

## 2024-02-04 DIAGNOSIS — R600 Localized edema: Secondary | ICD-10-CM | POA: Diagnosis not present

## 2024-02-04 DIAGNOSIS — H5316 Psychophysical visual disturbances: Secondary | ICD-10-CM

## 2024-02-04 DIAGNOSIS — H3530 Unspecified macular degeneration: Secondary | ICD-10-CM

## 2024-02-04 DIAGNOSIS — G4700 Insomnia, unspecified: Secondary | ICD-10-CM

## 2024-02-11 ENCOUNTER — Encounter: Payer: Self-pay | Admitting: Advanced Practice Midwife

## 2024-02-18 DIAGNOSIS — N390 Urinary tract infection, site not specified: Secondary | ICD-10-CM | POA: Diagnosis not present

## 2024-02-18 DIAGNOSIS — Z87440 Personal history of urinary (tract) infections: Secondary | ICD-10-CM

## 2024-02-18 DIAGNOSIS — B9620 Unspecified Escherichia coli [E. coli] as the cause of diseases classified elsewhere: Secondary | ICD-10-CM | POA: Diagnosis not present

## 2024-02-18 DIAGNOSIS — F0390 Unspecified dementia without behavioral disturbance: Secondary | ICD-10-CM | POA: Diagnosis not present

## 2024-02-18 DIAGNOSIS — R531 Weakness: Secondary | ICD-10-CM | POA: Diagnosis not present

## 2024-02-25 DEATH — deceased
